# Patient Record
Sex: Male | Born: 1940 | Race: White | Hispanic: No | Marital: Married | Smoking: Former smoker
Health system: Southern US, Community
[De-identification: ages and names within clinical notes are randomized; demographics above are authoritative.]

## PROBLEM LIST (undated history)

## (undated) DIAGNOSIS — C801 Malignant (primary) neoplasm, unspecified: Secondary | ICD-10-CM

## (undated) DIAGNOSIS — Z860100 Personal history of colon polyps, unspecified: Secondary | ICD-10-CM

## (undated) DIAGNOSIS — T7840XA Allergy, unspecified, initial encounter: Secondary | ICD-10-CM

## (undated) DIAGNOSIS — T8859XA Other complications of anesthesia, initial encounter: Secondary | ICD-10-CM

## (undated) DIAGNOSIS — T4145XA Adverse effect of unspecified anesthetic, initial encounter: Secondary | ICD-10-CM

## (undated) DIAGNOSIS — H269 Unspecified cataract: Secondary | ICD-10-CM

## (undated) DIAGNOSIS — M1990 Unspecified osteoarthritis, unspecified site: Secondary | ICD-10-CM

## (undated) DIAGNOSIS — K219 Gastro-esophageal reflux disease without esophagitis: Secondary | ICD-10-CM

## (undated) DIAGNOSIS — E0590 Thyrotoxicosis, unspecified without thyrotoxic crisis or storm: Secondary | ICD-10-CM

## (undated) DIAGNOSIS — IMO0002 Reserved for concepts with insufficient information to code with codable children: Secondary | ICD-10-CM

## (undated) DIAGNOSIS — M21619 Bunion of unspecified foot: Secondary | ICD-10-CM

## (undated) DIAGNOSIS — M2170 Unequal limb length (acquired), unspecified site: Secondary | ICD-10-CM

## (undated) DIAGNOSIS — E785 Hyperlipidemia, unspecified: Secondary | ICD-10-CM

## (undated) DIAGNOSIS — M7750 Other enthesopathy of unspecified foot: Secondary | ICD-10-CM

## (undated) DIAGNOSIS — M719 Bursopathy, unspecified: Secondary | ICD-10-CM

## (undated) DIAGNOSIS — I10 Essential (primary) hypertension: Secondary | ICD-10-CM

## (undated) DIAGNOSIS — M67919 Unspecified disorder of synovium and tendon, unspecified shoulder: Secondary | ICD-10-CM

## (undated) HISTORY — DX: Other enthesopathy of unspecified foot and ankle: M77.50

## (undated) HISTORY — DX: Bursopathy, unspecified: M71.9

## (undated) HISTORY — PX: JOINT REPLACEMENT: SHX530

## (undated) HISTORY — PX: TONSILLECTOMY: SUR1361

## (undated) HISTORY — DX: Unequal limb length (acquired), unspecified site: M21.70

## (undated) HISTORY — DX: Reserved for concepts with insufficient information to code with codable children: IMO0002

## (undated) HISTORY — DX: Allergy, unspecified, initial encounter: T78.40XA

## (undated) HISTORY — PX: EYE SURGERY: SHX253

## (undated) HISTORY — DX: Bunion of unspecified foot: M21.619

## (undated) HISTORY — DX: Gastro-esophageal reflux disease without esophagitis: K21.9

## (undated) HISTORY — PX: KNEE ARTHROSCOPY: SUR90

## (undated) HISTORY — PX: HERNIA REPAIR: SHX51

## (undated) HISTORY — DX: Unspecified disorder of synovium and tendon, unspecified shoulder: M67.919

## (undated) HISTORY — DX: Hyperlipidemia, unspecified: E78.5

## (undated) HISTORY — PX: APPENDECTOMY: SHX54

## (undated) HISTORY — DX: Unspecified cataract: H26.9

---

## 1979-08-14 DIAGNOSIS — E0590 Thyrotoxicosis, unspecified without thyrotoxic crisis or storm: Secondary | ICD-10-CM

## 1979-08-14 HISTORY — DX: Thyrotoxicosis, unspecified without thyrotoxic crisis or storm: E05.90

## 2002-08-01 ENCOUNTER — Encounter (INDEPENDENT_AMBULATORY_CARE_PROVIDER_SITE_OTHER): Payer: Self-pay | Admitting: *Deleted

## 2002-08-01 ENCOUNTER — Ambulatory Visit (HOSPITAL_COMMUNITY): Admission: RE | Admit: 2002-08-01 | Discharge: 2002-08-01 | Payer: Self-pay | Admitting: Gastroenterology

## 2003-06-20 ENCOUNTER — Encounter: Payer: Self-pay | Admitting: Family Medicine

## 2003-09-30 ENCOUNTER — Encounter (INDEPENDENT_AMBULATORY_CARE_PROVIDER_SITE_OTHER): Payer: Self-pay | Admitting: Specialist

## 2003-09-30 ENCOUNTER — Ambulatory Visit (HOSPITAL_COMMUNITY): Admission: RE | Admit: 2003-09-30 | Discharge: 2003-09-30 | Payer: Self-pay | Admitting: Gastroenterology

## 2006-12-13 HISTORY — PX: PARTIAL COLECTOMY: SHX5273

## 2006-12-13 HISTORY — PX: COLON SURGERY: SHX602

## 2007-02-03 ENCOUNTER — Encounter (INDEPENDENT_AMBULATORY_CARE_PROVIDER_SITE_OTHER): Payer: Self-pay | Admitting: Specialist

## 2007-02-03 ENCOUNTER — Ambulatory Visit (HOSPITAL_COMMUNITY): Admission: RE | Admit: 2007-02-03 | Discharge: 2007-02-03 | Payer: Self-pay | Admitting: Gastroenterology

## 2007-03-31 ENCOUNTER — Inpatient Hospital Stay (HOSPITAL_COMMUNITY): Admission: RE | Admit: 2007-03-31 | Discharge: 2007-04-05 | Payer: Self-pay | Admitting: General Surgery

## 2007-03-31 ENCOUNTER — Encounter (INDEPENDENT_AMBULATORY_CARE_PROVIDER_SITE_OTHER): Payer: Self-pay | Admitting: Specialist

## 2007-10-25 ENCOUNTER — Emergency Department (HOSPITAL_COMMUNITY): Admission: EM | Admit: 2007-10-25 | Discharge: 2007-10-25 | Payer: Self-pay | Admitting: Family Medicine

## 2007-11-04 ENCOUNTER — Emergency Department (HOSPITAL_COMMUNITY): Admission: EM | Admit: 2007-11-04 | Discharge: 2007-11-04 | Payer: Self-pay | Admitting: Family Medicine

## 2007-11-05 ENCOUNTER — Emergency Department (HOSPITAL_COMMUNITY): Admission: EM | Admit: 2007-11-05 | Discharge: 2007-11-05 | Payer: Self-pay | Admitting: Family Medicine

## 2008-01-10 LAB — HM COLONOSCOPY: HM Colonoscopy: NORMAL

## 2008-04-05 ENCOUNTER — Encounter (INDEPENDENT_AMBULATORY_CARE_PROVIDER_SITE_OTHER): Payer: Self-pay | Admitting: Gastroenterology

## 2008-04-05 ENCOUNTER — Ambulatory Visit (HOSPITAL_COMMUNITY): Admission: RE | Admit: 2008-04-05 | Discharge: 2008-04-05 | Payer: Self-pay | Admitting: Gastroenterology

## 2008-09-16 ENCOUNTER — Ambulatory Visit: Payer: Self-pay | Admitting: Sports Medicine

## 2008-09-16 DIAGNOSIS — M21619 Bunion of unspecified foot: Secondary | ICD-10-CM | POA: Insufficient documentation

## 2008-09-16 DIAGNOSIS — M25569 Pain in unspecified knee: Secondary | ICD-10-CM | POA: Insufficient documentation

## 2008-09-16 DIAGNOSIS — M2170 Unequal limb length (acquired), unspecified site: Secondary | ICD-10-CM | POA: Insufficient documentation

## 2008-09-16 HISTORY — DX: Unequal limb length (acquired), unspecified site: M21.70

## 2008-09-16 HISTORY — DX: Bunion of unspecified foot: M21.619

## 2008-10-17 ENCOUNTER — Ambulatory Visit: Payer: Self-pay | Admitting: Sports Medicine

## 2008-10-17 DIAGNOSIS — IMO0002 Reserved for concepts with insufficient information to code with codable children: Secondary | ICD-10-CM | POA: Insufficient documentation

## 2008-10-17 HISTORY — DX: Reserved for concepts with insufficient information to code with codable children: IMO0002

## 2008-11-08 ENCOUNTER — Encounter: Payer: Self-pay | Admitting: Family Medicine

## 2008-12-17 ENCOUNTER — Ambulatory Visit: Payer: Self-pay | Admitting: Sports Medicine

## 2009-02-14 ENCOUNTER — Encounter: Payer: Self-pay | Admitting: Family Medicine

## 2009-05-30 DIAGNOSIS — E785 Hyperlipidemia, unspecified: Secondary | ICD-10-CM

## 2009-05-30 HISTORY — DX: Hyperlipidemia, unspecified: E78.5

## 2009-06-23 ENCOUNTER — Telehealth: Payer: Self-pay | Admitting: Family Medicine

## 2009-08-12 ENCOUNTER — Encounter: Payer: Self-pay | Admitting: Family Medicine

## 2009-08-13 ENCOUNTER — Encounter: Payer: Self-pay | Admitting: Family Medicine

## 2009-09-25 ENCOUNTER — Ambulatory Visit: Payer: Self-pay | Admitting: Family Medicine

## 2009-12-18 ENCOUNTER — Ambulatory Visit: Payer: Self-pay | Admitting: Family Medicine

## 2009-12-25 ENCOUNTER — Ambulatory Visit: Payer: Self-pay | Admitting: Family Medicine

## 2010-01-23 ENCOUNTER — Ambulatory Visit: Payer: Self-pay | Admitting: Family Medicine

## 2010-01-23 DIAGNOSIS — B9789 Other viral agents as the cause of diseases classified elsewhere: Secondary | ICD-10-CM | POA: Insufficient documentation

## 2010-01-27 ENCOUNTER — Ambulatory Visit: Payer: Self-pay | Admitting: Sports Medicine

## 2010-01-27 DIAGNOSIS — M719 Bursopathy, unspecified: Secondary | ICD-10-CM

## 2010-01-27 DIAGNOSIS — M67919 Unspecified disorder of synovium and tendon, unspecified shoulder: Secondary | ICD-10-CM | POA: Insufficient documentation

## 2010-01-27 DIAGNOSIS — M7750 Other enthesopathy of unspecified foot: Secondary | ICD-10-CM

## 2010-01-27 HISTORY — DX: Unspecified disorder of synovium and tendon, unspecified shoulder: M67.919

## 2010-01-27 HISTORY — DX: Other enthesopathy of unspecified foot and ankle: M77.50

## 2010-03-10 ENCOUNTER — Ambulatory Visit: Payer: Self-pay | Admitting: Sports Medicine

## 2010-06-23 ENCOUNTER — Ambulatory Visit: Payer: Self-pay | Admitting: Family Medicine

## 2010-06-30 LAB — CONVERTED CEMR LAB
Cholesterol: 165 mg/dL (ref 0–200)
HDL: 47.1 mg/dL (ref >39.00)
LDL Cholesterol: 90 mg/dL (ref 0–99)
Total CHOL/HDL Ratio: 4
Triglycerides: 138 mg/dL (ref 0.0–149.0)
VLDL: 27.6 mg/dL (ref 0.0–40.0)

## 2010-09-17 ENCOUNTER — Encounter: Payer: Self-pay | Admitting: Family Medicine

## 2010-09-24 ENCOUNTER — Encounter: Payer: Self-pay | Admitting: Family Medicine

## 2010-12-13 HISTORY — PX: HAMMER TOE SURGERY: SHX385

## 2010-12-29 ENCOUNTER — Ambulatory Visit
Admission: RE | Admit: 2010-12-29 | Discharge: 2010-12-29 | Payer: Self-pay | Source: Home / Self Care | Attending: Family Medicine | Admitting: Family Medicine

## 2010-12-29 ENCOUNTER — Other Ambulatory Visit: Payer: Self-pay | Admitting: Family Medicine

## 2010-12-29 DIAGNOSIS — K219 Gastro-esophageal reflux disease without esophagitis: Secondary | ICD-10-CM | POA: Insufficient documentation

## 2010-12-29 HISTORY — DX: Gastro-esophageal reflux disease without esophagitis: K21.9

## 2010-12-29 LAB — HEPATIC FUNCTION PANEL
ALT: 33 U/L (ref 0–53)
AST: 28 U/L (ref 0–37)
Albumin: 4.3 g/dL (ref 3.5–5.2)
Alkaline Phosphatase: 72 U/L (ref 39–117)
Bilirubin, Direct: 0.2 mg/dL (ref 0.0–0.3)
Total Bilirubin: 1.1 mg/dL (ref 0.3–1.2)
Total Protein: 7.4 g/dL (ref 6.0–8.3)

## 2010-12-29 LAB — LIPID PANEL
Cholesterol: 182 mg/dL (ref 0–200)
HDL: 47.2 mg/dL (ref >39.00)
LDL Cholesterol: 108 mg/dL — ABNORMAL HIGH (ref 0–99)
Total CHOL/HDL Ratio: 4
Triglycerides: 136 mg/dL (ref 0.0–149.0)
VLDL: 27.2 mg/dL (ref 0.0–40.0)

## 2010-12-29 LAB — CONVERTED CEMR LAB
Cholesterol, target level: 200 mg/dL
HDL goal, serum: 40 mg/dL
LDL Goal: 160 mg/dL

## 2010-12-29 LAB — BASIC METABOLIC PANEL
BUN: 16 mg/dL (ref 6–23)
CO2: 28 mEq/L (ref 19–32)
Calcium: 9.4 mg/dL (ref 8.4–10.5)
Chloride: 105 mEq/L (ref 96–112)
Creatinine, Ser: 0.8 mg/dL (ref 0.4–1.5)
GFR: 104.96 mL/min (ref >60.00)
Glucose, Bld: 96 mg/dL (ref 70–99)
Potassium: 4.4 mEq/L (ref 3.5–5.1)
Sodium: 142 mEq/L (ref 135–145)

## 2010-12-29 LAB — PSA: PSA: 1.35 ng/mL (ref 0.10–4.00)

## 2011-01-10 LAB — CONVERTED CEMR LAB
ALT: 30 units/L (ref 0–53)
AST: 34 units/L (ref 0–37)
Albumin: 4 g/dL (ref 3.5–5.2)
Alkaline Phosphatase: 71 units/L (ref 39–117)
BUN: 15 mg/dL (ref 6–23)
Basophils Absolute: 0 10*3/uL (ref 0.0–0.1)
Basophils Relative: 0.9 % (ref 0.0–3.0)
Bilirubin Urine: NEGATIVE
Bilirubin, Direct: 0 mg/dL (ref 0.0–0.3)
Blood in Urine, dipstick: NEGATIVE
CO2: 30 meq/L (ref 19–32)
Calcium: 9.3 mg/dL (ref 8.4–10.5)
Chloride: 108 meq/L (ref 96–112)
Cholesterol: 173 mg/dL (ref 0–200)
Creatinine, Ser: 0.9 mg/dL (ref 0.4–1.5)
Eosinophils Absolute: 0.2 10*3/uL (ref 0.0–0.7)
Eosinophils Relative: 4.9 % (ref 0.0–5.0)
GFR calc non Af Amer: 89.26 mL/min (ref >60)
Glucose, Bld: 102 mg/dL — ABNORMAL HIGH (ref 70–99)
Glucose, Urine, Semiquant: NEGATIVE
HCT: 46.6 % (ref 39.0–52.0)
HDL: 51.6 mg/dL (ref >39.00)
Hemoglobin: 15.1 g/dL (ref 13.0–17.0)
Ketones, urine, test strip: NEGATIVE
LDL Cholesterol: 108 mg/dL — ABNORMAL HIGH (ref 0–99)
Lymphocytes Relative: 31.1 % (ref 12.0–46.0)
Lymphs Abs: 1.5 10*3/uL (ref 0.7–4.0)
MCHC: 32.4 g/dL (ref 30.0–36.0)
MCV: 96.2 fL (ref 78.0–100.0)
Monocytes Absolute: 0.5 10*3/uL (ref 0.1–1.0)
Monocytes Relative: 11 % (ref 3.0–12.0)
Neutro Abs: 2.6 10*3/uL (ref 1.4–7.7)
Neutrophils Relative %: 52.1 % (ref 43.0–77.0)
Nitrite: NEGATIVE
PSA: 0.7 ng/mL (ref 0.10–4.00)
Platelets: 162 10*3/uL (ref 150.0–400.0)
Potassium: 5 meq/L (ref 3.5–5.1)
RBC: 4.84 M/uL (ref 4.22–5.81)
RDW: 12.9 % (ref 11.5–14.6)
Sodium: 142 meq/L (ref 135–145)
Specific Gravity, Urine: 1.015
TSH: 2.23 microintl units/mL (ref 0.35–5.50)
Total Bilirubin: 1 mg/dL (ref 0.3–1.2)
Total CHOL/HDL Ratio: 3
Total Protein: 7.1 g/dL (ref 6.0–8.3)
Triglycerides: 67 mg/dL (ref 0.0–149.0)
Urobilinogen, UA: 0.2
VLDL: 13.4 mg/dL (ref 0.0–40.0)
WBC Urine, dipstick: NEGATIVE
WBC: 4.8 10*3/uL (ref 4.5–10.5)
pH: 7

## 2011-01-12 NOTE — Consult Note (Signed)
Summary: [facility]  [facility]   Imported By: Maryln Gottron 09/24/2010 14:55:44  _____________________________________________________________________  External Attachment:    Type:   Image     Comment:   External Document

## 2011-01-12 NOTE — Assessment & Plan Note (Signed)
Summary: ORTHOTICS AND SHOULDER/MJD   Vital Signs:  Patient profile:   70 year old male BP sitting:   158 / 83  Vitals Entered By: Lillia Pauls CMA (March 10, 2010 9:22 AM)  History of Present Illness: Pt presents for follow-up of bilateral metatarsalgia under the 2nd and 3rd toes in addition to his right rotator cuff tendinitis. Overall, he has done very well with the metatarsal pads in his loafers and is no longer having significant pain. However, he likes to go to the gym 2-4 times per week where he mainly does walking and elliptical work. He does get his metatarsalgia symptoms at times during his work-outs and is here for custom orthotic fabrication.  His right shoulder pain has also improved. He does not have to take regular mediations and is able to do most of the rehab exercises without difficulty. He still has some pain with putting his right hand behind his back. He does not have to take regular medications for pain. He can sleep comfortably.  Allergies: 1)  ! Codeine Sulfate (Codeine Sulfate)  Physical Exam  General:  alert and well-developed.   Head:  normocephalic and atraumatic.   Neck:  supple.   Lungs:  normal respiratory effort.   Msk:  RIght Shoulder: Normal inspection Full ROM with forward flexion and abduction Neg drop arm sign Neg empty can, cross over, Neer's and Speed's test + Hawkin's test No TTP throughout 5/5 strength with resisted internal and external rotation Can put hand behind back to L3  Left Shoulder: Normal inspection full ROM with forward flexion and abduction No TTP throughout Neg special testing 5/5 strength with resisted internal and external rotation Can put hand behind back to T12  Feet: Bunion on left greater than right Bilateral bunionettes Pes cavus with midfoot collapse bilaterally Transverse arch breakdown Prominant calluses below 2nd and 3rd toes bilaterally with palpable MT heads under calluses   Impression &  Recommendations:  Problem # 1:  METATARSALGIA (ICD-726.70) Assessment Improved  Improved with medium MT pads in loafers Fitted for custom orthotics today to help with metatarsalgia and bunions  Patient was fitted for a : standard, cushioned, semi-rigid orthotic. The orthotic was heated and afterward the patient stood on the orthotic blank positioned on the orthotic stand. The patient was positioned in subtalar neutral position and 10 degrees of ankle dorsiflexion in a weight bearing stance. After completion of molding, a stable base was applied to the orthotic blank. The blank was ground to a stable position for weight bearing. 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 One tab every other day  (trial 6 months) 2)  Nexium 40 Mg Cpdr (Esomeprazole magnesium) .... Once daily 3)  Aspirin 81 Mg Tabs (Aspirin) .... Once daily 4)  Daily Multiple Vitamins Tabs (Multiple vitamin) .... Once daily  Appended Document: Preload-Flu Vaccine     Immunization History:  Influenza Immunization History:    Influenza:  historical (09/11/2010)    Immunization History:  Influenza Immunization History:    Influenza:  historical (09/11/2010)

## 2011-01-12 NOTE — Assessment & Plan Note (Signed)
Summary: SHOULDER/FEET [facility]IN[facility]   Vital Signs:  Patient profile:   70 year old male BP sitting:   155 / 90  Vitals Entered By: Lillia Pauls CMA (January 27, 2010 10:22 AM)  History of Present Illness: Pt presents with right shoulder pain and bilateral foot pain since September of 2010. He retired back in April of 2010 and joined [employer] where he does multiple fitness classes per week in which he uses light dumbells during aerobic training. He has had right anterior shoulder pain now when he reaches behind his back. The pain that occurs shoots down his right biceps. The pain is not usually there except when he uses the dumbells with his arms fully extended or when he reaches behind his back. He has not used any medications or heat for this pain.  He is also having bilateral foot pain underneath his 2nd and 3rd toes bilaterally. He has been given sports insoles in the past with metatarsal pads which are very comfortable. He has those inserts in his work-out shoes. However, he usually wears loafers during the day which are fairly comfortable. His pain has been returning though and he is hoping to remain active as comfortably as possible.   Allergies: 1)  ! Codeine Sulfate (Codeine Sulfate)  Physical Exam  General:  alert and well-developed.   Head:  normocephalic and atraumatic.   Neck:  supple.   Lungs:  normal respiratory effort.   Msk:  Right Shoulder: No bony abnormalities, edema or bruising Full ROM with forward flexion and abduction Pain with reaching behind his back but can get to L4 Neg empty can, cross over, Hawkin's, Neer's  Slightly positive speeds + TTP over the biceps tendon and along anterior shoulder 5/5 strength with resisted internal and external rotation  Left Shoulder: No bony abnormalities, edema or bruising Full ROM with forward flexion and abduction Can reach easily behind his back to L1 Neg empty can, cross over, Hawkin's, Neer's, Speed's  No TTP  throughout 5/5 strength with resisted internal and external rotation  Feet: Bunion on left greater than right Bilateral bunionettes Pes cavus with midfoot collapse bilaterally Transverse arch breakdown Prominant calluses below 2nd and 3rd toes bilaterally with palpable MT heads under calluses   Impression & Recommendations:  Problem # 1:  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 Given multiple exercises to do daily to help with pain from rotator cuff tendinitis 2. Can ice shoulder at the end of the day as needed for 20 minutes 3. Can use OTC NSAID as needed for pain 4. Return in 4 weeks for follow-up  Problem # 2:  METATARSALGIA (ICD-726.70) Likley cause of his bilateral foot pain 1. Placed medium MT pads in his bilataral dress shoes today in the office which he felt were comfortable 2. Would bring him back for custom orthotics since he is working out on a regular basis and developing foot pain more regularly  Problem # 3:  BUNION, LEFT FOOT (ICD-727.1) 1. Suggesting custom orthotics to prevent worsening of his left bunion  Complete Medication List: 1)  Lipitor 20 Mg Tabs (Atorvastatin calcium) .... One tab every other day  (trial 6 months) 2)  Nexium 40 Mg Cpdr (Esomeprazole magnesium) .... Once daily 3)  Aspirin 81 Mg Tabs (Aspirin) .... Once daily 4)  Daily Multiple Vitamins Tabs (Multiple vitamin) .... Once daily

## 2011-01-12 NOTE — Letter (Signed)
Summary: [facility]  [facility]   Imported By: Maryln Gottron 10/07/2010 10:38:11  _____________________________________________________________________  External Attachment:    Type:   Image     Comment:   External Document

## 2011-01-12 NOTE — Assessment & Plan Note (Signed)
Summary: cpx/njr  [facility] BMP/NJR   Vital Signs:  Patient profile:   70 year old male Height:      70.5 inches Weight:      183 pounds Temp:     98.4 degrees F oral Pulse rate:   80 / minute Pulse rhythm:   regular BP sitting:   140 / 82  (left arm) Cuff size:   regular  Vitals Entered By: Sid Falcon LPN (December 25, 2009 10:26 AM) CC: CPX, labs done   History of Present Illness: Patient here for complete physical examination. Has hyperlipidemia and GERD history. Symptoms well-controlled.   Had colonoscopy 2008 and due for repeat in 2 years.  Has received flu vaccine. Pneumovax up-to-date. Tetanus up to date. Exercising 4 days per week.  Some weight loss this past year due to his efforts.  History of metatarsalgia. He has orthotic inserts which are helping some. Family history and social history reviewed. Patient is retired from [employer].  Allergies: 1)  ! Codeine Sulfate (Codeine Sulfate)  Past History:  Past Surgical History: Last updated: 09/25/2009 Colonectomy 2008  Family History: Last updated: 12/25/2009 Family History of Colon CA  Father in his 8s. 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 Family History High cholesterol Family History of Prostate CA   Review of Systems  The patient denies anorexia, fever, weight loss, weight gain, vision loss, decreased hearing, hoarseness, chest pain, syncope, dyspnea on exertion, peripheral edema, prolonged cough, headaches, hemoptysis, abdominal pain, melena, hematochezia, severe indigestion/heartburn, hematuria, incontinence, muscle  weakness, suspicious skin lesions, transient blindness, difficulty walking, depression, unusual weight change, abnormal bleeding, enlarged lymph nodes, and testicular masses.    Physical Exam  General:  Well-developed,well-nourished,in no acute distress; alert,appropriate and cooperative throughout examination Head:  Normocephalic and atraumatic without obvious abnormalities. No apparent alopecia or balding. Eyes:  No corneal or conjunctival inflammation noted. EOMI. Perrla. Funduscopic exam benign, without hemorrhages, exudates or papilledema. Vision grossly normal. Ears:  External ear exam shows no significant lesions or deformities.  Otoscopic examination reveals clear canals, tympanic membranes are intact bilaterally without bulging, retraction, inflammation or discharge. Hearing is grossly normal bilaterally. Nose:  External nasal examination shows no deformity or inflammation. Nasal mucosa are pink and moist without lesions or exudates. Mouth:  Oral mucosa and oropharynx without lesions or exudates.  Teeth in good repair. Neck:  No deformities, masses, or tenderness noted. Lungs:  Normal respiratory effort, chest expands symmetrically. Lungs are clear to auscultation, no crackles or wheezes. Heart:  Normal rate and regular rhythm. S1 and S2 normal without gallop, murmur, click, rub or other extra sounds. Abdomen:  Bowel sounds positive,abdomen soft and non-tender without masses, organomegaly or hernias noted. Rectal:  No external abnormalities noted. Normal sphincter tone. No rectal masses or tenderness. Prostate:  Prostate gland firm and smooth, no enlargement, nodularity, tenderness, mass, asymmetry or induration. Msk:  No deformity or scoliosis noted of thoracic or lumbar spine.   Extremities:  No clubbing, cyanosis, edema, or deformity noted with normal full range of motion of all joints.   Neurologic:  No cranial nerve deficits noted. Station and gait are normal. Plantar reflexes are  down-going bilaterally.  DTRs are symmetrical throughout. Sensory, motor and coordinative functions appear intact. Skin:  Intact without suspicious lesions or rashes   Impression & Recommendations:  Problem # 1:  Preventive Health Care (ICD-V70.0) continue regular exercise. Labs reviewed with patient including lipoprotein profile. At this point he'll continue Lipitor and baby aspirin one daily  Complete Medication List: 1)  Lipitor 20 Mg Tabs (Atorvastatin calcium) .... Once daily 2)  Nexium 40 Mg Cpdr (Esomeprazole magnesium) .... Once daily 3)  Aspirin 81 Mg Tabs (Aspirin) .... Once daily 4)  Daily Multiple Vitamins Tabs (Multiple vitamin) .... Once daily  Patient Instructions: 1)  It is important that you exercise reguarly at least 20 minutes 5 times a week. If you develop chest pain, have severe difficulty breathing, or feel very tired, stop exercising immediately and seek medical attention.  2)  Check your  Blood Pressure regularly . If it is above: 140/90  you should make an appointment. Prescriptions: LIPITOR 20 MG TABS (ATORVASTATIN CALCIUM) once daily  #30 x 11   Entered and Authorized by:   Evelena Peat MD   Signed by:   Evelena Peat MD on 12/25/2009   Method used:   Electronically to        [facility]* (retail)       [address] [place], [place]  [address]       Ph: [phone] or [phone]       Fax: [phone]   RxID:   [id number]    Immunization History:  Influenza Immunization History:    Influenza:  historical (09/12/2008)

## 2011-01-12 NOTE — Assessment & Plan Note (Signed)
Summary: fever and URI/dm   Vital Signs:  Patient profile:   70 year old male Temp:     98.4 degrees F oral BP sitting:   140 / 80  (left arm) Cuff size:   regular  Vitals Entered By: Sid Falcon LPN (January 23, 2010 11:54 AM) CC: Fever, body aches, congestion X 2 days   History of Present Illness: Acute visit. Onset Wednesday night of fevers up to 102 and diffuse body aches. Started with nasal congestion now has some chest congestion as well with mostly nonproductive cough. Denies any nausea, vomiting, or diarrhea. No significant headaches. Intermittent chills. Had flu vaccine.  Allergies: 1)  ! Codeine Sulfate (Codeine Sulfate)  Past History:  Past Medical History: Last updated: 12/25/2009 GERD (on nexium) Dyslipidemia (on lipitor) Seasonal allergies Adenomatous colon polyps Thyroiditis PMH reviewed for relevance  Review of Systems      See HPI  Physical Exam  General:  Well-developed,well-nourished,in no acute distress; alert,appropriate and cooperative throughout examination Ears:  External ear exam shows no significant lesions or deformities.  Otoscopic examination reveals clear canals, tympanic membranes are intact bilaterally without bulging, retraction, inflammation or discharge. Hearing is grossly normal bilaterally. Nose:  External nasal examination shows no deformity or inflammation. Nasal mucosa are pink and moist without lesions or exudates. Mouth:  Oral mucosa and oropharynx without lesions or exudates.  Teeth in good repair. Neck:  No deformities, masses, or tenderness noted. Lungs:  Normal respiratory effort, chest expands symmetrically. Lungs are clear to auscultation, no crackles or wheezes. Heart:  normal rate, regular rhythm, and no murmur.   Skin:  no rash   Impression & Recommendations:  Problem # 1:  VIRAL INFECTION (ICD-079.99) treat symptomatically. Continued Advil. Patient will also add Mucinex and over-the-counter Sudafed for nasal  congestive symptoms His updated medication list for this problem includes:    Aspirin 81 Mg Tabs (Aspirin) ..... Once daily  Complete Medication List: 1)  Lipitor 20 Mg Tabs (Atorvastatin calcium) .... One tab every other day  (trial 6 months) 2)  Nexium 40 Mg Cpdr (Esomeprazole magnesium) .... Once daily 3)  Aspirin 81 Mg Tabs (Aspirin) .... Once daily 4)  Daily Multiple Vitamins Tabs (Multiple vitamin) .... Once daily   Immunization History:  Influenza Immunization History:    Influenza:  historical (09/12/2009)

## 2011-01-14 NOTE — Assessment & Plan Note (Signed)
Summary: emp---will fast//ccm   Vital Signs:  Patient profile:   70 year old male Height:      70.5 inches Weight:      186 pounds BMI:     26.41 Temp:     97.7 degrees F oral Pulse rate:   72 / minute Pulse rhythm:   regular Resp:     12 per minute BP sitting:   120 / 90  (left arm) Cuff size:   regular  Vitals Entered By: Sid Falcon LPN (December 29, 2010 8:54 AM)  Nutrition Counseling: Patient's BMI is greater than 25 and therefore counseled on weight management options.  History of Present Illness: Here for CPE-Medicare wellness exam and follow up medical problems.  Here for Medicare AWV:  1.   Risk factors based on Past M, S, F history:  hx GERD, hyperlipidemia, adenomatous colon polyps.  Nonsmoker and no signif ETOH use.  FH father colon cancer and prostate cancer. 2.   Physical Activities: exercises regularly. 3.   Depression/mood: no depression or anxiety issues 4.   Hearing: no major defecits. 5.   ADL's: Fully independent. 6.   Fall Risk: very low.  Good balance.  No major orthopedic impairments.  no orthostasis or visual problems. 7.   Home Safety: no issues identified. 8.   Height, weight, &visual acuity:  Ht, WT, and vision stable. 9.   Counseling: continued exercise.  Immunization needs reviewed and up to date. 10.   Labs ordered based on risk factors: lipid, hepatic, BMP, PSA. 11.           Referral Coordination  none needed at this time. 12.           Care Plan  repeat colonoscopy later this year.  Labs as above.  immunizations up to date. 13.            Cognitive Assessment  No impairment in short or long term memory.  No impairment in decision making or judgement.   GERD symptoms stable.  would like to change from Nexium to generic.  No dysphagia.  Lipid Management History:      Positive NCEP/ATP III risk factors include male age 13 years old or older.  Negative NCEP/ATP III risk factors include non-diabetic, non-tobacco-user status, non-hypertensive, no  ASHD (atherosclerotic heart disease), no prior stroke/TIA, no peripheral vascular disease, and no history of aortic aneurysm.     Preventive Screening-Counseling & Management  Alcohol-Tobacco     Smoking Status: never  Clinical Review Panels:  Prevention   Last Colonoscopy:  normal (12/14/2007)   Last PSA:  0.70 (12/18/2009)  Immunizations   Last Tetanus Booster:  Historical (03/13/2006)   Last Flu Vaccine:  Historical (09/11/2010)   Last Pneumovax:  Historical (12/13/2006)   Last Zoster Vaccine:  Zostavax (12/13/2004)   Allergies: 1)  ! Codeine Sulfate (Codeine Sulfate)  Past History:  Past Medical History: Last updated: 12/25/2009 GERD (on nexium) Dyslipidemia (on lipitor) Seasonal allergies Adenomatous colon polyps Thyroiditis  Family History: Last updated: 12/29/2010 Family History of Colon CA  Father in his 1s. 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 Family History High cholesterol Family History of Prostate CA  Father  Social History: Smoking Status:  never  Review of Systems  The patient denies anorexia, fever, weight loss, weight gain, vision loss, decreased hearing, hoarseness, chest pain, syncope, dyspnea on exertion, peripheral edema, prolonged cough, headaches, hemoptysis, abdominal pain, melena, hematochezia, severe indigestion/heartburn, hematuria, incontinence, genital sores, muscle weakness, suspicious skin lesions, transient blindness, difficulty walking, depression, unusual weight change, abnormal bleeding, enlarged lymph nodes, and testicular masses.    Physical Exam  General:   Well-developed,well-nourished,in no acute distress; alert,appropriate and cooperative throughout examination Head:  Normocephalic and atraumatic without obvious abnormalities.  Eyes:  pupils equal, pupils round, and pupils reactive to light.   Ears:  External ear exam shows no significant lesions or deformities.  Otoscopic examination reveals clear canals, tympanic membranes are intact bilaterally without bulging, retraction, inflammation or discharge. Hearing is grossly normal bilaterally. Mouth:  Oral mucosa and oropharynx without lesions or exudates.  Teeth in good repair. Neck:  No deformities, masses, or tenderness noted. Lungs:  Normal respiratory effort, chest expands symmetrically. Lungs are clear to auscultation, no crackles or wheezes. Heart:  Normal rate and regular rhythm. S1 and S2 normal without gallop, murmur, click, rub or other extra sounds. Abdomen:  Bowel sounds positive,abdomen soft and non-tender without masses, organomegaly or hernias noted. Rectal:  No external abnormalities noted. Normal sphincter tone. No rectal masses or tenderness. Prostate:  Prostate gland firm and smooth, no enlargement, nodularity, tenderness, mass, asymmetry or induration. Msk:  No deformity or scoliosis noted of thoracic or lumbar spine.   Extremities:  Walking shoe R foot from recent hammer toe surgery. Neurologic:  No cranial nerve deficits noted. Station and gait are normal. Plantar reflexes are down-going bilaterally. DTRs are symmetrical throughout. Sensory, motor and coordinative functions appear intact. Skin:  no rashes and no suspicious lesions.   Cervical Nodes:  No lymphadenopathy noted Psych:  Cognition and judgment appear intact. Alert and cooperative with normal attention span and concentration. No apparent delusions, illusions, hallucinations   Impression & Recommendations:  Problem # 1:  ROUTINE GENERAL MEDICAL EXAM@HEALTH  CARE FACL (ICD-V70.0)  Orders: TLB-PSA (Prostate Specific  Antigen) (84153-PSA) TLB-BMP (Basic Metabolic Panel-BMET) (80048-METABOL) Specimen Handling ([address]) Venipuncture ([address]) Medicare -1st Annual Wellness Visit [phone])  Problem # 2:  HYPERLIPIDEMIA (ICD-272.4)  His updated medication list for this problem includes:    Lipitor 20 Mg Tabs (Atorvastatin calcium) ..... One tab every other day  (trial 6 months)  Orders: TLB-Lipid Panel (80061-LIPID) TLB-Hepatic/Liver Function Pnl (80076-HEPATIC) Specimen Handling (91478) Venipuncture (29562)  Problem # 3:  GERD (ICD-530.81)  His updated medication list for this problem includes:    Pantoprazole Sodium 40 Mg Tbec (Pantoprazole sodium) ..... One by mouth once daily  Complete Medication List: 1)  Lipitor 20 Mg Tabs (Atorvastatin calcium) .... One tab every other day  (trial 6 months) 2)  Pantoprazole Sodium 40 Mg Tbec (Pantoprazole sodium) .... One by mouth once daily 3)  Aspirin 81 Mg Tabs (Aspirin) .... Once daily 4)  Daily Multiple Vitamins Tabs (Multiple vitamin) .... Once daily 5)  Gnp Fish Oil 1200 Mg Cpdr (Omega-3 fatty acids) .... 2 tabs daily 6)  Ocuvite Adult 50+ Caps (Multiple vitamins-minerals) .... Once daily  Lipid Assessment/Plan:      Based on NCEP/ATP III, the patient's risk factor category is "0-1 risk factors".  The patient's lipid goals are as follows: Total cholesterol goal is 200; LDL cholesterol goal is 160; HDL cholesterol goal is 40; Triglyceride goal is 150.    Patient Instructions: 1)  Please schedule a  follow-up appointment in 1 year.  2)  It is important that you exercise reguarly at least 20 minutes 5 times a week. If you develop chest pain, have severe difficulty breathing, or feel very tired, stop exercising immediately and seek medical attention.  Prescriptions: LIPITOR 20 MG TABS (ATORVASTATIN CALCIUM) one tab every other day  (trial 6 months)  #30 x 3   Entered and Authorized by:   Evelena Peat MD   Signed by:   Evelena Peat MD on 12/29/2010    Method used:   Electronically to        [facility] [phone]* (retail)       [address] [place], [place]  [address]       Ph: [phone]       Fax: [phone]   RxID:   [id number] PANTOPRAZOLE SODIUM 40 MG TBEC (PANTOPRAZOLE SODIUM) one by mouth once daily  #30 x 11   Entered and Authorized by:   Evelena Peat MD   Signed by:   Evelena Peat MD on 12/29/2010   Method used:   Electronically to        [facility] [address] [phone]* (retail)       [address] [place], [place]  [address]       Ph: [phone]       Fax: [phone]   RxID:   [id number]     Orders Added: 1)  TLB-Lipid Panel [80061-LIPID] 2)  TLB-Hepatic/Liver Function Pnl [80076-HEPATIC] 3)  TLB-PSA (Prostate Specific Antigen) [84153-PSA] 4)  TLB-BMP (Basic Metabolic Panel-BMET) [80048-METABOL] 5)  Specimen Handling [99000] 6)  Venipuncture [32355] 7)  Medicare -1st Annual Wellness Visit [G0438] 8)  Est. Patient Level III [73220]

## 2011-02-18 ENCOUNTER — Other Ambulatory Visit: Payer: Self-pay | Admitting: Family Medicine

## 2011-02-18 DIAGNOSIS — E785 Hyperlipidemia, unspecified: Secondary | ICD-10-CM

## 2011-02-19 ENCOUNTER — Telehealth: Payer: Self-pay | Admitting: Family Medicine

## 2011-02-19 NOTE — Telephone Encounter (Signed)
Discussed med refills

## 2011-02-19 NOTE — Telephone Encounter (Signed)
Pt called to speak with Nurse ref medication.... Would not elaborate what the discrepancy was just that he wanted to speak with Dr Mar Daring nurse.... Pt can be reached at [phone].

## 2011-04-27 NOTE — Op Note (Signed)
NAME:  AKRAM, KISSICK NO.:  [id number]   MEDICAL RECORD NO.:  [id number]          PATIENT TYPE:  AMB   LOCATION:  ENDO                         FACILITY:  [facility]   PHYSICIAN:  Anselmo Rod, M.D.  DATE OF BIRTH:  1941-10-16   DATE OF PROCEDURE:  04/05/2008  DATE OF DISCHARGE:                               OPERATIVE REPORT   PROCEDURE:  Colonoscopy with cold biopsies x 2.   ENDOSCOPIST:  Anselmo Rod, M.D.   INSTRUMENT USED:  Pentax video colonoscope.   INDICATIONS FOR PROCEDURE:  A 70 year old white male with a history of  colon cancer in his father diagnosed in his late 44s and a personal  history of adenomatous polyps. 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   PREPROCEDURE PREPARATION:  Informed consent was procured from the  patient. The patient was fasted for 4 hours prior to the procedure and  prepped with 20 OsmoPrep pills the night of and 12 OsmoPrep pills the  morning of the procedure.  The risks and benefits of the procedure  including a 10% missed rate of cancer and polyp were discussed with the  patient as well.   PREPROCEDURE PHYSICAL:  VITAL SIGNS:  Stable.  NECK:  Supple.  CHEST:  Clear to auscultation.  CARDIAC:  S1 and S2 regular.  ABDOMEN:  Soft with normal bowel sounds.   DESCRIPTION OF PROCEDURE:  The patient was placed in the left lateral  decubitus position and sedated with 80 mcg of Fentanyl and 8 mg of  Versed given intravenously in slow incremental doses. Once the patient  was adequately sedated and maintained on low-flow oxygen and continuous  cardiac monitoring, the Pentax video colonoscope was advanced from the  rectum to the cecum.  Two small sessile polyps were removed from 80 cm  by cold biopsies x2.  Some nodular changes were noted at the  anastomosis, and biopsies of this area were done as well.  Sigmoid  diverticulosis was appreciated.  Retroflexion in the rectum revealed  small internal hemorrhoids.  The patient tolerated the procedure well  without immediate complications.   IMPRESSION:  1. Two small sessile polyps biopsied from the left colon at 80 cm.  2. Nodular mucosa biopsied from the anastomosis.  3. Patient status post right hemicolectomy.  Distal small bowel      appeared normal.  4. Sigmoid diverticulosis.  5. Small internal hemorrhoids.   RECOMMENDATIONS:  1. Await pathology results.  2. Repeat colonoscopy depending on pathology results.  3. Avoid all nonsteroidals including aspirin for the next 2 weeks.  4. High-fiber diet with liberal fluid intake has been advocated.  5. Outpatient followup as need arises in the future.      Anselmo Rod, M.D.  Electronically Signed     JNM/MEDQ  D:  04/05/2008  T:  04/05/2008  Job:  [id number]   cc:   Evelena Peat, M.D.   Adolph Pollack, M.D.  [address]  [place]  [place] [address]

## 2011-04-30 NOTE — Op Note (Signed)
NAME:  CAMILO, MANDER NO.:  [id number]   MEDICAL RECORD NO.:  [id number]          PATIENT TYPE:  AMB   LOCATION:  ENDO                         FACILITY:  [facility]   PHYSICIAN:  Anselmo Rod, M.D.  DATE OF BIRTH:  27-Feb-1941   DATE OF PROCEDURE:  02/03/2007  DATE OF DISCHARGE:                               OPERATIVE REPORT   PROCEDURE PERFORMED:  Esophagogastroduodenoscopy with multiple cold  biopsies.   ENDOSCOPIST:  Anselmo Rod, MD   INSTRUMENT USED:  Pentax video panendoscope.   INDICATIONS FOR PROCEDURE:  A 70 year old white male with a history of  severe reflux and ongoing epigastric discomfort not responding to  Prilosec, undergoing EGD to rule out peptic ulcer disease, esophagitis,  gastritis, etc.   PREPROCEDURE PREPARATION:  Informed consent was procured from the  patient.  The patient was fasted for 4 hours prior to the procedure.  Risks and benefits of the procedure were discussed with the patient in  great detail.   PREPROCEDURE PHYSICAL:  VITAL SIGNS:  The patient had stable vital  signs.  NECK:  Supple.  CHEST:  Clear to auscultation.  S1 and S2 regular.  ABDOMEN:  Soft with normal bowel sounds.   DESCRIPTION OF PROCEDURE:  The patient was placed in the left lateral  decubitus position and sedated with 50 mcg of fentanyl and 6 mg of  Versed given intravenously in slow incremental doses. Once the patient  was adequately sedated and maintained on low-flow oxygen and continuous  cardiac monitoring, the Pentax video panendoscope was advanced through  the mouthpiece, over the tongue and into the esophagus under direct  vision. The entire esophagus was widely patent with no evidence of ring,  stricture, masses, or esophagitis. A small patch of pinkish mucosa was  seen above the Z-line and was biopsied to rule out Barrett's mucosa. The  scope was then advanced into the stomach and a 3- to 4-cm hiatal hernia  was seen on high retroflexion.   Antral gastritis was noted.  Antral  biopsies were done to rule out presence of H. pylori by Pathology.  The  proximal small bowel appeared normal.  There was no obstruction.  The  patient tolerated the procedure well without immediate complications.   IMPRESSION:  1.Normal-appearing esophagus except for a small patch of  pinkish mucosa biopsied above the Z-line to rule out Barrett's mucosa.  2.Antral gastritis, biopsies done for Helicobacter pylori.  3.A 3- to 4-cm hiatal hernia appreciated on high retroflexion.  4.Normal proximal small bowel.   RECOMMENDATIONS:  1.Await pathology results.  2.The patient has been given a prescription for Nexium to take 40 mg one  p.o. q.a.m.  3.Avoid all nonsteroidals for now.  4.Treat with antibiotics if H. pylori is present on biopsies.  5.Outpatient followup in the next 2 weeks or earlier if need be.      Anselmo Rod, M.D.  Electronically Signed     JNM/MEDQ  D:  02/03/2007  T:  02/03/2007  Job:  [id number]   cc:   Evelena Peat, M.D.  Adolph Pollack, M.D.

## 2011-04-30 NOTE — Op Note (Signed)
NAME:  Hector Lewis, Hector Lewis NO.:  [id number]   MEDICAL RECORD NO.:  [id number]                   PATIENT TYPE:  AMB   LOCATION:  ENDO                                 FACILITY:  [facility]   PHYSICIAN:  Charna Elizabeth, M.D.                   DATE OF BIRTH:  03/24/1941   DATE OF PROCEDURE:  08/01/2002  DATE OF DISCHARGE:                                 OPERATIVE REPORT   PROCEDURE:  Colonoscopy with snare polypectomy x6.   ENDOSCOPIST:  Charna Elizabeth, M.D.   INSTRUMENT USED:  Olympus video colonoscope.   INDICATIONS:  Occasional rectal bleeding with family history of colon cancer  in the father in a 75 year old white male.  Rule out colonic polyps, masses,  hemorrhoids, etc.   PREPROCEDURE PREPARATION:  Informed consent was procured from the patient.  The patient was fasted for eight hours prior to the procedure and prepped  with a bottle of magnesium citrate and a gallon of NuLytely the night prior  to the procedure.   PREPROCEDURE PHYSICAL:  VITAL SIGNS:  The patient had stable vital signs.  NECK:  Supple.  CHEST:  Clear to auscultation.  S1, S2 regular.  ABDOMEN:  Soft, nondistended, nontender, with normal bowel sounds.  No  hepatosplenomegaly.  No masses palpable.   DESCRIPTION OF PROCEDURE:  The patient was placed in the left lateral  decubitus position and sedated with 100 mg of Demerol and 10 mg of Versed  intravenously.  Once the patient was adequately sedate and maintained on low-  flow oxygen and continuous cardiac monitoring, the Olympus video colonoscope  was advanced from the rectum to the cecum.  The appendiceal orifice and the  ileocecal valve were clearly visualized and photographed.  A small sessile  polyp was snared from the right colon close to the IC valve.  A lobulated  flat lesion was removed piecemeal from the cecum carefully by snare  polypectomy x5.  There was evidence of some let-sided diverticulosis.  No  masses or hemorrhoids were  seen.  The patient tolerated the procedure well  without complications.   IMPRESSION:  1. Left-sided diverticulosis.  2. Small polyp snared from right colon close to ileocecal valve.  3. Flat lesion removed piecemeal from the cecum.   RECOMMENDATIONS:  1. Avoid all nonsteroidals including aspirin for the next four to six weeks.  2.     Await pathology results.  3. A soft, low-residue diet for the next three to four days.  4. Outpatient follow-up in the next two weeks.                                                   Charna Elizabeth, M.D.    JM/MEDQ  D:  08/01/2002  T:  08/03/2002  Job:  [id number]   cc:   Teena Irani. Arlyce Dice, M.D.

## 2011-04-30 NOTE — Op Note (Signed)
NAME:  DANZEL, MARSZALEK NO.:  [id number]   MEDICAL RECORD NO.:  [id number]          PATIENT TYPE:  AMB   LOCATION:  ENDO                         FACILITY:  [facility]   PHYSICIAN:  Anselmo Rod, M.D.  DATE OF BIRTH:  [birth year]   DATE OF PROCEDURE:  02/03/2007  DATE OF DISCHARGE:                               OPERATIVE REPORT   PROCEDURE PERFORMED:  Colonoscopy with snare polypectomy x3 and multiple  cold biopsies.   ENDOSCOPIST:  Anselmo Rod, M.D.   INSTRUMENT USED:  Pentax video colonoscope.   INDICATIONS FOR PROCEDURE:  A 70 year old white male with a personal  history of adenomatous polyps and a family history of colon cancer in  his father, undergoing a screening colonoscopy to rule out colonic  polyps, masses, etc.   PREPROCEDURE PREPARATION:  Informed consent was procured from the  patient. The patient fasted for 4 hours prior to the procedure and  prepped with 20 Osmoprep pills the night of and 12 Osmoprep pills the  morning of the procedure.  Risks and benefits of the procedure including  a 10% miss rate of cancer and polyp were discussed with the patient as  well.   PREPROCEDURE PHYSICAL:  VITAL SIGNS:  The patient had stable vital  signs.  NECK:  Supple.  CHEST:  Clear to auscultation.  CARDIAC:  S1 and S2 regular.  ABDOMEN:  Soft with normal bowel sounds.   DESCRIPTION OF PROCEDURE:  The patient was placed in left lateral  decubitus position and sedated with an additional 75 mcg of fentanyl and  6 mg of Versed given intravenously in slow incremental doses. Once the  patient was adequately sedated and maintained on low-flow oxygen and  continuous cardiac monitoring, the Pentax video colonoscope was advanced  from the rectum to the cecum.  There was some residual stool in the  colon.  Multiple washings were done.  A small sessile polyp was snared  from the mid right colon.  A lobulated mass was seen carpeting a portion  of the cecum.  This  was removed piecemeal x2 but as it seemed to cover a  significant portion of the cecal base, plans were to make a surgical  referral.  Three small sessile polyps were biopsied from the  rectosigmoid colon.  Small internal hemorrhoids were seen on  retroflexion.  The mass in the cecum was injected with saline prior to  the snare polypectomy done from that site at lower settings.   IMPRESSION:  1. Small nonbleeding internal hemorrhoids.  2. Three small sessile polyps biopsied from the rectosigmoid colon      (cold biopsies x3).  3. Small sessile polyp removed by hot snare from the mid right colon.  4. Lobulated mass in the cecum, two pieces removed by snare      polypectomy after injecting saline submucosally.   RECOMMENDATIONS:  1. Await pathology results.  2. A surgical evaluation has been planned with Dr. Avel Peace.  3. Avoid all nonsteroidals for now.  4. Outpatient follow-up in the next 2 weeks or earlier if need be.  Anselmo Rod, M.D.  Electronically Signed     JNM/MEDQ  D:  02/03/2007  T:  02/03/2007  Job:  [id number]   cc:   Evelena Peat, M.D.  Adolph Pollack, M.D.

## 2011-04-30 NOTE — Discharge Summary (Signed)
NAME:  Hector Lewis, VANCOTT NO.:  [id number]   MEDICAL RECORD NO.:  [id number]          PATIENT TYPE:  INP   LOCATION:  1534                         FACILITY:  [facility]   PHYSICIAN:  Adolph Pollack, M.D.DATE OF BIRTH:  11/22/1941   DATE OF ADMISSION:  03/31/2007  DATE OF DISCHARGE:  04/05/2007                               DISCHARGE SUMMARY   PRINCIPLE DISCHARGE DIAGNOSIS:  Tubulovillous adenoma of the cecum.   SECONDARY DIAGNOSES:  1. Mild postoperative ileus.  2. Gastroesophageal reflux.  3. Hyperlipidemia.   PROCEDURE:  Laparoscopic-assisted right colectomy.   REASON FOR ADMISSION:  This is a 70 year old male who underwent  colorectal cancer screening.  He had a villous-type lesion of the cecum  that could not be completely removed, and because of that and a family  history of cancer, he now presents for the above procedure.   HOSPITAL COURSE:  He underwent the above procedure which he tolerated  well.  He was up and walking and started on a clear liquid diet which  she tolerated.  His diet was slowly advanced.  He began passing gas.  His incision was clean and intact.  He will be discharged on post-op day  #5.   DISPOSITION:  Discharged to home April 05, 2007.   He was given discharge instructions.  He was to continue his home  medications.  He was given a prescription for Vicodin for pain and will  follow up in the office in 2-3 weeks.      Adolph Pollack, M.D.  Electronically Signed     TJR/MEDQ  D:  05/05/2007  T:  05/05/2007  Job:  [id number]   cc:   Evelena Peat, M.D.   Anselmo Rod, M.D.  Fax: [phone]

## 2011-04-30 NOTE — Op Note (Signed)
NAME:  FAHD, GALEA NO.:  [id number]   MEDICAL RECORD NO.:  [id number]                   PATIENT TYPE:  AMB   LOCATION:  ENDO                                 FACILITY:  [facility]   PHYSICIAN:  Anselmo Rod, M.D.               DATE OF BIRTH:  [birth year]   DATE OF PROCEDURE:  09/30/2003  DATE OF DISCHARGE:                                 OPERATIVE REPORT   PROCEDURE PERFORMED:  Colonoscopy with snare polypectomy x2 and cold  biopsies x1.   ENDOSCOPIST:  Anselmo Rod, M.D.   INSTRUMENT USED:  Olympus videocolonoscope.   INDICATION FOR THE PROCEDURE:  A 70 year old white male with a personal  history of a tubulovillous adenoma removed in the past, undergoing repeat  colonoscopy to rule out recurrent polyps.   PREPROCEDURE PREPARATION:  Informed consent was procured from the patient.  The patient was fasted for eight hours prior to the procedure and prepped  with a bottle of magnesium citrate and a gallon of GoLYTELY the night prior  to the procedure.   PREPROCEDURE PHYSICAL:  VITAL SIGNS:  Stable.  NECK:  Supple.  CHEST:  Clear to auscultation.  S1 and S2 regular.  ABDOMEN:  Soft with normal bowel sounds.   DESCRIPTION OF THE PROCEDURE:  The patient was placed in the left lateral  decubitus position and sedated with 80 mg of Demerol and 8 mg of Versed in  slow incremental doses.  Once the patient was adequately sedated and  maintained on low-flow oxygen and continuous cardiac monitoring, the Olympus  videocolonoscope was advanced from the rectum to the cecum and terminal  ileum without difficulty.  The appendiceal orifice and ileocecal valve were  clearly visualized and photographed.  Two small sessile polyps were snared  from 60 cm and another small sessile polyp was biopsied from 60 cm.  There  were a few scattered diverticula.  Retroflexion in the rectum revealed no  abnormalities.  The patient tolerated the procedure well and without  complications.   IMPRESSION:  1. Three polyps removed from 60 cm, two snared and one removed by cold     biopsy.  2. A few scattered diverticula seen in the colon.  3. Normal-appearing terminal ileum.   RECOMMENDATIONS:  1. Await pathology results.  2.     Avoid nonsteroidals including aspirin for the next 3-4 weeks.  3. High fiber diet with liberal fluid intake.  4. Outpatient followup in the next two weeks for further recommendations.                                               Anselmo Rod, M.D.    JNM/MEDQ  D:  09/30/2003  T:  09/30/2003  Job:  [id number]   cc:   Onalee Hua  Logan Bores, M.D.  [address]  [place]  [place] [address]  Fax: [phone]

## 2011-04-30 NOTE — Op Note (Signed)
NAME:  JABBAR, PALMERO NO.:  [id number]   MEDICAL RECORD NO.:  [id number]          PATIENT TYPE:  INP   LOCATION:  1605                         FACILITY:  [facility]   PHYSICIAN:  Adolph Pollack, M.D.DATE OF BIRTH:  [birth year]   DATE OF PROCEDURE:  03/31/2007  DATE OF DISCHARGE:                               OPERATIVE REPORT   PREOPERATIVE DIAGNOSIS:  Cecal neoplasm.   POSTOPERATIVE DIAGNOSIS:  Cecal neoplasm.   PROCEDURE:  Laparoscopic assisted right colectomy.   SURGEON:  Adolph Pollack, M.D.   ASSISTANT:  Alfonse Ras, M.D.   ANESTHESIA:  General.   INDICATIONS:  Mr. Pulsifer is a 70 year old male who underwent colorectal  screening.  He does have a history of colorectal cancer in the family.  A villous type lesion of the cecum was identified and biopsied and was  benign.  However, it could not be completely removed.  He now presents  for the above procedure.   TECHNIQUE:  He is seen in the holding area and brought to the operating  room, placed supine on the operating table and a general anesthetic was  administered.  A Foley catheter was placed.  The hair on the abdominal  wall was clipped and the abdominal wall was sterilely prepped and  draped.  A subumbilical small incision was made through the skin,  subcutaneous tissue, fascia and peritoneum entering the peritoneal  cavity.  A pursestring suture of 0 Vicryl was placed around the fascial  edges.  A Hassan trocar was introduced into the peritoneal cavity and a  pneumoperitoneum created by insufflation of CO2 gas.   The laparoscope was introduced.  An 11 mm trocar was placed in the left  upper quadrant and a 5 mm trocar placed in the right lower quadrant.  The appendices epiploica of the right colon was grasped and pulled  medially exposing the lateral attachments between the right colon and  the lateral abdominal wall.  These were divided with the harmonic  scalpel allowing me to medialize  the right colon.  I then approached the  hepatic flexure and was able to mobilize the hepatic flexure using the  harmonic scalpel, dividing some fatty and thin peritoneal attachments.  I then mobilized the proximal half of the transverse colon dividing thin  peritoneal attachments and sweeping it down the line for good  mobilization.  The distal ileum was mobile.  I had identified the  gonadal vessels and kept my plane of dissection anterior to them in the  right lower quadrant region.  I was able to grasp the mid transverse  colon and bring it down to the subumbilical area.   I then removed the subumbilical trocar and made a larger incision,  approximately 7-8 cm, down the lower midline through all layers.  I  grasped the terminal ileum, ascending colon, proximal third of the  transverse colon, and brought this up into the wound after placing a  wound protection device in.  I then divided the terminal ileum in the  proximal 1/3 and distal 2/3 junction of the transverse colon using the  GIA stapler.  The mesentery was then divided with the LigaSure and  vessels were clamped and tied.  The specimen was handed off the field  and opened up and the polypoid mass was noted.  It was sent to  pathology.   Gloves were changed.  I then performed a side-to-side stapled  anastomosis with the Endo-GIA stapler.  The common defect was closed in  two layers with running 3-0 Vicryl suture and then interrupted 3-0 silk  sutures in a Lembert type fashion.  A crotch stitch of 3-0 silk was  placed.  The anastomosis was patent, viable, under no tension, and  placed back into the abdominal cavity.   Gloves were changed once again.  I copiously irrigated out the abdominal  cavity and evacuated the fluid.  I noted no evidence of bleeding.  The  omentum was then placed back over the viscera.  I requested a needle,  sponge, and instrument count at this time and it was reported to be  correct.   The midline  fascia of the extraction site incision was then closed with  a running #1 PDS suture.  I then reinsufflated the abdomen and evacuated  some irrigation fluid in the right upper quadrant.  No active bleeding  was noted and the fascial closure was solid.  I removed the remaining  trocars and released the pneumoperitoneum.  The skin incisions were  irrigated and then closed with staples.  Sterile dressings were applied.  He tolerated the procedure well without any apparent complications and  was taken to the recovery room in satisfactory condition.      Adolph Pollack, M.D.  Electronically Signed     TJR/MEDQ  D:  03/31/2007  T:  03/31/2007  Job:  [id number]   cc:   Anselmo Rod, M.D.  Fax: [phone]   Evelena Peat, M.D.

## 2011-06-10 ENCOUNTER — Ambulatory Visit (INDEPENDENT_AMBULATORY_CARE_PROVIDER_SITE_OTHER): Payer: Medicare Other | Admitting: Family Medicine

## 2011-06-10 ENCOUNTER — Encounter: Payer: Self-pay | Admitting: Family Medicine

## 2011-06-10 VITALS — BP 150/90 | Temp 98.5°F | Ht 71.0 in | Wt 189.0 lb

## 2011-06-10 DIAGNOSIS — E785 Hyperlipidemia, unspecified: Secondary | ICD-10-CM

## 2011-06-10 DIAGNOSIS — H109 Unspecified conjunctivitis: Secondary | ICD-10-CM

## 2011-06-10 MED ORDER — TOBRAMYCIN 0.3 % OP SOLN: 1.0000 [drp] | OPHTHALMIC | Status: AC

## 2011-06-10 MED ORDER — ATORVASTATIN CALCIUM 20 MG PO TABS: ORAL_TABLET | ORAL | Status: DC

## 2011-06-10 NOTE — Progress Notes (Signed)
  Subjective:    Patient ID: Hector Lewis, male    DOB: 1941/01/03, 70 y.o.   MRN: [id number]  HPI Patient seen with right eye irritation. Recent travel. She has had some mostly clear tearing from the right eye for the past several days. Some sensitivity to light. No significant pain. No blurred vision. Was in [place] and started Polysporin which is over-the-counter without much improvement. No periocular rash.   Review of Systems  Constitutional: Negative for fever and chills.  HENT: Positive for ear pain. Negative for congestion.   Neurological: Negative for headaches.       Objective:   Physical Exam  Constitutional: He appears well-developed and well-nourished.  Eyes:       Left conjunctiva and cornea appear normal. Pupils are equal round reactive to light. Right conjunctiva mildly erythematous. No obvious corneal defects. Minimal mucoid discharge right eye. With fluorescein testing no evidence for abrasion or corneal ulceration or deformity.  Cardiovascular: Normal rate and regular rhythm.   Pulmonary/Chest: Effort normal and breath sounds normal. No respiratory distress. He has no wheezes. He has no rales.          Assessment & Plan:  Right conjunctivitis. Difficult to sort out possible partially treated bacterial versus viral. Stop Polysporin start tobramycin eyedrops every 4 hours and touch base one week if no better

## 2011-06-10 NOTE — Patient Instructions (Signed)
Be in touch in one week if eye symptoms no better.

## 2011-06-21 ENCOUNTER — Encounter: Payer: Self-pay | Admitting: Family Medicine

## 2011-06-21 ENCOUNTER — Ambulatory Visit (INDEPENDENT_AMBULATORY_CARE_PROVIDER_SITE_OTHER): Payer: Medicare Other | Admitting: Family Medicine

## 2011-06-21 VITALS — BP 150/80 | Temp 98.1°F | Wt 189.0 lb

## 2011-06-21 DIAGNOSIS — H579 Unspecified disorder of eye and adnexa: Secondary | ICD-10-CM

## 2011-06-21 DIAGNOSIS — H5789 Other specified disorders of eye and adnexa: Secondary | ICD-10-CM

## 2011-06-21 NOTE — Progress Notes (Signed)
  Subjective:    Patient ID: Hector Lewis, male    DOB: 25-Apr-1941, 70 y.o.   MRN: [id number]  HPI Persistent eye irritation right greater than left. Refer to prior note. No evidence for foreign body, corneal abrasion, or ulceration. Question of partially treated bacterial versus viral conjunctivitis. He's used tobramycin eye drops without improvement. No purulent secretions. No blurred vision. Does have some mild right eye pain. No surrounding rash. No contact lens use.   Review of Systems  Constitutional: Negative for fever and chills.  Eyes: Positive for photophobia, pain and discharge. Negative for itching and visual disturbance.  Neurological: Negative for dizziness and headaches.       Objective:   Physical Exam  Constitutional: He appears well-developed and well-nourished.  HENT:  Right Ear: External ear normal.  Left Ear: External ear normal.  Mouth/Throat: Oropharynx is clear and moist. No oropharyngeal exudate.  Eyes: Pupils are equal, round, and reactive to light.       Conjunctiva normal bilaterally. Pupils equal round reactive to light. Cornea appears normal bilaterally. Extraocular movements all intact. Funduscopic unremarkable          Assessment & Plan:  Persistent right greater than left eye irritation. Question iritis. Ophthalmology referral.

## 2011-09-21 LAB — CBC
HCT: 43.6
Hemoglobin: 15.5
MCHC: 35.5
MCV: 90
Platelets: 296
RBC: 4.85
RDW: 13.5
WBC: 9.5

## 2011-09-21 LAB — DIFFERENTIAL
Basophils Absolute: 0
Basophils Relative: 0
Eosinophils Absolute: 0.1 — ABNORMAL LOW
Eosinophils Relative: 1
Lymphocytes Relative: 10 — ABNORMAL LOW
Lymphs Abs: 0.9
Monocytes Absolute: 0.7
Monocytes Relative: 7
Neutro Abs: 7.8 — ABNORMAL HIGH
Neutrophils Relative %: 82 — ABNORMAL HIGH

## 2011-10-26 ENCOUNTER — Other Ambulatory Visit: Payer: Self-pay | Admitting: Dermatology

## 2011-12-31 ENCOUNTER — Encounter: Payer: Medicare Other | Admitting: Family Medicine

## 2012-01-06 ENCOUNTER — Encounter: Payer: Self-pay | Admitting: Family Medicine

## 2012-01-06 ENCOUNTER — Ambulatory Visit (INDEPENDENT_AMBULATORY_CARE_PROVIDER_SITE_OTHER): Payer: Medicare Other | Admitting: Family Medicine

## 2012-01-06 VITALS — BP 120/72 | HR 72 | Temp 97.5°F | Resp 12 | Ht 70.25 in | Wt 186.0 lb

## 2012-01-06 DIAGNOSIS — N139 Obstructive and reflux uropathy, unspecified: Secondary | ICD-10-CM

## 2012-01-06 DIAGNOSIS — K219 Gastro-esophageal reflux disease without esophagitis: Secondary | ICD-10-CM

## 2012-01-06 DIAGNOSIS — N401 Enlarged prostate with lower urinary tract symptoms: Secondary | ICD-10-CM

## 2012-01-06 DIAGNOSIS — E785 Hyperlipidemia, unspecified: Secondary | ICD-10-CM

## 2012-01-06 DIAGNOSIS — N138 Other obstructive and reflux uropathy: Secondary | ICD-10-CM

## 2012-01-06 DIAGNOSIS — Z8042 Family history of malignant neoplasm of prostate: Secondary | ICD-10-CM

## 2012-01-06 DIAGNOSIS — Z0000 Encounter for general adult medical examination without abnormal findings: Secondary | ICD-10-CM

## 2012-01-06 LAB — HEPATIC FUNCTION PANEL
ALT: 30 U/L (ref 0–53)
AST: 31 U/L (ref 0–37)
Albumin: 4.2 g/dL (ref 3.5–5.2)
Alkaline Phosphatase: 69 U/L (ref 39–117)
Bilirubin, Direct: 0.1 mg/dL (ref 0.0–0.3)
Total Bilirubin: 1.2 mg/dL (ref 0.3–1.2)
Total Protein: 6.9 g/dL (ref 6.0–8.3)

## 2012-01-06 LAB — CBC WITH DIFFERENTIAL/PLATELET
Basophils Absolute: 0 10*3/uL (ref 0.0–0.1)
Basophils Relative: 0.6 % (ref 0.0–3.0)
Eosinophils Absolute: 0.1 10*3/uL (ref 0.0–0.7)
Eosinophils Relative: 2.3 % (ref 0.0–5.0)
HCT: 46.2 % (ref 39.0–52.0)
Hemoglobin: 15.8 g/dL (ref 13.0–17.0)
Lymphocytes Relative: 29.6 % (ref 12.0–46.0)
Lymphs Abs: 1.7 10*3/uL (ref 0.7–4.0)
MCHC: 34.1 g/dL (ref 30.0–36.0)
MCV: 94.5 fl (ref 78.0–100.0)
Monocytes Absolute: 0.5 10*3/uL (ref 0.1–1.0)
Monocytes Relative: 8.7 % (ref 3.0–12.0)
Neutro Abs: 3.3 10*3/uL (ref 1.4–7.7)
Neutrophils Relative %: 58.8 % (ref 43.0–77.0)
Platelets: 214 10*3/uL (ref 150.0–400.0)
RBC: 4.89 Mil/uL (ref 4.22–5.81)
RDW: 13.1 % (ref 11.5–14.6)
WBC: 5.6 10*3/uL (ref 4.5–10.5)

## 2012-01-06 LAB — BASIC METABOLIC PANEL
BUN: 18 mg/dL (ref 6–23)
CO2: 27 mEq/L (ref 19–32)
Calcium: 9.2 mg/dL (ref 8.4–10.5)
Chloride: 106 mEq/L (ref 96–112)
Creatinine, Ser: 0.9 mg/dL (ref 0.4–1.5)
GFR: 85.43 mL/min (ref >60.00)
Glucose, Bld: 94 mg/dL (ref 70–99)
Potassium: 4.8 mEq/L (ref 3.5–5.1)
Sodium: 140 mEq/L (ref 135–145)

## 2012-01-06 LAB — LIPID PANEL
Cholesterol: 159 mg/dL (ref 0–200)
HDL: 43.3 mg/dL (ref >39.00)
LDL Cholesterol: 97 mg/dL (ref 0–99)
Total CHOL/HDL Ratio: 4
Triglycerides: 93 mg/dL (ref 0.0–149.0)
VLDL: 18.6 mg/dL (ref 0.0–40.0)

## 2012-01-06 LAB — PSA: PSA: 0.84 ng/mL (ref 0.10–4.00)

## 2012-01-06 MED ORDER — ATORVASTATIN CALCIUM 20 MG PO TABS: ORAL_TABLET | ORAL | Status: DC

## 2012-01-06 MED ORDER — PANTOPRAZOLE SODIUM 40 MG PO TBEC: 40.0000 mg | DELAYED_RELEASE_TABLET | Freq: Every day | ORAL | Status: DC

## 2012-01-06 NOTE — Progress Notes (Signed)
Subjective:    Patient ID: Hector Lewis, male    DOB: 1941-09-24, 71 y.o.   MRN: [id number]  HPI  Patient for Medicare wellness exam and medical followup. Medical problems include history of hyperlipidemia and GERD. Medications reviewed. Compliant with all. No side effects. He has no history of CAD. Exercises regularly.  No active GERD symptoms.  No dysphagia.  Immunizations reviewed. Pannus, influenza, pneumococcal, and shingles vaccine all up to date. Last colonoscopy 2009 with repeat recommended for next year.  1.  Risk factors based on Past Medical , Social, and Family history reviewed as below 2.  Limitations in physical activities exercises regularly. Low risk for fall 3.  Depression/mood no depression or anxiety issues 4.  Hearing no problems 5.  ADLs fully independent in all 6.  Cognitive function (orientation to time and place, language, writing, speech,memory) no issues with short or long-term memory deficits. Language and judgment intact 7.  Home Safety no issues 8.  Height, weight, and visual acuity. 9.  Counseling diet and exercise discussed 10. Recommendation of preventive services. Continue regular exercise. Repeat colonoscopy done next year 11. Labs based on risk factors lipid panel, hepatic panel, basic metabolic panel, CBC, and PSA 12. Care Plan as above  Past Medical History  Diagnosis Date  . HYPERLIPIDEMIA 05/30/2009  . 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  . METATARSALGIA 01/27/2010  . BUNION, LEFT FOOT 09/16/2008  . 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 MEDIAL MENISCUS TEAR, RIGHT 10/17/2008  . GERD 12/29/2010   Past Surgical History  Procedure Date  . Hammer toe surgery 2012  . Colonectomy 2008  . Colon surgery 2008    precancerous polyps    reports that he quit smoking about 43 years ago. His smoking use included Cigarettes. He has a 6 pack-year smoking history. He does not have any smokeless tobacco history on file. His alcohol and drug histories not on  file. family history includes Cancer in his father. Allergies  Allergen Reactions  . Codeine Sulfate         Review of Systems  Constitutional: Negative for fever, activity change, appetite change and fatigue.  HENT: Negative for ear pain, congestion and trouble swallowing.   Eyes: Negative for pain and visual disturbance.  Respiratory: Negative for cough, shortness of breath and wheezing.   Cardiovascular: Negative for chest pain and palpitations.  Gastrointestinal: Negative for nausea, vomiting, abdominal pain, diarrhea, constipation, blood in stool, abdominal distention and rectal pain.  Genitourinary: Negative for dysuria, hematuria and testicular pain.  Musculoskeletal: Negative for joint swelling and arthralgias.  Skin: Negative for rash.  Neurological: Negative for dizziness, syncope and headaches.  Hematological: Negative for adenopathy.  Psychiatric/Behavioral: Negative for confusion and dysphoric mood.       Objective:   Physical Exam  Constitutional: He is oriented to person, place, and time. He appears well-developed and well-nourished. No distress.  HENT:  Head: Normocephalic and atraumatic.  Right Ear: External ear normal.  Left Ear: External ear normal.  Mouth/Throat: Oropharynx is clear and moist.  Eyes: Conjunctivae and EOM are normal. Pupils are equal, round, and reactive to light.  Neck: Normal range of motion. Neck supple. No thyromegaly present.  Cardiovascular: Normal rate, regular rhythm and normal heart sounds.   No murmur heard. Pulmonary/Chest: No respiratory distress. He has no wheezes. He has no rales.  Abdominal: Soft. Bowel sounds are normal. He exhibits no distension and no mass. There is no tenderness. There is no rebound and no guarding.  Genitourinary: Rectum normal  and prostate normal.  Musculoskeletal: He exhibits no edema.  Lymphadenopathy:    He has no cervical adenopathy.  Neurological: He is alert and oriented to person, place, and  time. He displays normal reflexes. No cranial nerve deficit.  Skin: No rash noted.  Psychiatric: He has a normal mood and affect.          Assessment & Plan:  #1 health maintenance. Colonoscopy by next year. Screening labs as above. Immunizations up to date. Continue healthy life style habits  #2 hyperlipidemia. Recheck lipid and hepatic panel. Refill Lipitor for one year  #3 GERD stable with Protonix 40 mg daily. Refilled for one year

## 2012-01-06 NOTE — Progress Notes (Signed)
Quick Note:  Pt informed, he requested a copy, mailed ______

## 2013-01-09 ENCOUNTER — Encounter: Payer: Self-pay | Admitting: Family Medicine

## 2013-01-09 ENCOUNTER — Ambulatory Visit (INDEPENDENT_AMBULATORY_CARE_PROVIDER_SITE_OTHER): Payer: PRIVATE HEALTH INSURANCE | Admitting: Family Medicine

## 2013-01-09 VITALS — BP 142/70 | HR 72 | Temp 97.6°F | Resp 12 | Ht 70.25 in | Wt 189.0 lb

## 2013-01-09 DIAGNOSIS — E785 Hyperlipidemia, unspecified: Secondary | ICD-10-CM

## 2013-01-09 DIAGNOSIS — Z0000 Encounter for general adult medical examination without abnormal findings: Secondary | ICD-10-CM

## 2013-01-09 LAB — HEPATIC FUNCTION PANEL
ALT: 30 U/L (ref 0–53)
AST: 30 U/L (ref 0–37)
Albumin: 4.3 g/dL (ref 3.5–5.2)
Alkaline Phosphatase: 67 U/L (ref 39–117)
Bilirubin, Direct: 0.1 mg/dL (ref 0.0–0.3)
Total Bilirubin: 1.2 mg/dL (ref 0.3–1.2)
Total Protein: 7.1 g/dL (ref 6.0–8.3)

## 2013-01-09 LAB — CBC WITH DIFFERENTIAL/PLATELET
Basophils Absolute: 0 10*3/uL (ref 0.0–0.1)
Basophils Relative: 0.8 % (ref 0.0–3.0)
Eosinophils Absolute: 0.2 10*3/uL (ref 0.0–0.7)
Eosinophils Relative: 3.2 % (ref 0.0–5.0)
HCT: 46.3 % (ref 39.0–52.0)
Hemoglobin: 15.8 g/dL (ref 13.0–17.0)
Lymphocytes Relative: 29.6 % (ref 12.0–46.0)
Lymphs Abs: 1.7 10*3/uL (ref 0.7–4.0)
MCHC: 34.2 g/dL (ref 30.0–36.0)
MCV: 93.2 fl (ref 78.0–100.0)
Monocytes Absolute: 0.5 10*3/uL (ref 0.1–1.0)
Monocytes Relative: 8 % (ref 3.0–12.0)
Neutro Abs: 3.3 10*3/uL (ref 1.4–7.7)
Neutrophils Relative %: 58.4 % (ref 43.0–77.0)
Platelets: 184 10*3/uL (ref 150.0–400.0)
RBC: 4.97 Mil/uL (ref 4.22–5.81)
RDW: 13 % (ref 11.5–14.6)
WBC: 5.7 10*3/uL (ref 4.5–10.5)

## 2013-01-09 LAB — LIPID PANEL
Cholesterol: 168 mg/dL (ref 0–200)
HDL: 47.4 mg/dL (ref >39.00)
LDL Cholesterol: 92 mg/dL (ref 0–99)
Total CHOL/HDL Ratio: 4
Triglycerides: 143 mg/dL (ref 0.0–149.0)
VLDL: 28.6 mg/dL (ref 0.0–40.0)

## 2013-01-09 LAB — BASIC METABOLIC PANEL
BUN: 15 mg/dL (ref 6–23)
CO2: 28 mEq/L (ref 19–32)
Calcium: 9.4 mg/dL (ref 8.4–10.5)
Chloride: 105 mEq/L (ref 96–112)
Creatinine, Ser: 0.9 mg/dL (ref 0.4–1.5)
GFR: 86.25 mL/min (ref >60.00)
Glucose, Bld: 98 mg/dL (ref 70–99)
Potassium: 4.5 mEq/L (ref 3.5–5.1)
Sodium: 140 mEq/L (ref 135–145)

## 2013-01-09 LAB — PSA: PSA: 0.86 ng/mL (ref 0.10–4.00)

## 2013-01-09 LAB — TSH: TSH: 2.57 u[IU]/mL (ref 0.35–5.50)

## 2013-01-09 MED ORDER — ATORVASTATIN CALCIUM 20 MG PO TABS: ORAL_TABLET | ORAL | Status: DC

## 2013-01-09 MED ORDER — PANTOPRAZOLE SODIUM 40 MG PO TBEC: 40.0000 mg | DELAYED_RELEASE_TABLET | Freq: Every day | ORAL | Status: DC

## 2013-01-09 NOTE — Progress Notes (Signed)
  Subjective:    Patient ID: Hector Lewis, male    DOB: July 12, 1941, 72 y.o.   MRN: [id number]  HPI Patient here for wellness exam and medical followup. He has history of precancerous polyp and had partial colectomy back in 2008. He is due for repeat colonoscopy this year. He has hyperlipidemia treated with Lipitor. GERD treated with pantoprazole. He had recurrent symptoms when he stopped this medication. Immunizations are up-to-date. Exercises regularly about 4 days to 5 days per week  Past Medical History  Diagnosis Date  . HYPERLIPIDEMIA 05/30/2009  . 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  . METATARSALGIA 01/27/2010  . BUNION, LEFT FOOT 09/16/2008  . 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 MEDIAL MENISCUS TEAR, RIGHT 10/17/2008  . GERD 12/29/2010   Past Surgical History  Procedure Date  . Hammer toe surgery 2012  . Colonectomy 2008  . Colon surgery 2008    precancerous polyps    reports that he quit smoking about 44 years ago. His smoking use included Cigarettes. He has a 6 pack-year smoking history. He does not have any smokeless tobacco history on file. His alcohol and drug histories not on file. family history includes Cancer in his father. Allergies  Allergen Reactions  . Codeine Sulfate       Review of Systems  Constitutional: Negative for fever, activity change, appetite change, fatigue and unexpected weight change.  HENT: Negative for ear pain, congestion and trouble swallowing.   Eyes: Negative for pain and visual disturbance.  Respiratory: Negative for cough, shortness of breath and wheezing.   Cardiovascular: Negative for chest pain and palpitations.  Gastrointestinal: Negative for nausea, vomiting, abdominal pain, diarrhea, constipation, blood in stool, abdominal distention and rectal pain.  Genitourinary: Negative for dysuria, hematuria and testicular pain.  Musculoskeletal: Negative for joint swelling and arthralgias.  Skin: Negative for rash.  Neurological: Negative for  dizziness, syncope and headaches.  Hematological: Negative for adenopathy.  Psychiatric/Behavioral: Negative for confusion and dysphoric mood.       Objective:   Physical Exam  Constitutional: He is oriented to person, place, and time. He appears well-developed and well-nourished. No distress.  HENT:  Head: Normocephalic and atraumatic.  Right Ear: External ear normal.  Left Ear: External ear normal.  Mouth/Throat: Oropharynx is clear and moist.  Eyes: Conjunctivae normal and EOM are normal. Pupils are equal, round, and reactive to light.  Neck: Normal range of motion. Neck supple. No thyromegaly present.  Cardiovascular: Normal rate, regular rhythm and normal heart sounds.   No murmur heard. Pulmonary/Chest: No respiratory distress. He has no wheezes. He has no rales.  Abdominal: Soft. Bowel sounds are normal. He exhibits no distension and no mass. There is no tenderness. There is no rebound and no guarding.  Genitourinary: Rectum normal and prostate normal.  Musculoskeletal: He exhibits no edema.  Lymphadenopathy:    He has no cervical adenopathy.  Neurological: He is alert and oriented to person, place, and time. He displays normal reflexes. No cranial nerve deficit.  Skin: No rash noted.  Psychiatric: He has a normal mood and affect.          Assessment & Plan:  #1 complete physical. Healthy 72 year old male. Immunizations up-to-date. Patient will schedule repeat colonoscopy this year. Immunizations up-to-date #2 hyperlipidemia. Check lipid and hepatic panel. Refill Lipitor for one year #3 GERD. Stable. Refilled pantoprazole for one year

## 2013-01-10 NOTE — Progress Notes (Signed)
Quick Note:  Pt informed, he also saw labs on My chart ______

## 2013-01-27 ENCOUNTER — Other Ambulatory Visit: Payer: Self-pay

## 2013-06-18 ENCOUNTER — Encounter: Payer: Self-pay | Admitting: Family Medicine

## 2013-06-18 ENCOUNTER — Ambulatory Visit (INDEPENDENT_AMBULATORY_CARE_PROVIDER_SITE_OTHER): Payer: PRIVATE HEALTH INSURANCE | Admitting: Family Medicine

## 2013-06-18 VITALS — BP 156/88 | HR 67 | Ht 71.0 in | Wt 181.0 lb

## 2013-06-18 DIAGNOSIS — M25561 Pain in right knee: Secondary | ICD-10-CM

## 2013-06-18 DIAGNOSIS — M25569 Pain in unspecified knee: Secondary | ICD-10-CM

## 2013-06-18 NOTE — Patient Instructions (Addendum)
Your history and exam are consistent with a small meniscal tear. These are treated conservatively. Start with home exercises - 3 sets of 10 of all 4 exercises shown, once a day.  Add an ankle weight if these become too easy. Ice 15 minutes at a time 3-4 times a day. Be careful on uneven ground though you'll have no restrictions. Deep squats and lunges usually aggravate this as well. Knee brace when it's really bothering you or you're going to be physically active. Aleve 1-2 tabs twice a day with food for pain and inflammation x 7 days then as needed. Can consider a cortisone shot if pain is severe or you're not improving. Follow up with me or Dr. Darrick Penna in about 1 month to 6 weeks.

## 2013-06-19 ENCOUNTER — Encounter: Payer: Self-pay | Admitting: Family Medicine

## 2013-06-19 NOTE — Assessment & Plan Note (Signed)
History, exam consistent with a meniscal tear.  Start with conservative care - home exercise program reviewed.  Icing, knee brace, aleve.  Avoid uneven ground, squats, lunges.  Consider injection if not improving.  F/u in 1 month to 6 weeks for reevaluation.

## 2013-06-19 NOTE — Progress Notes (Signed)
Patient ID: Hector Lewis, male   DOB: May 10, 1941, 72 y.o.   MRN: [id number]  PCP: Kristian Covey, MD  Subjective:   HPI: Patient is a 72 y.o. male here for right knee pain.  Patient reports about 1 week ago he recalls he had been pulling granddaughter a lot on the slip n slide No acute injury but did have a sharp pain later when he stood up - felt in lateral aspect of right knee. Told in past he has thinning meniscus here. Has been bracing, did HEP previously. Icing, aleve as needed. No catching, locking, giving out. Some slight swelling. Works out at [employer] regularly.  Past Medical History  Diagnosis Date  . HYPERLIPIDEMIA 05/30/2009  . 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  . METATARSALGIA 01/27/2010  . BUNION, LEFT FOOT 09/16/2008  . 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 MEDIAL MENISCUS TEAR, RIGHT 10/17/2008  . GERD 12/29/2010  . Hypertension     Current Outpatient Prescriptions on File Prior to Visit  Medication Sig Dispense Refill  . aspirin 81 MG tablet Take 81 mg by mouth daily.        Marland Kitchen atorvastatin (LIPITOR) 20 MG tablet One tab by mouth every other day  90 tablet  3  . Multiple Vitamins-Minerals (OCUVITE ADULT 50+ PO) Take by mouth daily.        . pantoprazole (PROTONIX) 40 MG tablet Take 1 tablet (40 mg total) by mouth daily.  90 tablet  3   No current facility-administered medications on file prior to visit.    Past Surgical History  Procedure Laterality Date  . Hammer toe surgery  2012  . Colonectomy  2008  . Colon surgery  2008    precancerous polyps    Allergies  Allergen Reactions  . Codeine Sulfate     History   Social History  . Marital Status: Married    Spouse Name: N/A    Number of Children: N/A  . Years of Education: N/A   Occupational History  . Not on file.   Social History Main Topics  . Smoking status: Former Smoker -- 1.00 packs/day for 6 years    Types: Cigarettes    Quit date: 06/09/1968  . Smokeless tobacco: Not on file  . Alcohol  Use: Not on file  . Drug Use: Not on file  . Sexually Active: Not on file   Other Topics Concern  . Not on file   Social History Narrative  . No narrative on file    Family History  Problem Relation Age of Onset  . Cancer Father     colon, prostate  . Hyperlipidemia Mother     BP 156/88  Pulse 67  Ht 5\' 11"  (1.803 m)  Wt 181 lb (82.101 kg)  BMI 25.26 kg/m2  Review of Systems: See HPI above.    Objective:  Physical Exam:  Gen: NAD  Right knee: No gross deformity, ecchymoses, swelling. Mild lateral joint line TTP.  No medial, post patellar facet, other TTP. FROM. Negative ant/post drawers. Negative valgus/varus testing. Negative lachmanns. Mild pain laterally with mcmurrays, apleys, thessalys.  Negative patellar apprehension. NV intact distally.    Assessment & Plan:  1. Right knee pain - History, exam consistent with a meniscal tear.  Start with conservative care - home exercise program reviewed.  Icing, knee brace, aleve.  Avoid uneven ground, squats, lunges.  Consider injection if not improving.  F/u in 1 month to 6 weeks for reevaluation.

## 2013-07-18 ENCOUNTER — Other Ambulatory Visit: Payer: Self-pay

## 2013-08-02 ENCOUNTER — Encounter: Payer: Self-pay | Admitting: Family Medicine

## 2013-08-02 ENCOUNTER — Ambulatory Visit: Payer: PRIVATE HEALTH INSURANCE | Admitting: Family Medicine

## 2013-08-02 ENCOUNTER — Ambulatory Visit (INDEPENDENT_AMBULATORY_CARE_PROVIDER_SITE_OTHER): Payer: PRIVATE HEALTH INSURANCE | Admitting: Family Medicine

## 2013-08-02 VITALS — BP 146/78 | HR 69 | Ht 71.0 in | Wt 185.0 lb

## 2013-08-02 DIAGNOSIS — M25569 Pain in unspecified knee: Secondary | ICD-10-CM

## 2013-08-02 DIAGNOSIS — M25561 Pain in right knee: Secondary | ICD-10-CM

## 2013-08-03 ENCOUNTER — Encounter: Payer: Self-pay | Admitting: Family Medicine

## 2013-08-03 DIAGNOSIS — M25561 Pain in right knee: Secondary | ICD-10-CM | POA: Insufficient documentation

## 2013-08-03 NOTE — Assessment & Plan Note (Signed)
History, exam consistent with a meniscal tear.  Improved compared to last visit.  Advised he continue with home exercise program, nsaids for now.  If pain continues to bother him and he plateaus in recovery would consider formal PT or injection.  F/u in 6 weeks or as needed.

## 2013-08-03 NOTE — Progress Notes (Signed)
Patient ID: Hector Lewis, male   DOB: 06/23/1941, 72 y.o.   MRN: [id number]  PCP: Kristian Covey, MD  Subjective:   HPI: Patient is a 72 y.o. male here for f/u right knee pain.  7/7: Patient reports about 1 week ago he recalls he had been pulling granddaughter a lot on the slip n slide No acute injury but did have a sharp pain later when he stood up - felt in lateral aspect of right knee. Told in past he has thinning meniscus here. Has been bracing, did HEP previously. Icing, aleve as needed. No catching, locking, giving out. Some slight swelling. Works out at [employer] regularly.  8/21: Patient reports he has improved since last visit. Taking aleve in morning and evening but can't take 2 at a time because they upset his stomach. No swelling. Gets to be up to a 3/10 especially when getting in or out of a car. Uses brace with physical activity. Doing home exercises. No catching, locking.  Past Medical History  Diagnosis Date  . HYPERLIPIDEMIA 05/30/2009  . 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  . METATARSALGIA 01/27/2010  . BUNION, LEFT FOOT 09/16/2008  . 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 MEDIAL MENISCUS TEAR, RIGHT 10/17/2008  . GERD 12/29/2010  . Hypertension     Current Outpatient Prescriptions on File Prior to Visit  Medication Sig Dispense Refill  . aspirin 81 MG tablet Take 81 mg by mouth daily.        Marland Kitchen atorvastatin (LIPITOR) 20 MG tablet One tab by mouth every other day  90 tablet  3  . fish oil-omega-3 fatty acids 1000 MG capsule Take 2 g by mouth daily.      . Multiple Vitamins-Minerals (OCUVITE ADULT 50+ PO) Take by mouth daily.        . pantoprazole (PROTONIX) 40 MG tablet Take 1 tablet (40 mg total) by mouth daily.  90 tablet  3   No current facility-administered medications on file prior to visit.    Past Surgical History  Procedure Laterality Date  . Hammer toe surgery  2012  . Colonectomy  2008  . Colon surgery  2008    precancerous polyps    Allergies   Allergen Reactions  . Codeine Sulfate     History   Social History  . Marital Status: Married    Spouse Name: N/A    Number of Children: N/A  . Years of Education: N/A   Occupational History  . Not on file.   Social History Main Topics  . Smoking status: Former Smoker -- 1.00 packs/day for 6 years    Types: Cigarettes    Quit date: 06/09/1968  . Smokeless tobacco: Not on file  . Alcohol Use: Not on file  . Drug Use: Not on file  . Sexual Activity: Not on file   Other Topics Concern  . Not on file   Social History Narrative  . No narrative on file    Family History  Problem Relation Age of Onset  . Cancer Father     colon, prostate  . Hyperlipidemia Mother     BP 146/78  Pulse 69  Ht 5\' 11"  (1.803 m)  Wt 185 lb (83.915 kg)  BMI 25.81 kg/m2  Review of Systems: See HPI above.    Objective:  Physical Exam:  Gen: NAD  Right knee: No gross deformity, ecchymoses, swelling. No tenderness currently lateral joint line.  No medial, post patellar facet, other TTP. FROM. Negative ant/post drawers. Negative valgus/varus  testing. Negative lachmanns. No pain laterally with mcmurrays, apleys.  Negative patellar apprehension. NV intact distally.    Assessment & Plan:  1. Right knee pain - History, exam consistent with a meniscal tear.  Improved compared to last visit.  Advised he continue with home exercise program, nsaids for now.  If pain continues to bother him and he plateaus in recovery would consider formal PT or injection.  F/u in 6 weeks or as needed.

## 2013-08-10 ENCOUNTER — Encounter: Payer: Self-pay | Admitting: Family Medicine

## 2013-08-10 ENCOUNTER — Ambulatory Visit (INDEPENDENT_AMBULATORY_CARE_PROVIDER_SITE_OTHER): Payer: PRIVATE HEALTH INSURANCE | Admitting: Family Medicine

## 2013-08-10 VITALS — BP 134/78 | HR 73 | Temp 97.7°F | Wt 183.0 lb

## 2013-08-10 DIAGNOSIS — R21 Rash and other nonspecific skin eruption: Secondary | ICD-10-CM

## 2013-08-10 MED ORDER — METHYLPREDNISOLONE ACETATE 80 MG/ML IJ SUSP
80.0000 mg | Freq: Once | INTRAMUSCULAR | Status: AC
  Administered 2013-08-10: 80 mg via INTRAMUSCULAR

## 2013-08-10 NOTE — Patient Instructions (Addendum)
Continue with antihistamine and try to keep cool. Touch base by next week if this is not resolving

## 2013-08-10 NOTE — Addendum Note (Signed)
Addended by: Thomasena Edis on: 08/10/2013 10:28 AM   Modules accepted: Orders

## 2013-08-10 NOTE — Progress Notes (Signed)
  Subjective:    Patient ID: Hector Lewis, male    DOB: [birth year], 72 y.o.   MRN: [id number]  HPI  Acute visit for pruritic rash involving lower extremities Onset about one week ago. Noted after doing some yard work. He has sparing of the legs. He's noted some reddish colored bumps but no pustules. These are nontender. No fevers or chills. He initially wondered if this may been scabies but he has not had any suspicious exposures. Tried over-the-counter topical steroid cream without much improvement. Rash is exacerbated by heat.  Past Medical History  Diagnosis Date  . HYPERLIPIDEMIA 05/30/2009  . 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  . METATARSALGIA 01/27/2010  . BUNION, LEFT FOOT 09/16/2008  . 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 MEDIAL MENISCUS TEAR, RIGHT 10/17/2008  . GERD 12/29/2010  . Hypertension    Past Surgical History  Procedure Laterality Date  . Hammer toe surgery  2012  . Colonectomy  2008  . Colon surgery  2008    precancerous polyps    reports that he quit smoking about 45 years ago. His smoking use included Cigarettes. He has a 6 pack-year smoking history. He does not have any smokeless tobacco history on file. His alcohol and drug histories are not on file. family history includes Cancer in his father; Hyperlipidemia in his mother. Allergies  Allergen Reactions  . Codeine Sulfate      Review of Systems  Constitutional: Negative for fever and chills.  Skin: Positive for rash.       Objective:   Physical Exam  Constitutional: He appears well-developed and well-nourished.  Cardiovascular: Normal rate and regular rhythm.   Pulmonary/Chest: Effort normal and breath sounds normal. No respiratory distress. He has no wheezes. He has no rales.  Skin: Rash noted.  Patient has nonspecific follicular rash with discrete erythematous papules scattered mostly around the waist and upper thigh bilaterally. No pustules. No clear vesicles. Nontender          Assessment &  Plan:  Nonspecific follicular rash right and left thighs. Question allergic. No response to antihistamines. We'll try Depo-Medrol 80 mg IM. Avoid excessive heat

## 2013-10-18 ENCOUNTER — Other Ambulatory Visit: Payer: Self-pay

## 2013-10-24 ENCOUNTER — Other Ambulatory Visit: Payer: Self-pay | Admitting: Dermatology

## 2014-01-04 ENCOUNTER — Other Ambulatory Visit (INDEPENDENT_AMBULATORY_CARE_PROVIDER_SITE_OTHER): Payer: Medicare Other

## 2014-01-04 DIAGNOSIS — E785 Hyperlipidemia, unspecified: Secondary | ICD-10-CM

## 2014-01-04 DIAGNOSIS — Z0000 Encounter for general adult medical examination without abnormal findings: Secondary | ICD-10-CM

## 2014-01-04 DIAGNOSIS — Z125 Encounter for screening for malignant neoplasm of prostate: Secondary | ICD-10-CM

## 2014-01-04 LAB — PSA: PSA: 0.85 ng/mL (ref 0.10–4.00)

## 2014-01-04 LAB — CBC WITH DIFFERENTIAL/PLATELET
Basophils Absolute: 0 10*3/uL (ref 0.0–0.1)
Basophils Relative: 0 % (ref 0.0–3.0)
Eosinophils Absolute: 0 10*3/uL (ref 0.0–0.7)
Eosinophils Relative: 0 % (ref 0.0–5.0)
HCT: 48.6 % (ref 39.0–52.0)
Hemoglobin: 16.5 g/dL (ref 13.0–17.0)
Lymphocytes Relative: 7.2 % — ABNORMAL LOW (ref 12.0–46.0)
Lymphs Abs: 1 10*3/uL (ref 0.7–4.0)
MCHC: 33.9 g/dL (ref 30.0–36.0)
MCV: 93.3 fl (ref 78.0–100.0)
Monocytes Absolute: 0.3 10*3/uL (ref 0.1–1.0)
Monocytes Relative: 2.3 % — ABNORMAL LOW (ref 3.0–12.0)
Neutro Abs: 12.1 10*3/uL — ABNORMAL HIGH (ref 1.4–7.7)
Neutrophils Relative %: 90.5 % — ABNORMAL HIGH (ref 43.0–77.0)
Platelets: 208 10*3/uL (ref 150.0–400.0)
RBC: 5.2 Mil/uL (ref 4.22–5.81)
RDW: 13.6 % (ref 11.5–14.6)
WBC: 13.3 10*3/uL — ABNORMAL HIGH (ref 4.5–10.5)

## 2014-01-04 LAB — HEPATIC FUNCTION PANEL
ALT: 28 U/L (ref 0–53)
AST: 25 U/L (ref 0–37)
Albumin: 4.2 g/dL (ref 3.5–5.2)
Alkaline Phosphatase: 66 U/L (ref 39–117)
Bilirubin, Direct: 0.1 mg/dL (ref 0.0–0.3)
Total Bilirubin: 0.8 mg/dL (ref 0.3–1.2)
Total Protein: 7.2 g/dL (ref 6.0–8.3)

## 2014-01-04 LAB — LIPID PANEL
Cholesterol: 208 mg/dL — ABNORMAL HIGH (ref 0–200)
HDL: 66.4 mg/dL (ref >39.00)
Total CHOL/HDL Ratio: 3
Triglycerides: 39 mg/dL (ref 0.0–149.0)
VLDL: 7.8 mg/dL (ref 0.0–40.0)

## 2014-01-04 LAB — POCT URINALYSIS DIPSTICK
Bilirubin, UA: NEGATIVE
Blood, UA: NEGATIVE
Glucose, UA: NEGATIVE
Leukocytes, UA: NEGATIVE
Nitrite, UA: NEGATIVE
Spec Grav, UA: 1.025
Urobilinogen, UA: 0.2
pH, UA: 6

## 2014-01-04 LAB — BASIC METABOLIC PANEL
BUN: 21 mg/dL (ref 6–23)
CO2: 24 mEq/L (ref 19–32)
Calcium: 9.7 mg/dL (ref 8.4–10.5)
Chloride: 106 mEq/L (ref 96–112)
Creatinine, Ser: 0.9 mg/dL (ref 0.4–1.5)
GFR: 86.01 mL/min (ref >60.00)
Glucose, Bld: 108 mg/dL — ABNORMAL HIGH (ref 70–99)
Potassium: 4.8 mEq/L (ref 3.5–5.1)
Sodium: 139 mEq/L (ref 135–145)

## 2014-01-04 LAB — LDL CHOLESTEROL, DIRECT: Direct LDL: 131.2 mg/dL

## 2014-01-04 LAB — TSH: TSH: 1.41 u[IU]/mL (ref 0.35–5.50)

## 2014-01-11 ENCOUNTER — Ambulatory Visit (INDEPENDENT_AMBULATORY_CARE_PROVIDER_SITE_OTHER): Payer: Medicare Other | Admitting: Family Medicine

## 2014-01-11 ENCOUNTER — Encounter: Payer: Self-pay | Admitting: Family Medicine

## 2014-01-11 VITALS — BP 126/68 | HR 84 | Temp 97.5°F | Ht 71.0 in | Wt 181.0 lb

## 2014-01-11 DIAGNOSIS — Z23 Encounter for immunization: Secondary | ICD-10-CM

## 2014-01-11 DIAGNOSIS — E785 Hyperlipidemia, unspecified: Secondary | ICD-10-CM

## 2014-01-11 DIAGNOSIS — Z0000 Encounter for general adult medical examination without abnormal findings: Secondary | ICD-10-CM

## 2014-01-11 MED ORDER — PANTOPRAZOLE SODIUM 40 MG PO TBEC: 40.0000 mg | DELAYED_RELEASE_TABLET | Freq: Every day | ORAL | Status: DC

## 2014-01-11 MED ORDER — ATORVASTATIN CALCIUM 20 MG PO TABS: ORAL_TABLET | ORAL | Status: DC

## 2014-01-11 NOTE — Patient Instructions (Signed)

## 2014-01-11 NOTE — Progress Notes (Signed)
Pre visit review using our clinic review tool, if applicable. No additional management support is needed unless otherwise documented below in the visit note. 

## 2014-01-11 NOTE — Progress Notes (Signed)
Subjective:    Patient ID: Hector Lewis, male    DOB: [birth year], 73 y.o.   MRN: [id number]  HPI Patient seen for Medicare wellness exam and medical followup He has hyperlipidemia treated with Lipitor. He has GERD which is controlled with pantoprazole. He continues to exercise regularly most days of week. No chest pains. No dizziness. His vaccines are up to date with exception of no history of Prevnar 13. He had 23 Valent pneumococcal vaccine several years ago. He gets yearly flu vaccines.  He had repeat colonoscopy last year. Recommended five-year followup Patient relates steroid injection into his right knee just a couple days before labs were drawn and this likely accounts for his elevated white blood cell count   Past Surgical History  Procedure Laterality Date  . Hammer toe surgery  2012  . Colonectomy  2008  . Colon surgery  2008    precancerous polyps    reports that he quit smoking about 45 years ago. His smoking use included Cigarettes. He has a 6 pack-year smoking history. He does not have any smokeless tobacco history on file. His alcohol and drug histories are not on file. family history includes Cancer in his father; Hyperlipidemia in his mother. Allergies  Allergen Reactions  . Codeine Sulfate    1.  Risk factors based on Past Medical , Social, and Family history reviewed and as above 2.  Limitations in physical activities low risk for fall 3.  Depression/mood no depression or anxiety issues 4.  Hearing no deficits 5.  ADLs independent in all 6.  Cognitive function (orientation to time and place, language, writing, speech,memory) memory intact. Language and judgment intact 7.  Home Safety no issues 8.  Height, weight, and visual acuity. All stable 9.  Counseling recommend continued regular physical activity. 10. Recommendation of preventive services. Prevnar 13 given 11. Labs based on risk factors labs were obtained as above and reviewed. He has elevated  white count and received steroid injections one week prior which very likely accounts for this 12. Care Plan as above    Review of Systems  Constitutional: Negative for fever, activity change, appetite change and fatigue.  HENT: Negative for congestion, ear pain and trouble swallowing.   Eyes: Negative for pain and visual disturbance.  Respiratory: Negative for cough, shortness of breath and wheezing.   Cardiovascular: Negative for chest pain and palpitations.  Gastrointestinal: Negative for nausea, vomiting, abdominal pain, diarrhea, constipation, blood in stool, abdominal distention and rectal pain.  Genitourinary: Negative for dysuria, hematuria and testicular pain.  Musculoskeletal: Negative for arthralgias and joint swelling.  Skin: Negative for rash.  Neurological: Negative for dizziness, syncope and headaches.  Hematological: Negative for adenopathy.  Psychiatric/Behavioral: Negative for confusion and dysphoric mood.       Objective:   Physical Exam  Constitutional: He is oriented to person, place, and time. He appears well-developed and well-nourished. No distress.  HENT:  Head: Normocephalic and atraumatic.  Right Ear: External ear normal.  Left Ear: External ear normal.  Mouth/Throat: Oropharynx is clear and moist.  Eyes: Conjunctivae and EOM are normal. Pupils are equal, round, and reactive to light.  Neck: Normal range of motion. Neck supple. No thyromegaly present.  Cardiovascular: Normal rate, regular rhythm and normal heart sounds.   No murmur heard. Pulmonary/Chest: No respiratory distress. He has no wheezes. He has no rales.  Abdominal: Soft. Bowel sounds are normal. He exhibits no distension and no mass. There is no tenderness. There is no rebound  and no guarding.  Musculoskeletal: He exhibits no edema.  Lymphadenopathy:    He has no cervical adenopathy.  Neurological: He is alert and oriented to person, place, and time. He displays normal reflexes. No cranial  nerve deficit.  Skin: No rash noted.  Patient has significant skin sloughing on his face from recent topical cream per dermatologist to treat actinic keratoses.  No cellulitis changes  Psychiatric: He has a normal mood and affect.          Assessment & Plan:  #1 Complete physical. Prevnar 13 given. Other immunizations up to date. Continue healthy lifestyle habits. Colonoscopy up to date. #2 hyperlipidemia. Stable. Refill Lipitor for one year #3 GERD controlled with Protonix

## 2015-01-06 ENCOUNTER — Other Ambulatory Visit (INDEPENDENT_AMBULATORY_CARE_PROVIDER_SITE_OTHER): Payer: Medicare Other

## 2015-01-06 DIAGNOSIS — Z0000 Encounter for general adult medical examination without abnormal findings: Secondary | ICD-10-CM

## 2015-01-06 DIAGNOSIS — I10 Essential (primary) hypertension: Secondary | ICD-10-CM

## 2015-01-06 DIAGNOSIS — E785 Hyperlipidemia, unspecified: Secondary | ICD-10-CM

## 2015-01-06 DIAGNOSIS — Z125 Encounter for screening for malignant neoplasm of prostate: Secondary | ICD-10-CM

## 2015-01-06 LAB — CBC WITH DIFFERENTIAL/PLATELET
Basophils Absolute: 0 10*3/uL (ref 0.0–0.1)
Basophils Relative: 0.5 % (ref 0.0–3.0)
Eosinophils Absolute: 0.3 10*3/uL (ref 0.0–0.7)
Eosinophils Relative: 5.3 % — ABNORMAL HIGH (ref 0.0–5.0)
HCT: 46 % (ref 39.0–52.0)
Hemoglobin: 16.1 g/dL (ref 13.0–17.0)
Lymphocytes Relative: 27.3 % (ref 12.0–46.0)
Lymphs Abs: 1.7 10*3/uL (ref 0.7–4.0)
MCHC: 34.9 g/dL (ref 30.0–36.0)
MCV: 91.6 fl (ref 78.0–100.0)
Monocytes Absolute: 0.6 10*3/uL (ref 0.1–1.0)
Monocytes Relative: 9.3 % (ref 3.0–12.0)
Neutro Abs: 3.7 10*3/uL (ref 1.4–7.7)
Neutrophils Relative %: 57.6 % (ref 43.0–77.0)
Platelets: 186 10*3/uL (ref 150.0–400.0)
RBC: 5.02 Mil/uL (ref 4.22–5.81)
RDW: 13.3 % (ref 11.5–15.5)
WBC: 6.4 10*3/uL (ref 4.0–10.5)

## 2015-01-06 LAB — COMPREHENSIVE METABOLIC PANEL
ALT: 23 U/L (ref 0–53)
AST: 23 U/L (ref 0–37)
Albumin: 4.4 g/dL (ref 3.5–5.2)
Alkaline Phosphatase: 79 U/L (ref 39–117)
BUN: 15 mg/dL (ref 6–23)
CO2: 26 mEq/L (ref 19–32)
Calcium: 9.4 mg/dL (ref 8.4–10.5)
Chloride: 105 mEq/L (ref 96–112)
Creatinine, Ser: 0.9 mg/dL (ref 0.40–1.50)
GFR: 87.97 mL/min (ref >60.00)
Glucose, Bld: 100 mg/dL — ABNORMAL HIGH (ref 70–99)
Potassium: 4 mEq/L (ref 3.5–5.1)
Sodium: 140 mEq/L (ref 135–145)
Total Bilirubin: 0.7 mg/dL (ref 0.2–1.2)
Total Protein: 6.9 g/dL (ref 6.0–8.3)

## 2015-01-06 LAB — LIPID PANEL
Cholesterol: 187 mg/dL (ref 0–200)
HDL: 51.9 mg/dL (ref >39.00)
LDL Cholesterol: 108 mg/dL — ABNORMAL HIGH (ref 0–99)
NonHDL: 135.1
Total CHOL/HDL Ratio: 4
Triglycerides: 134 mg/dL (ref 0.0–149.0)
VLDL: 26.8 mg/dL (ref 0.0–40.0)

## 2015-01-06 LAB — POCT URINALYSIS DIPSTICK
Bilirubin, UA: NEGATIVE
Glucose, UA: NEGATIVE
Ketones, UA: NEGATIVE
Leukocytes, UA: NEGATIVE
Nitrite, UA: NEGATIVE
Protein, UA: NEGATIVE
Spec Grav, UA: 1.02
Urobilinogen, UA: 0.2
pH, UA: 5.5

## 2015-01-06 LAB — TSH: TSH: 3.55 u[IU]/mL (ref 0.35–4.50)

## 2015-01-06 LAB — PSA: PSA: 1.19 ng/mL (ref 0.10–4.00)

## 2015-01-13 ENCOUNTER — Encounter: Payer: Medicare Other | Admitting: Family Medicine

## 2015-01-15 ENCOUNTER — Ambulatory Visit (INDEPENDENT_AMBULATORY_CARE_PROVIDER_SITE_OTHER): Payer: Medicare Other | Admitting: Family Medicine

## 2015-01-15 ENCOUNTER — Encounter: Payer: Self-pay | Admitting: Family Medicine

## 2015-01-15 VITALS — BP 140/80 | HR 75 | Temp 97.8°F | Ht 70.0 in | Wt 186.0 lb

## 2015-01-15 DIAGNOSIS — E785 Hyperlipidemia, unspecified: Secondary | ICD-10-CM

## 2015-01-15 DIAGNOSIS — R319 Hematuria, unspecified: Secondary | ICD-10-CM

## 2015-01-15 DIAGNOSIS — Z0000 Encounter for general adult medical examination without abnormal findings: Secondary | ICD-10-CM

## 2015-01-15 LAB — POCT URINALYSIS DIPSTICK
Bilirubin, UA: NEGATIVE
Blood, UA: NEGATIVE
Glucose, UA: NEGATIVE
Ketones, UA: NEGATIVE
Nitrite, UA: NEGATIVE
Protein, UA: NEGATIVE
Spec Grav, UA: 1.015
Urobilinogen, UA: 0.2
pH, UA: 5

## 2015-01-15 MED ORDER — ATORVASTATIN CALCIUM 20 MG PO TABS: ORAL_TABLET | ORAL | Status: DC

## 2015-01-15 MED ORDER — PANTOPRAZOLE SODIUM 40 MG PO TBEC: 40.0000 mg | DELAYED_RELEASE_TABLET | Freq: Every day | ORAL | Status: DC

## 2015-01-15 NOTE — Progress Notes (Signed)
   Subjective:    Patient ID: Hector Lewis, male    DOB: 1941/09/05, 74 y.o.   MRN: [id number]  HPI Patient seen for complete physical. He has history of hyperlipidemia treated with atorvastatin and history of GERD which is controlled with pantoprazole 40 mg once daily. Recent right knee arthroscopic surgery and is recovering from that. He is starting to resume his exercise program. Never smoked. Had flu vaccine elsewhere this year.  Reviewed with no changes:  Past Medical History  Diagnosis Date  . HYPERLIPIDEMIA 05/30/2009  . 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  . METATARSALGIA 01/27/2010  . BUNION, LEFT FOOT 09/16/2008  . 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 MEDIAL MENISCUS TEAR, RIGHT 10/17/2008  . GERD 12/29/2010  . Hypertension    Past Surgical History  Procedure Laterality Date  . Hammer toe surgery  2012  . Colonectomy  2008  . Colon surgery  2008    precancerous polyps    reports that he quit smoking about 46 years ago. His smoking use included Cigarettes. He has a 6 pack-year smoking history. He does not have any smokeless tobacco history on file. His alcohol and drug histories are not on file. family history includes Cancer in his father; Hyperlipidemia in his mother. Allergies  Allergen Reactions  . Codeine Sulfate       Review of Systems  Constitutional: Negative for fever, activity change, appetite change and fatigue.  HENT: Negative for congestion, ear pain and trouble swallowing.   Eyes: Negative for pain and visual disturbance.  Respiratory: Negative for cough, shortness of breath and wheezing.   Cardiovascular: Negative for chest pain and palpitations.  Gastrointestinal: Negative for nausea, vomiting, abdominal pain, diarrhea, constipation, blood in stool, abdominal distention and rectal pain.  Genitourinary: Negative for dysuria, hematuria and testicular pain.  Musculoskeletal: Negative for joint swelling and arthralgias.  Skin: Negative for rash.  Neurological:  Negative for dizziness, syncope and headaches.  Hematological: Negative for adenopathy.  Psychiatric/Behavioral: Negative for confusion and dysphoric mood.       Objective:   Physical Exam  Constitutional: He is oriented to person, place, and time. He appears well-developed and well-nourished. No distress.  HENT:  Head: Normocephalic and atraumatic.  Right Ear: External ear normal.  Left Ear: External ear normal.  Mouth/Throat: Oropharynx is clear and moist.  Eyes: Conjunctivae and EOM are normal. Pupils are equal, round, and reactive to light.  Neck: Normal range of motion. Neck supple. No thyromegaly present.  Cardiovascular: Normal rate, regular rhythm and normal heart sounds.   No murmur heard. Pulmonary/Chest: No respiratory distress. He has no wheezes. He has no rales.  Abdominal: Soft. Bowel sounds are normal. He exhibits no distension and no mass. There is no tenderness. There is no rebound and no guarding.  Genitourinary: Rectum normal and prostate normal.  Musculoskeletal: He exhibits no edema.  Lymphadenopathy:    He has no cervical adenopathy.  Neurological: He is alert and oriented to person, place, and time. He displays normal reflexes. No cranial nerve deficit.  Skin: No rash noted.  He has some erythema of the frontal scalp region where he had recent chemical treatment for actinic keratoses per dermatology. This is healing well.  Psychiatric: He has a normal mood and affect.          Assessment & Plan:  Complete physical. Labs reviewed. No major concerns. Refill medications for one year. Resume regular exercise habits. Continue yearly flu vaccine. Will need tetanus booster next year.

## 2015-01-15 NOTE — Progress Notes (Signed)
Pre visit review using our clinic review tool, if applicable. No additional management support is needed unless otherwise documented below in the visit note. 

## 2015-10-30 ENCOUNTER — Ambulatory Visit (INDEPENDENT_AMBULATORY_CARE_PROVIDER_SITE_OTHER): Payer: Medicare Other | Admitting: Podiatry

## 2015-10-30 ENCOUNTER — Encounter: Payer: Self-pay | Admitting: Podiatry

## 2015-10-30 VITALS — BP 143/90 | HR 62 | Resp 12

## 2015-10-30 DIAGNOSIS — L600 Ingrowing nail: Secondary | ICD-10-CM

## 2015-10-30 NOTE — Progress Notes (Signed)
   Subjective:    Patient ID: Hector Lewis, male    DOB: 07-19-1941, 74 y.o.   MRN: [id number]  HPI B/L 4TH TOENAIL ARE THICK/DISCOLORATION FOR 1 YEAR. TOENAILS ARE GETTING WORSE ESPECIALLY WHEN WEARING CLOSED SHOES THE NAIL IRRITATE THE OTHER TOE. TRIED TO TRIM THEM DOWN.  Review of Systems  Skin: Positive for color change.       Objective:   Physical Exam        Assessment & Plan:

## 2015-10-30 NOTE — Progress Notes (Signed)
Subjective:     Patient ID: Hector Lewis, male   DOB: 10/15/1941, 74 y.o.   MRN: [id number]  HPI patient presents with damaged fourth nails of both feet stating that they are thick and he cannot cut them himself and the irritate the shoe gear and the toes     Review of Systems  All other systems reviewed and are negative.      Objective:   Physical Exam  Constitutional: He is oriented to person, place, and time.  Cardiovascular: Intact distal pulses.   Musculoskeletal: Normal range of motion.  Neurological: He is oriented to person, place, and time.  Skin: Skin is warm.  Nursing note and vitals reviewed.  neurovascular status intact muscle strength adequate range of motion within normal limits with patient noted to have thickened damaged fourth nails of both feet that are brittle and painful when pressed and making it difficult to wear shoe gear comfortably. Also complains of the way the irritate the toes adjacent     Assessment:     Damaged fourth nails bilateral with thick dystrophic changes and pain    Plan:     H&P conditions reviewed with patient. Today I recommended removal of the nails and I explained the surgery with patient and I reviewed what would be required. Patient wants to have these nails beds fixed and today I discussed the surgery and I explained risk. I infiltrated each toe with 60 mg Xylocaine Marcaine mixture remove the fourth nails exposed matrix and applied phenol for applications 30 seconds followed by alcohol lavage and sterile dressing. Gave instructions on soaks and reappoint

## 2015-10-30 NOTE — Patient Instructions (Signed)

## 2015-11-04 ENCOUNTER — Telehealth: Payer: Self-pay | Admitting: *Deleted

## 2015-11-04 NOTE — Telephone Encounter (Signed)
Called patient at [phone] (Cell #) to check to see how they were doing when they got two nails removed on Thursday, October 30, 2015. Pt stated, "Doing well and not in any pain". Pt is following the instructions that were given to him.

## 2015-11-10 ENCOUNTER — Telehealth: Payer: Self-pay | Admitting: *Deleted

## 2015-11-10 NOTE — Telephone Encounter (Signed)
Pt states the 4th toenails were removed about 3 weeks ago, now has scabbing without drainage, and very little tenderness.  I told pt to soak a couple more days, and allow to air dry, but cover with dry bandaid if in enclosed shoe, if area continues to be dry without redness, pain or drainage may stop the soak and dressing, but if begins again start soaking for a week or 2 and cover with antibiotic dressing then try to air dry again.  Pt states understanding.

## 2016-01-01 ENCOUNTER — Telehealth: Payer: Self-pay | Admitting: *Deleted

## 2016-01-01 NOTE — Telephone Encounter (Signed)
Pt states has questions about 10/2015 surgery.  Pt states had 4th toenails B/L removed and now has a dark scab that won't scrub off.  I told pt it may be the residual he is left with for the the next few weeks and will eventually slough off, but could make an appt with the nurse to be checked if concerned.  Pt states he washes and puts lotion on them, but still has the scabbing.  Pt states he'll wait another 2 weeks and call again.

## 2016-01-04 ENCOUNTER — Other Ambulatory Visit: Payer: Self-pay | Admitting: Family Medicine

## 2016-01-05 ENCOUNTER — Telehealth: Payer: Self-pay | Admitting: *Deleted

## 2016-01-05 NOTE — Telephone Encounter (Signed)
[facility] called checking on status of medical clearance for patient's total knee replacement scheduled for February 09, 2016. Spoke with Dr. Elease Hashimoto and he plans to incorporate clearance in patient's physical appointment already scheduled on February 6th.   [facility] Orthopedic representative: Elmyra Ricks  [phone]

## 2016-01-12 ENCOUNTER — Other Ambulatory Visit (INDEPENDENT_AMBULATORY_CARE_PROVIDER_SITE_OTHER): Payer: Medicare Other

## 2016-01-12 DIAGNOSIS — Z0000 Encounter for general adult medical examination without abnormal findings: Secondary | ICD-10-CM

## 2016-01-12 DIAGNOSIS — E785 Hyperlipidemia, unspecified: Secondary | ICD-10-CM

## 2016-01-12 DIAGNOSIS — Z125 Encounter for screening for malignant neoplasm of prostate: Secondary | ICD-10-CM

## 2016-01-12 LAB — LIPID PANEL
Cholesterol: 182 mg/dL (ref 0–200)
HDL: 55.2 mg/dL (ref >39.00)
LDL Cholesterol: 106 mg/dL — ABNORMAL HIGH (ref 0–99)
NonHDL: 126.32
Total CHOL/HDL Ratio: 3
Triglycerides: 104 mg/dL (ref 0.0–149.0)
VLDL: 20.8 mg/dL (ref 0.0–40.0)

## 2016-01-12 LAB — CBC WITH DIFFERENTIAL/PLATELET
Basophils Absolute: 0 10*3/uL (ref 0.0–0.1)
Basophils Relative: 0.7 % (ref 0.0–3.0)
Eosinophils Absolute: 0.2 10*3/uL (ref 0.0–0.7)
Eosinophils Relative: 3.7 % (ref 0.0–5.0)
HCT: 45.8 % (ref 39.0–52.0)
Hemoglobin: 15.5 g/dL (ref 13.0–17.0)
Lymphocytes Relative: 30.4 % (ref 12.0–46.0)
Lymphs Abs: 1.6 10*3/uL (ref 0.7–4.0)
MCHC: 33.8 g/dL (ref 30.0–36.0)
MCV: 93.6 fl (ref 78.0–100.0)
Monocytes Absolute: 0.4 10*3/uL (ref 0.1–1.0)
Monocytes Relative: 7.7 % (ref 3.0–12.0)
Neutro Abs: 3.1 10*3/uL (ref 1.4–7.7)
Neutrophils Relative %: 57.5 % (ref 43.0–77.0)
Platelets: 193 10*3/uL (ref 150.0–400.0)
RBC: 4.89 Mil/uL (ref 4.22–5.81)
RDW: 13.5 % (ref 11.5–15.5)
WBC: 5.4 10*3/uL (ref 4.0–10.5)

## 2016-01-12 LAB — BASIC METABOLIC PANEL
BUN: 16 mg/dL (ref 6–23)
CO2: 28 mEq/L (ref 19–32)
Calcium: 9.2 mg/dL (ref 8.4–10.5)
Chloride: 105 mEq/L (ref 96–112)
Creatinine, Ser: 0.93 mg/dL (ref 0.40–1.50)
GFR: 84.46 mL/min (ref >60.00)
Glucose, Bld: 99 mg/dL (ref 70–99)
Potassium: 4.8 mEq/L (ref 3.5–5.1)
Sodium: 141 mEq/L (ref 135–145)

## 2016-01-12 LAB — TSH: TSH: 2.85 u[IU]/mL (ref 0.35–4.50)

## 2016-01-12 LAB — HEPATIC FUNCTION PANEL
ALT: 29 U/L (ref 0–53)
AST: 27 U/L (ref 0–37)
Albumin: 4.4 g/dL (ref 3.5–5.2)
Alkaline Phosphatase: 71 U/L (ref 39–117)
Bilirubin, Direct: 0.1 mg/dL (ref 0.0–0.3)
Total Bilirubin: 0.8 mg/dL (ref 0.2–1.2)
Total Protein: 7 g/dL (ref 6.0–8.3)

## 2016-01-12 LAB — PSA: PSA: 0.75 ng/mL (ref 0.10–4.00)

## 2016-01-19 ENCOUNTER — Encounter: Payer: Self-pay | Admitting: Family Medicine

## 2016-01-19 ENCOUNTER — Ambulatory Visit (INDEPENDENT_AMBULATORY_CARE_PROVIDER_SITE_OTHER): Payer: Medicare Other | Admitting: Family Medicine

## 2016-01-19 VITALS — BP 136/96 | HR 64 | Temp 98.2°F | Ht 70.0 in | Wt 190.0 lb

## 2016-01-19 DIAGNOSIS — Z23 Encounter for immunization: Secondary | ICD-10-CM | POA: Diagnosis not present

## 2016-01-19 DIAGNOSIS — Z01818 Encounter for other preprocedural examination: Secondary | ICD-10-CM | POA: Diagnosis not present

## 2016-01-19 DIAGNOSIS — Z0000 Encounter for general adult medical examination without abnormal findings: Secondary | ICD-10-CM | POA: Diagnosis not present

## 2016-01-19 NOTE — Patient Instructions (Signed)
Health Maintenance  Topic Date Due  . PNA vac Low Risk Adult (2 of 2 - PPSV23) 01/18/2026 (Originally 01/11/2015)  . TETANUS/TDAP  03/13/2016  . INFLUENZA VACCINE  07/13/2016  . COLONOSCOPY  05/12/2023  . ZOSTAVAX  Completed

## 2016-01-19 NOTE — Progress Notes (Signed)
Pre visit review using our clinic review tool, if applicable. No additional management support is needed unless otherwise documented below in the visit note. 

## 2016-01-19 NOTE — Progress Notes (Signed)
Subjective:    Patient ID: Hector Lewis, male    DOB: [birth year], 75 y.o.   MRN: [id number]  HPI   Patient here for physical. He has history of hyperlipidemia, GERD , precancerous colon polyps , and osteoarthritis. He has planned right total knee replacement at the end of this month. He is exercising most days of the week. He has no history of cardiac disease or cerebrovascular disease. No recent chest pains. No history of hypertension.   Needs tetanus booster. Other immunizations are up-to-date. Colonoscopy up-to-date  Past Medical History  Diagnosis Date  . HYPERLIPIDEMIA 05/30/2009  . 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  . METATARSALGIA 01/27/2010  . BUNION, LEFT FOOT 09/16/2008  . 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 MEDIAL MENISCUS TEAR, RIGHT 10/17/2008  . GERD 12/29/2010  . Hypertension    Past Surgical History  Procedure Laterality Date  . Hammer toe surgery  2012  . Colonectomy  2008  . Colon surgery  2008    precancerous polyps    reports that he quit smoking about 47 years ago. His smoking use included Cigarettes. He has a 6 pack-year smoking history. He does not have any smokeless tobacco history on file. His alcohol and drug histories are not on file. family history includes Cancer in his father; Hyperlipidemia in his mother. Allergies  Allergen Reactions  . Codeine Sulfate   ]    Review of Systems  Constitutional: Negative for fever, activity change, appetite change, fatigue and unexpected weight change.  HENT: Negative for congestion, ear pain and trouble swallowing.   Eyes: Negative for pain and visual disturbance.  Respiratory: Negative for cough, chest tightness, shortness of breath and wheezing.   Cardiovascular: Negative for chest pain, palpitations and leg swelling.  Gastrointestinal: Negative for nausea, vomiting, abdominal pain, diarrhea, constipation, blood in stool, abdominal distention and rectal pain.  Endocrine: Negative for polydipsia and polyuria.    Genitourinary: Negative for dysuria, hematuria and testicular pain.  Musculoskeletal: Positive for arthralgias. Negative for joint swelling.  Skin: Negative for rash.  Neurological: Negative for dizziness, syncope, weakness, light-headedness and headaches.  Hematological: Negative for adenopathy.  Psychiatric/Behavioral: Negative for confusion and dysphoric mood.       Objective:   Physical Exam  Constitutional: He is oriented to person, place, and time. He appears well-developed and well-nourished. No distress.  HENT:  Head: Normocephalic and atraumatic.  Right Ear: External ear normal.  Left Ear: External ear normal.  Mouth/Throat: Oropharynx is clear and moist.  Eyes: Conjunctivae and EOM are normal. Pupils are equal, round, and reactive to light.  Neck: Normal range of motion. Neck supple. No thyromegaly present.  Cardiovascular: Normal rate, regular rhythm and normal heart sounds.   No murmur heard. Pulmonary/Chest: No respiratory distress. He has no wheezes. He has no rales.  Abdominal: Soft. Bowel sounds are normal. He exhibits no distension and no mass. There is no tenderness. There is no rebound and no guarding.  Musculoskeletal: He exhibits no edema.  Lymphadenopathy:    He has no cervical adenopathy.  Neurological: He is alert and oriented to person, place, and time. He displays normal reflexes. No cranial nerve deficit.  Skin: No rash noted.  Psychiatric: He has a normal mood and affect.          Assessment & Plan:   Physical exam. Labs reviewed with no major concerns. Tetanus booster given. Obtain EKG with scheduled upcoming surgery as above. Blood pressure was elevated on repeat 156/88. He is concerned about "whitecoat  syndrome ". Monitor closely at home and be in touch if consistently greater than 150/90.  EKG sinus rhythm with no acute changes.

## 2016-01-20 ENCOUNTER — Telehealth: Payer: Self-pay | Admitting: Family Medicine

## 2016-01-20 NOTE — Telephone Encounter (Signed)
[facility] called regarding a surgical clearance form on this patient.

## 2016-01-21 NOTE — Telephone Encounter (Signed)
i have not seen a form yet.

## 2016-01-21 NOTE — Telephone Encounter (Signed)
Faxed letter to ortho office.

## 2016-01-22 ENCOUNTER — Other Ambulatory Visit: Payer: Self-pay | Admitting: Orthopedic Surgery

## 2016-02-06 ENCOUNTER — Ambulatory Visit (HOSPITAL_COMMUNITY)
Admission: RE | Admit: 2016-02-06 | Discharge: 2016-02-06 | Disposition: A | Discharge status: Home / Self Care | Payer: Medicare Other | Source: Ambulatory Visit | Attending: Orthopedic Surgery | Admitting: Orthopedic Surgery

## 2016-02-06 ENCOUNTER — Encounter (HOSPITAL_COMMUNITY)
Admission: RE | Admit: 2016-02-06 | Discharge: 2016-02-06 | Disposition: A | Discharge status: Home / Self Care | Payer: Medicare Other | Source: Ambulatory Visit | Attending: Orthopedic Surgery | Admitting: Orthopedic Surgery

## 2016-02-06 ENCOUNTER — Encounter (HOSPITAL_COMMUNITY): Payer: Self-pay

## 2016-02-06 DIAGNOSIS — Z01812 Encounter for preprocedural laboratory examination: Secondary | ICD-10-CM | POA: Insufficient documentation

## 2016-02-06 DIAGNOSIS — Z01818 Encounter for other preprocedural examination: Secondary | ICD-10-CM

## 2016-02-06 HISTORY — DX: Malignant (primary) neoplasm, unspecified: C80.1

## 2016-02-06 HISTORY — DX: Thyrotoxicosis, unspecified without thyrotoxic crisis or storm: E05.90

## 2016-02-06 HISTORY — DX: Unspecified osteoarthritis, unspecified site: M19.90

## 2016-02-06 LAB — TYPE AND SCREEN
ABO/RH(D): O NEG
Antibody Screen: NEGATIVE

## 2016-02-06 LAB — CBC WITH DIFFERENTIAL/PLATELET
Basophils Absolute: 0 10*3/uL (ref 0.0–0.1)
Basophils Relative: 1 %
Eosinophils Absolute: 0.3 10*3/uL (ref 0.0–0.7)
Eosinophils Relative: 3 %
HCT: 44 % (ref 39.0–52.0)
Hemoglobin: 15.3 g/dL (ref 13.0–17.0)
Lymphocytes Relative: 24 %
Lymphs Abs: 1.9 10*3/uL (ref 0.7–4.0)
MCH: 31.5 pg (ref 26.0–34.0)
MCHC: 34.8 g/dL (ref 30.0–36.0)
MCV: 90.7 fL (ref 78.0–100.0)
Monocytes Absolute: 0.5 10*3/uL (ref 0.1–1.0)
Monocytes Relative: 6 %
Neutro Abs: 5.2 10*3/uL (ref 1.7–7.7)
Neutrophils Relative %: 66 %
Platelets: 197 10*3/uL (ref 150–400)
RBC: 4.85 MIL/uL (ref 4.22–5.81)
RDW: 12.7 % (ref 11.5–15.5)
WBC: 7.8 10*3/uL (ref 4.0–10.5)

## 2016-02-06 LAB — SURGICAL PCR SCREEN
MRSA, PCR: NEGATIVE
Staphylococcus aureus: POSITIVE — AB

## 2016-02-06 LAB — COMPREHENSIVE METABOLIC PANEL
ALT: 34 U/L (ref 17–63)
AST: 38 U/L (ref 15–41)
Albumin: 4 g/dL (ref 3.5–5.0)
Alkaline Phosphatase: 77 U/L (ref 38–126)
Anion gap: 16 — ABNORMAL HIGH (ref 5–15)
BUN: 16 mg/dL (ref 6–20)
CO2: 20 mmol/L — ABNORMAL LOW (ref 22–32)
Calcium: 9.7 mg/dL (ref 8.9–10.3)
Chloride: 104 mmol/L (ref 101–111)
Creatinine, Ser: 0.94 mg/dL (ref 0.61–1.24)
GFR calc Af Amer: >60 mL/min (ref >60)
GFR calc non Af Amer: >60 mL/min (ref >60)
Glucose, Bld: 86 mg/dL (ref 65–99)
Potassium: 4.2 mmol/L (ref 3.5–5.1)
Sodium: 140 mmol/L (ref 135–145)
Total Bilirubin: 0.8 mg/dL (ref 0.3–1.2)
Total Protein: 6.7 g/dL (ref 6.5–8.1)

## 2016-02-06 LAB — ABO/RH: ABO/RH(D): O NEG

## 2016-02-06 LAB — PROTIME-INR
INR: 0.99 (ref 0.00–1.49)
Prothrombin Time: 13.3 seconds (ref 11.6–15.2)

## 2016-02-06 LAB — APTT: aPTT: 32 seconds (ref 24–37)

## 2016-02-06 NOTE — Progress Notes (Signed)
Pt. Reports having a stress test about 40 yrs. Ago, told that it was wnl, no need for further follow up with cardiology.  Pt. 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 Pt. Denies all chest concerns. Followed by Dr. Elease Hashimoto, had recent EKG, will ask for anesth. Review due to   ekg interpretation.

## 2016-02-06 NOTE — Pre-Procedure Instructions (Signed)
Hector Lewis  02/06/2016      [facility] - [place], [place] - [address] SEC OF [address] [place] [address] Phone: [phone] Fax: [phone]    Your procedure is scheduled on 02/16/2016.  Report to [facility] Admitting at 8:45 A.M.  Call this number if you have problems the morning of surgery:  [phone]   Remember:  Do not eat food or drink liquids after midnight.  On Sunday   Take these medicines the morning of surgery with A SIP OF WATER: Protonix    Do not wear jewelry   Do not wear lotions, powders, or perfumes.  You may wear deodorant.    Men may shave face and neck.   Do not bring valuables to the hospital.   [facility] is not responsible for any belongings or valuables.  Contacts, dentures or bridgework may not be worn into surgery.  Leave your suitcase in the car.  After surgery it may be brought to your room.  For patients admitted to the hospital, discharge time will be determined by your treatment team.  Patients discharged the day of surgery will not be allowed to drive home.   Name and phone number of your driver:   Wife - Trudy  Special instructions:  Special Instructions: [facility] - Preparing for Surgery  Before surgery, you can play an important role.  Because skin is not sterile, your skin needs to be as free of germs as possible.  You can reduce the number of germs on you skin by washing with CHG (chlorahexidine gluconate) soap before surgery.  CHG is an antiseptic cleaner which kills germs and bonds with the skin to continue killing germs even after washing.  Please DO NOT use if you have an allergy to CHG or antibacterial soaps.  If your skin becomes reddened/irritated stop using the CHG and inform your nurse when you arrive at Short Stay.  Do not shave (including legs and underarms) for at least 48 hours prior to the first CHG shower.  You may shave your  face.  Please follow these instructions carefully:   1.  Shower with CHG Soap the night before surgery and the  morning of Surgery.  2.  If you choose to wash your hair, wash your hair first as usual with your  normal shampoo.  3.  After you shampoo, rinse your hair and body thoroughly to remove the  Shampoo.  4.  Use CHG as you would any other liquid soap.  You can apply chg directly to the skin and wash gently with scrungie or a clean washcloth.  5.  Apply the CHG Soap to your body ONLY FROM THE NECK DOWN.    Do not use on open wounds or open sores.  Avoid contact with your eyes, ears, mouth and genitals (private parts).  Wash genitals (private parts)   with your normal soap.  6.  Wash thoroughly, paying special attention to the area where your surgery will be performed.  7.  Thoroughly rinse your body with warm water from the neck down.  8.  DO NOT shower/wash with your normal soap after using and rinsing off   the CHG Soap.  9.  Pat yourself dry with a clean towel.            10 .  Wear clean pajamas.  11.  Place clean sheets on your bed the night of your first shower and do not sleep with pets.  Day of Surgery  Do not apply any lotions/deodorants the morning of surgery.  Please wear clean clothes to the hospital/surgery center.  Please read over the following fact sheets that you were given. Pain Booklet, Coughing and Deep Breathing, Blood Transfusion Information, MRSA Information and Surgical Site Infection Prevention

## 2016-02-09 NOTE — Progress Notes (Signed)
Anesthesia Chart Review:  Pt is a 75 year old male scheduled for R total knee arthroplasty on 02/16/2016 with Dr. Berenice Primas.   PCP is Dr. Carolann Littler, who is aware of upcoming surgery.   PMH includes:  HTN, hyperlipidemia, hyperthyroidism (1980's). Former smoker. BMI 73.  Medications include: ASA, lipitor, protonix, rifaximin.   Preoperative labs reviewed.    Chest x-ray 02/06/16 reviewed. No acute abnormality noted.  EKG 01/19/16:  - Sinus  Rhythm. 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  - Anteroseptal infarct -age undetermined.  - No significant change since 2004 per Dr. Erick Blinks interpretation.   If no changes, I anticipate pt can proceed with surgery as scheduled.   Willeen Cass, FNP-BC [facility] Short Stay Surgical Center/Anesthesiology Phone: [phone] 02/09/2016 3:41 PM

## 2016-02-12 ENCOUNTER — Other Ambulatory Visit (HOSPITAL_COMMUNITY): Payer: Medicare Other

## 2016-02-13 MED ORDER — CHLORHEXIDINE GLUCONATE 4 % EX LIQD: 60.0000 mL | Freq: Once | CUTANEOUS | Status: DC

## 2016-02-13 MED ORDER — CEFAZOLIN SODIUM-DEXTROSE 2-3 GM-% IV SOLR
2.0000 g | INTRAVENOUS | Status: AC
  Administered 2016-02-16: 2 g via INTRAVENOUS
  Filled 2016-02-13: qty 50

## 2016-02-15 NOTE — H&P (Signed)
TOTAL KNEE ADMISSION H&P  Patient is being admitted for right total knee arthroplasty.  Subjective:  Chief Complaint:right knee pain.  HPI: Hector Lewis, 75 y.o. male, has a history of pain and functional disability in the right knee due to arthritis and has failed non-surgical conservative treatments for greater than 12 weeks to includeNSAID's and/or analgesics, corticosteriod injections, viscosupplementation injections, flexibility and strengthening excercises and activity modification.  Onset of symptoms was gradual, starting 5 years ago with gradually worsening course since that time. The patient noted prior procedures on the knee to include  arthroscopy and menisectomy on the right knee(s).  Patient currently rates pain in the right knee(s) at 9 out of 10 with activity. Patient has night pain, worsening of pain with activity and weight bearing, pain that interferes with activities of daily living, pain with passive range of motion, crepitus and joint swelling.  Patient has evidence of subchondral cysts, subchondral sclerosis, periarticular osteophytes and joint space narrowing by imaging studies. This patient has had failure of all reasonable conservative care. There is no active infection.  Patient Active Problem List   Diagnosis Date Noted  . Right knee pain 08/03/2013  . GERD 12/29/2010  . 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  . METATARSALGIA 01/27/2010  . VIRAL INFECTION 01/23/2010  . HYPERLIPIDEMIA 05/30/2009  . MEDIAL MENISCUS TEAR, RIGHT 10/17/2008  . KNEE PAIN, RIGHT 09/16/2008  . BUNION, LEFT FOOT 09/16/2008  . 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   Past Medical History  Diagnosis Date  . HYPERLIPIDEMIA 05/30/2009  . 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  . METATARSALGIA 01/27/2010  . BUNION, LEFT FOOT 09/16/2008  . 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 MEDIAL MENISCUS TEAR, RIGHT 10/17/2008  . GERD 12/29/2010  . Hypertension   . Hyperthyroidism 1980's    resolved now, took medicine at the  time  . Arthritis   . 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 Hammer toe surgery  2012  . Colonectomy  2008  . Colon surgery  2008    precancerous polyps  . Tonsillectomy    . Appendectomy      done with colectomy  . Knee arthroscopy Right     No prescriptions prior to admission   Allergies  Allergen Reactions  . Codeine Sulfate Itching and Rash    Social History  Substance Use Topics  . Smoking status: Former Smoker -- 1.00 packs/day for 6 years    Types: Cigarettes    Quit date: 06/09/1968  . Smokeless tobacco: Not on file  . Alcohol Use: Yes     Comment: 2 glasses of wine/night     Family History  Problem Relation Age of Onset  . Cancer Father     colon, prostate  . Hyperlipidemia Mother      ROS ROS: I have reviewed the patient's review of systems thoroughly and there are no positive responses as relates to the HPI. Objective:  Physical Exam  Vital signs in last 24 hours:   Well-developed well-nourished patient in no acute distress. Alert and oriented x3 HEENT:within normal limits Cardiac: Regular rate and rhythm Pulmonary: Lungs clear to auscultation Abdomen: Soft and nontender.  Normal active bowel sounds  Musculoskeletal: right knee: Painful range of motion.  No instability.  Trace effusion.  Range of motion 5-115.  Mild valgus malalignment.  Neurovascularly intact distally. Labs: Recent Results (from the past 2160 hour(s))  CBC with Differential/Platelet     Status: None  Collection Time: 01/12/16  8:21 AM  Result Value Ref Range   WBC 5.4 4.0 - 10.5 K/uL   RBC 4.89 4.22 - 5.81 Mil/uL   Hemoglobin 15.5 13.0 - 17.0 g/dL   HCT 45.8 39.0 - 52.0 %   MCV 93.6 78.0 - 100.0 fl   MCHC 33.8 30.0 - 36.0 g/dL   RDW 13.5 11.5 - 15.5 %   Platelets 193.0 150.0 - 400.0 K/uL   Neutrophils Relative % 57.5 43.0 - 77.0 %   Lymphocytes Relative 30.4 12.0 - 46.0 %   Monocytes Relative 7.7 3.0 - 12.0 %    Eosinophils Relative 3.7 0.0 - 5.0 %   Basophils Relative 0.7 0.0 - 3.0 %   Neutro Abs 3.1 1.4 - 7.7 K/uL   Lymphs Abs 1.6 0.7 - 4.0 K/uL   Monocytes Absolute 0.4 0.1 - 1.0 K/uL   Eosinophils Absolute 0.2 0.0 - 0.7 K/uL   Basophils Absolute 0.0 0.0 - 0.1 K/uL  Hepatic function panel     Status: None   Collection Time: 01/12/16  8:21 AM  Result Value Ref Range   Total Bilirubin 0.8 0.2 - 1.2 mg/dL   Bilirubin, Direct 0.1 0.0 - 0.3 mg/dL   Alkaline Phosphatase 71 39 - 117 U/L   AST 27 0 - 37 U/L   ALT 29 0 - 53 U/L   Total Protein 7.0 6.0 - 8.3 g/dL   Albumin 4.4 3.5 - 5.2 g/dL  TSH     Status: None   Collection Time: 01/12/16  8:21 AM  Result Value Ref Range   TSH 2.85 0.35 - 4.50 uIU/mL  PSA     Status: None   Collection Time: 01/12/16  8:21 AM  Result Value Ref Range   PSA 0.75 0.10 - 4.00 ng/mL  Lipid panel     Status: Abnormal   Collection Time: 01/12/16  8:21 AM  Result Value Ref Range   Cholesterol 182 0 - 200 mg/dL    Comment: ATP III Classification       Desirable:  < 200 mg/dL               Borderline High:  200 - 239 mg/dL          High:  > = 240 mg/dL   Triglycerides 104.0 0.0 - 149.0 mg/dL    Comment: Normal:  <150 mg/dLBorderline High:  150 - 199 mg/dL   HDL 55.20 >39.00 mg/dL   VLDL 20.8 0.0 - 40.0 mg/dL   LDL Cholesterol 106 (H) 0 - 99 mg/dL   Total CHOL/HDL Ratio 3     Comment:                Men          Women1/2 Average Risk     3.4          3.3Average Risk          5.0          4.42X Average Risk          9.6          7.13X Average Risk          15.0          11.0                       NonHDL 126.32     Comment: NOTE:  Non-HDL goal should be 30 mg/dL higher than patient's LDL goal (i.e.  LDL goal of < 70 mg/dL, would have non-HDL goal of < 100 mg/dL)  Basic metabolic panel     Status: None   Collection Time: 01/12/16  8:21 AM  Result Value Ref Range   Sodium 141 135 - 145 mEq/L   Potassium 4.8 3.5 - 5.1 mEq/L   Chloride 105 96 - 112 mEq/L   CO2 28 19 - 32  mEq/L   Glucose, Bld 99 70 - 99 mg/dL   BUN 16 6 - 23 mg/dL   Creatinine, Ser 0.93 0.40 - 1.50 mg/dL   Calcium 9.2 8.4 - 10.5 mg/dL   GFR 84.46 >60.00 mL/min  Surgical pcr screen     Status: Abnormal   Collection Time: 02/06/16  4:09 PM  Result Value Ref Range   MRSA, PCR NEGATIVE NEGATIVE   Staphylococcus aureus POSITIVE (A) NEGATIVE    Comment:        The Xpert SA Assay (FDA approved for NASAL specimens in patients over 28 years of age), is one component of a comprehensive surveillance program.  Test performance has been validated by [facility] for patients greater than or equal to 40 year old. It is not intended to diagnose infection nor to guide or monitor treatment.   Type and screen     Status: None   Collection Time: 02/06/16  4:30 PM  Result Value Ref Range   ABO/RH(D) O NEG    Antibody Screen NEG    Sample Expiration 02/20/2016    Extend sample reason NO TRANSFUSIONS OR PREGNANCY IN THE PAST 3 MONTHS   ABO/Rh     Status: None   Collection Time: 02/06/16  4:30 PM  Result Value Ref Range   ABO/RH(D) O NEG   APTT     Status: None   Collection Time: 02/06/16  5:24 PM  Result Value Ref Range   aPTT 32 24 - 37 seconds  CBC WITH DIFFERENTIAL     Status: None   Collection Time: 02/06/16  5:24 PM  Result Value Ref Range   WBC 7.8 4.0 - 10.5 K/uL   RBC 4.85 4.22 - 5.81 MIL/uL   Hemoglobin 15.3 13.0 - 17.0 g/dL   HCT 44.0 39.0 - 52.0 %   MCV 90.7 78.0 - 100.0 fL   MCH 31.5 26.0 - 34.0 pg   MCHC 34.8 30.0 - 36.0 g/dL   RDW 12.7 11.5 - 15.5 %   Platelets 197 150 - 400 K/uL   Neutrophils Relative % 66 %   Neutro Abs 5.2 1.7 - 7.7 K/uL   Lymphocytes Relative 24 %   Lymphs Abs 1.9 0.7 - 4.0 K/uL   Monocytes Relative 6 %   Monocytes Absolute 0.5 0.1 - 1.0 K/uL   Eosinophils Relative 3 %   Eosinophils Absolute 0.3 0.0 - 0.7 K/uL   Basophils Relative 1 %   Basophils Absolute 0.0 0.0 - 0.1 K/uL  Comprehensive metabolic panel     Status: Abnormal   Collection Time:  02/06/16  5:24 PM  Result Value Ref Range   Sodium 140 135 - 145 mmol/L   Potassium 4.2 3.5 - 5.1 mmol/L   Chloride 104 101 - 111 mmol/L   CO2 20 (L) 22 - 32 mmol/L   Glucose, Bld 86 65 - 99 mg/dL   BUN 16 6 - 20 mg/dL   Creatinine, Ser 0.94 0.61 - 1.24 mg/dL   Calcium 9.7 8.9 - 10.3 mg/dL   Total Protein 6.7 6.5 - 8.1 g/dL   Albumin  4.0 3.5 - 5.0 g/dL   AST 38 15 - 41 U/L   ALT 34 17 - 63 U/L   Alkaline Phosphatase 77 38 - 126 U/L   Total Bilirubin 0.8 0.3 - 1.2 mg/dL   GFR calc non Af Amer >60 >60 mL/min   GFR calc Af Amer >60 >60 mL/min    Comment: (NOTE) The eGFR has been calculated using the CKD EPI equation. This calculation has not been validated in all clinical situations. eGFR's persistently <60 mL/min signify possible Chronic Kidney Disease.    Anion gap 16 (H) 5 - 15  Protime-INR     Status: None   Collection Time: 02/06/16  5:24 PM  Result Value Ref Range   Prothrombin Time 13.3 11.6 - 15.2 seconds   INR 0.99 0.00 - 1.49    Estimated body mass index is 27.26 kg/(m^2) as calculated from the following:   Height as of 01/19/16: _0  (1.778 m).   Weight as of 01/19/16: 86.183 kg (190 lb).   Imaging Review Plain radiographs demonstrate severe degenerative joint disease of the right knee(s). The overall alignment ismild valgus. The bone quality appears to be fair for age and reported activity level.  Assessment/Plan:  End stage arthritis, right knee   The patient history, physical examination, clinical judgment of the provider and imaging studies are consistent with end stage degenerative joint disease of the right knee(s) and total knee arthroplasty is deemed medically necessary. The treatment options including medical management, injection therapy arthroscopy and arthroplasty were discussed at length. The risks and benefits of total knee arthroplasty were presented and reviewed. The risks due to aseptic loosening, infection, stiffness, patella tracking problems,  thromboembolic complications and other imponderables were discussed. The patient acknowledged the explanation, agreed to proceed with the plan and consent was signed. Patient is being admitted for inpatient treatment for surgery, pain control, PT, OT, prophylactic antibiotics, VTE prophylaxis, progressive ambulation and ADL's and discharge planning. The patient is planning to be discharged home with home health services

## 2016-02-16 ENCOUNTER — Inpatient Hospital Stay (HOSPITAL_COMMUNITY): Payer: Medicare Other | Admitting: Emergency Medicine

## 2016-02-16 ENCOUNTER — Encounter (HOSPITAL_COMMUNITY): Payer: Self-pay | Admitting: *Deleted

## 2016-02-16 ENCOUNTER — Encounter (HOSPITAL_COMMUNITY)
Admission: AD | Disposition: A | Discharge status: Home / Self Care | Payer: Self-pay | Source: Ambulatory Visit | Attending: Orthopedic Surgery

## 2016-02-16 ENCOUNTER — Inpatient Hospital Stay (HOSPITAL_COMMUNITY)
Admission: AD | Admit: 2016-02-16 | Discharge: 2016-02-18 | DRG: 470 | Disposition: A | Discharge status: Home / Self Care | Payer: Medicare Other | Source: Ambulatory Visit | Attending: Orthopedic Surgery | Admitting: Orthopedic Surgery

## 2016-02-16 DIAGNOSIS — M1711 Unilateral primary osteoarthritis, right knee: Principal | ICD-10-CM | POA: Diagnosis present

## 2016-02-16 DIAGNOSIS — Z885 Allergy status to narcotic agent status: Secondary | ICD-10-CM

## 2016-02-16 DIAGNOSIS — K219 Gastro-esophageal reflux disease without esophagitis: Secondary | ICD-10-CM | POA: Diagnosis present

## 2016-02-16 DIAGNOSIS — Z87891 Personal history of nicotine dependence: Secondary | ICD-10-CM

## 2016-02-16 DIAGNOSIS — I10 Essential (primary) hypertension: Secondary | ICD-10-CM | POA: Diagnosis present

## 2016-02-16 DIAGNOSIS — E785 Hyperlipidemia, unspecified: Secondary | ICD-10-CM | POA: Diagnosis present

## 2016-02-16 DIAGNOSIS — Z79899 Other long term (current) drug therapy: Secondary | ICD-10-CM

## 2016-02-16 HISTORY — PX: TOTAL KNEE ARTHROPLASTY: SHX125

## 2016-02-16 SURGERY — ARTHROPLASTY, KNEE, TOTAL
Anesthesia: Spinal | Laterality: Right

## 2016-02-16 MED ORDER — DEXAMETHASONE SODIUM PHOSPHATE 10 MG/ML IJ SOLN
INTRAMUSCULAR | Status: DC | PRN
  Administered 2016-02-16: 10 mg via INTRAVENOUS

## 2016-02-16 MED ORDER — PANTOPRAZOLE SODIUM 40 MG PO TBEC
40.0000 mg | DELAYED_RELEASE_TABLET | Freq: Every day | ORAL | Status: DC
  Administered 2016-02-17 – 2016-02-18 (×2): 40 mg via ORAL
  Filled 2016-02-16 (×2): qty 1

## 2016-02-16 MED ORDER — ALUM & MAG HYDROXIDE-SIMETH 200-200-20 MG/5ML PO SUSP: 30.0000 mL | ORAL | Status: DC | PRN

## 2016-02-16 MED ORDER — ATROPINE SULFATE 0.1 MG/ML IJ SOLN
0.4000 mg | Freq: Once | INTRAMUSCULAR | Status: AC
  Administered 2016-02-16: 0.4 mg via INTRAVENOUS

## 2016-02-16 MED ORDER — DIPHENHYDRAMINE HCL 12.5 MG/5ML PO ELIX: 12.5000 mg | ORAL_SOLUTION | ORAL | Status: DC | PRN

## 2016-02-16 MED ORDER — ATORVASTATIN CALCIUM 20 MG PO TABS
20.0000 mg | ORAL_TABLET | ORAL | Status: DC
  Administered 2016-02-17: 20 mg via ORAL
  Filled 2016-02-16: qty 1

## 2016-02-16 MED ORDER — DOCUSATE SODIUM 100 MG PO CAPS
100.0000 mg | ORAL_CAPSULE | Freq: Two times a day (BID) | ORAL | Status: DC
  Administered 2016-02-16 – 2016-02-18 (×4): 100 mg via ORAL
  Filled 2016-02-16 (×4): qty 1

## 2016-02-16 MED ORDER — TIZANIDINE HCL 2 MG PO TABS: 2.0000 mg | ORAL_TABLET | Freq: Three times a day (TID) | ORAL | Status: DC | PRN

## 2016-02-16 MED ORDER — GLYCOPYRROLATE 0.2 MG/ML IJ SOLN
0.2000 mg | Freq: Once | INTRAMUSCULAR | Status: AC
  Administered 2016-02-16: 0.2 mg via INTRAVENOUS

## 2016-02-16 MED ORDER — HYDROMORPHONE HCL 1 MG/ML IJ SOLN
0.5000 mg | INTRAMUSCULAR | Status: DC | PRN
  Administered 2016-02-18: 1 mg via INTRAVENOUS
  Filled 2016-02-16: qty 1

## 2016-02-16 MED ORDER — METHOCARBAMOL 1000 MG/10ML IJ SOLN
500.0000 mg | Freq: Four times a day (QID) | INTRAMUSCULAR | Status: DC | PRN
  Filled 2016-02-16: qty 5

## 2016-02-16 MED ORDER — ACETAMINOPHEN 325 MG PO TABS: 650.0000 mg | ORAL_TABLET | Freq: Four times a day (QID) | ORAL | Status: DC | PRN

## 2016-02-16 MED ORDER — BUPIVACAINE IN DEXTROSE 0.75-8.25 % IT SOLN
INTRATHECAL | Status: DC | PRN
  Administered 2016-02-16: 1.8 mL via INTRATHECAL

## 2016-02-16 MED ORDER — TRANEXAMIC ACID 1000 MG/10ML IV SOLN
1000.0000 mg | INTRAVENOUS | Status: AC
  Administered 2016-02-16: 1000 mg via INTRAVENOUS
  Filled 2016-02-16: qty 10

## 2016-02-16 MED ORDER — MIDAZOLAM HCL 5 MG/5ML IJ SOLN
INTRAMUSCULAR | Status: DC | PRN
  Administered 2016-02-16 (×2): 1 mg via INTRAVENOUS

## 2016-02-16 MED ORDER — HYDROMORPHONE HCL 1 MG/ML IJ SOLN: 0.2500 mg | INTRAMUSCULAR | Status: DC | PRN

## 2016-02-16 MED ORDER — OXYCODONE-ACETAMINOPHEN 5-325 MG PO TABS: 1.0000 | ORAL_TABLET | ORAL | Status: DC | PRN

## 2016-02-16 MED ORDER — BISACODYL 5 MG PO TBEC: 5.0000 mg | DELAYED_RELEASE_TABLET | Freq: Every day | ORAL | Status: DC | PRN

## 2016-02-16 MED ORDER — ONDANSETRON HCL 4 MG PO TABS
4.0000 mg | ORAL_TABLET | Freq: Four times a day (QID) | ORAL | Status: DC | PRN
  Filled 2016-02-16: qty 1

## 2016-02-16 MED ORDER — ASPIRIN EC 325 MG PO TBEC
325.0000 mg | DELAYED_RELEASE_TABLET | Freq: Two times a day (BID) | ORAL | Status: DC
  Administered 2016-02-16 – 2016-02-18 (×4): 325 mg via ORAL
  Filled 2016-02-16 (×4): qty 1

## 2016-02-16 MED ORDER — FENTANYL CITRATE (PF) 250 MCG/5ML IJ SOLN
INTRAMUSCULAR | Status: AC
  Filled 2016-02-16: qty 5

## 2016-02-16 MED ORDER — LACTATED RINGERS IV SOLN
INTRAVENOUS | Status: DC
  Administered 2016-02-16 (×2): via INTRAVENOUS

## 2016-02-16 MED ORDER — GLYCOPYRROLATE 0.2 MG/ML IJ SOLN
INTRAMUSCULAR | Status: AC
  Filled 2016-02-16: qty 1

## 2016-02-16 MED ORDER — BUPIVACAINE HCL (PF) 0.5 % IJ SOLN
INTRAMUSCULAR | Status: AC
  Filled 2016-02-16: qty 30

## 2016-02-16 MED ORDER — BUPIVACAINE LIPOSOME 1.3 % IJ SUSP
20.0000 mL | INTRAMUSCULAR | Status: DC
  Filled 2016-02-16: qty 20

## 2016-02-16 MED ORDER — ASPIRIN EC 325 MG PO TBEC: 325.0000 mg | DELAYED_RELEASE_TABLET | Freq: Two times a day (BID) | ORAL | Status: DC

## 2016-02-16 MED ORDER — ONDANSETRON HCL 4 MG/2ML IJ SOLN
INTRAMUSCULAR | Status: DC | PRN
  Administered 2016-02-16: 4 mg via INTRAVENOUS

## 2016-02-16 MED ORDER — DEXAMETHASONE SODIUM PHOSPHATE 10 MG/ML IJ SOLN
INTRAMUSCULAR | Status: AC
  Filled 2016-02-16: qty 1

## 2016-02-16 MED ORDER — PROPOFOL 500 MG/50ML IV EMUL
INTRAVENOUS | Status: DC | PRN
  Administered 2016-02-16: 75 ug/kg/min via INTRAVENOUS

## 2016-02-16 MED ORDER — METHOCARBAMOL 500 MG PO TABS
500.0000 mg | ORAL_TABLET | Freq: Four times a day (QID) | ORAL | Status: DC | PRN
  Administered 2016-02-16 – 2016-02-18 (×5): 500 mg via ORAL
  Filled 2016-02-16 (×6): qty 1

## 2016-02-16 MED ORDER — DEXAMETHASONE SODIUM PHOSPHATE 10 MG/ML IJ SOLN
10.0000 mg | Freq: Two times a day (BID) | INTRAMUSCULAR | Status: AC
  Administered 2016-02-16 – 2016-02-17 (×2): 10 mg via INTRAVENOUS
  Filled 2016-02-16 (×2): qty 1

## 2016-02-16 MED ORDER — POLYETHYLENE GLYCOL 3350 17 G PO PACK: 17.0000 g | PACK | Freq: Every day | ORAL | Status: DC | PRN

## 2016-02-16 MED ORDER — MIDAZOLAM HCL 2 MG/2ML IJ SOLN
INTRAMUSCULAR | Status: AC
  Filled 2016-02-16: qty 2

## 2016-02-16 MED ORDER — ZOLPIDEM TARTRATE 5 MG PO TABS: 5.0000 mg | ORAL_TABLET | Freq: Every evening | ORAL | Status: DC | PRN

## 2016-02-16 MED ORDER — ROPIVACAINE HCL 5 MG/ML IJ SOLN
INTRAMUSCULAR | Status: DC | PRN
  Administered 2016-02-16: 30 mL via PERINEURAL

## 2016-02-16 MED ORDER — FENTANYL CITRATE (PF) 100 MCG/2ML IJ SOLN
INTRAMUSCULAR | Status: DC | PRN
  Administered 2016-02-16 (×2): 50 ug via INTRAVENOUS

## 2016-02-16 MED ORDER — ONDANSETRON HCL 4 MG/2ML IJ SOLN
4.0000 mg | Freq: Four times a day (QID) | INTRAMUSCULAR | Status: DC | PRN
  Filled 2016-02-16: qty 2

## 2016-02-16 MED ORDER — SODIUM CHLORIDE 0.9 % IR SOLN
Status: DC | PRN
  Administered 2016-02-16: 3000 mL

## 2016-02-16 MED ORDER — ATROPINE SULFATE 0.1 MG/ML IJ SOLN
INTRAMUSCULAR | Status: AC
  Filled 2016-02-16: qty 10

## 2016-02-16 MED ORDER — OXYCODONE HCL 5 MG PO TABS
5.0000 mg | ORAL_TABLET | ORAL | Status: DC | PRN
  Administered 2016-02-16 – 2016-02-18 (×12): 10 mg via ORAL
  Filled 2016-02-16 (×13): qty 2

## 2016-02-16 MED ORDER — MAGNESIUM CITRATE PO SOLN: 1.0000 | Freq: Once | ORAL | Status: DC | PRN

## 2016-02-16 MED ORDER — GLYCOPYRROLATE 0.2 MG/ML IJ SOLN: 0.1000 mg | Freq: Once | INTRAMUSCULAR | Status: DC

## 2016-02-16 MED ORDER — TRANEXAMIC ACID 1000 MG/10ML IV SOLN
1000.0000 mg | Freq: Once | INTRAVENOUS | Status: AC
  Administered 2016-02-16: 1000 mg via INTRAVENOUS
  Filled 2016-02-16: qty 10

## 2016-02-16 MED ORDER — CEFAZOLIN SODIUM-DEXTROSE 2-3 GM-% IV SOLR
2.0000 g | Freq: Four times a day (QID) | INTRAVENOUS | Status: AC
  Administered 2016-02-16 (×2): 2 g via INTRAVENOUS
  Filled 2016-02-16 (×3): qty 50

## 2016-02-16 MED ORDER — ONDANSETRON HCL 4 MG/2ML IJ SOLN
INTRAMUSCULAR | Status: AC
  Filled 2016-02-16: qty 2

## 2016-02-16 MED ORDER — 0.9 % SODIUM CHLORIDE (POUR BTL) OPTIME
TOPICAL | Status: DC | PRN
  Administered 2016-02-16: 1000 mL

## 2016-02-16 MED ORDER — ACETAMINOPHEN 650 MG RE SUPP: 650.0000 mg | Freq: Four times a day (QID) | RECTAL | Status: DC | PRN

## 2016-02-16 MED ORDER — PHENYLEPHRINE HCL 10 MG/ML IJ SOLN
10.0000 mg | INTRAVENOUS | Status: DC | PRN
  Administered 2016-02-16: 15 ug/min via INTRAVENOUS

## 2016-02-16 MED ORDER — SODIUM CHLORIDE 0.9 % IV SOLN
INTRAVENOUS | Status: DC
  Administered 2016-02-16: 17:00:00 via INTRAVENOUS

## 2016-02-16 MED ORDER — MIDAZOLAM HCL 2 MG/2ML IJ SOLN
INTRAMUSCULAR | Status: AC
  Administered 2016-02-16: 1 mg
  Filled 2016-02-16: qty 2

## 2016-02-16 MED ORDER — FENTANYL CITRATE (PF) 100 MCG/2ML IJ SOLN
INTRAMUSCULAR | Status: AC
  Administered 2016-02-16: 50 ug
  Filled 2016-02-16: qty 2

## 2016-02-16 SURGICAL SUPPLY — 62 items
BANDAGE ESMARK 6X9 LF (GAUZE/BANDAGES/DRESSINGS) ×1 IMPLANT
BENZOIN TINCTURE PRP APPL 2/3 (GAUZE/BANDAGES/DRESSINGS) ×2 IMPLANT
BLADE SAGITTAL 25.0X1.19X90 (BLADE) ×2 IMPLANT
BLADE SAW SAG 90X13X1.27 (BLADE) ×2 IMPLANT
BNDG ESMARK 6X9 LF (GAUZE/BANDAGES/DRESSINGS) ×2
BOWL SMART MIX CTS (DISPOSABLE) ×2 IMPLANT
CAP KNEE TOTAL 3 SIGMA ×2 IMPLANT
CEMENT HV SMART SET (Cement) ×4 IMPLANT
COVER SURGICAL LIGHT HANDLE (MISCELLANEOUS) ×2 IMPLANT
CUFF TOURNIQUET SINGLE 34IN LL (TOURNIQUET CUFF) ×2 IMPLANT
CUFF TOURNIQUET SINGLE 44IN (TOURNIQUET CUFF) IMPLANT
DRAPE EXTREMITY T 121X128X90 (DRAPE) ×2 IMPLANT
DRAPE IMP U-DRAPE 54X76 (DRAPES) ×2 IMPLANT
DRAPE U-SHAPE 47X51 STRL (DRAPES) ×2 IMPLANT
DRSG AQUACEL AG ADV 3.5X10 (GAUZE/BANDAGES/DRESSINGS) ×2 IMPLANT
DRSG MEPILEX BORDER 4X12 (GAUZE/BANDAGES/DRESSINGS) ×2 IMPLANT
DRSG PAD ABDOMINAL 8X10 ST (GAUZE/BANDAGES/DRESSINGS) ×2 IMPLANT
DURAPREP 26ML APPLICATOR (WOUND CARE) ×2 IMPLANT
ELECT REM PT RETURN 9FT ADLT (ELECTROSURGICAL) ×2
ELECTRODE REM PT RTRN 9FT ADLT (ELECTROSURGICAL) ×1 IMPLANT
EVACUATOR 1/8 PVC DRAIN (DRAIN) ×2 IMPLANT
FACESHIELD WRAPAROUND (MASK) ×2 IMPLANT
GAUZE SPONGE 4X4 12PLY STRL (GAUZE/BANDAGES/DRESSINGS) ×2 IMPLANT
GLOVE BIOGEL PI IND STRL 6.5 (GLOVE) ×2 IMPLANT
GLOVE BIOGEL PI IND STRL 8 (GLOVE) ×2 IMPLANT
GLOVE BIOGEL PI INDICATOR 6.5 (GLOVE) ×2
GLOVE BIOGEL PI INDICATOR 8 (GLOVE) ×2
GLOVE ECLIPSE 7.5 STRL STRAW (GLOVE) ×4 IMPLANT
GLOVE SURG SS PI 6.5 STRL IVOR (GLOVE) ×6 IMPLANT
GOWN STRL REUS W/ TWL LRG LVL3 (GOWN DISPOSABLE) ×2 IMPLANT
GOWN STRL REUS W/ TWL XL LVL3 (GOWN DISPOSABLE) ×2 IMPLANT
GOWN STRL REUS W/TWL LRG LVL3 (GOWN DISPOSABLE) ×2
GOWN STRL REUS W/TWL XL LVL3 (GOWN DISPOSABLE) ×2
HANDPIECE INTERPULSE COAX TIP (DISPOSABLE) ×1
HOOD PEEL AWAY FACE SHEILD DIS (HOOD) ×4 IMPLANT
IMMOBILIZER KNEE 20 (SOFTGOODS) IMPLANT
IMMOBILIZER KNEE 22 UNIV (SOFTGOODS) ×2 IMPLANT
KIT BASIN OR (CUSTOM PROCEDURE TRAY) ×2 IMPLANT
KIT ROOM TURNOVER OR (KITS) ×2 IMPLANT
MANIFOLD NEPTUNE II (INSTRUMENTS) ×2 IMPLANT
NEEDLE SPNL 22GX3.5 QUINCKE BK (NEEDLE) ×2 IMPLANT
NS IRRIG 1000ML POUR BTL (IV SOLUTION) ×2 IMPLANT
PACK TOTAL JOINT (CUSTOM PROCEDURE TRAY) ×2 IMPLANT
PACK UNIVERSAL I (CUSTOM PROCEDURE TRAY) IMPLANT
PAD ARMBOARD 7.5X6 YLW CONV (MISCELLANEOUS) ×4 IMPLANT
PAD CAST 4YDX4 CTTN HI CHSV (CAST SUPPLIES) ×1 IMPLANT
PADDING CAST COTTON 4X4 STRL (CAST SUPPLIES) ×1
SET HNDPC FAN SPRY TIP SCT (DISPOSABLE) ×1 IMPLANT
STAPLER VISISTAT 35W (STAPLE) IMPLANT
STRIP CLOSURE SKIN 1/2X4 (GAUZE/BANDAGES/DRESSINGS) ×2 IMPLANT
SUCTION FRAZIER HANDLE 10FR (MISCELLANEOUS) ×1
SUCTION TUBE FRAZIER 10FR DISP (MISCELLANEOUS) ×1 IMPLANT
SUT MNCRL AB 3-0 PS2 18 (SUTURE) IMPLANT
SUT VIC AB 0 CTB1 27 (SUTURE) ×4 IMPLANT
SUT VIC AB 1 CT1 27 (SUTURE) ×2
SUT VIC AB 1 CT1 27XBRD ANBCTR (SUTURE) ×2 IMPLANT
SUT VIC AB 2-0 CTB1 (SUTURE) ×4 IMPLANT
SYR 50ML LL SCALE MARK (SYRINGE) ×2 IMPLANT
TOWEL OR 17X24 6PK STRL BLUE (TOWEL DISPOSABLE) ×2 IMPLANT
TOWEL OR 17X26 10 PK STRL BLUE (TOWEL DISPOSABLE) ×2 IMPLANT
TRAY FOLEY CATH 16FRSI W/METER (SET/KITS/TRAYS/PACK) IMPLANT
WRAP KNEE MAXI GEL POST OP (GAUZE/BANDAGES/DRESSINGS) ×2 IMPLANT

## 2016-02-16 NOTE — Progress Notes (Signed)
Orthopedic Tech Progress Note Patient Details:  Hector Lewis [birth year] [id number] Left Bone Foam with pt.'s nurse. Patient ID: Hector Lewis, male   DOB: 02/15/1941, 75 y.o.   MRN: [id number]   Hector Lewis 02/16/2016, 1:48 PM

## 2016-02-16 NOTE — Progress Notes (Signed)
Orthopedic Tech Progress Note Patient Details:  Hector Lewis 1941-05-05 [id number] Applied CPM to RLE.  Applied OHF with trapeze to pt.'s bed. CPM Right Knee CPM Right Knee: On Right Knee Flexion (Degrees): 90 Right Knee Extension (Degrees): 0   [name] 02/16/2016, 1:46 PM

## 2016-02-16 NOTE — Anesthesia Postprocedure Evaluation (Signed)
Anesthesia Post Note  Patient: Hector Lewis  Procedure(s) Performed: Procedure(s) (LRB): TOTAL KNEE ARTHROPLASTY (Right)  Patient location during evaluation: PACU Anesthesia Type: Spinal and Regional Level of consciousness: awake and alert Pain management: pain level controlled Vital Signs Assessment: post-procedure vital signs reviewed and stable Respiratory status: spontaneous breathing, nonlabored ventilation, respiratory function stable and patient connected to nasal cannula oxygen Cardiovascular status: blood pressure returned to baseline and stable Postop Assessment: no signs of nausea or vomiting Anesthetic complications: no    Last Vitals:  Filed Vitals:   02/16/16 0913 02/16/16 1249  BP: 164/84 114/61  Pulse: 68 57  Temp: 36.6 C 36.3 C  Resp:  18    Last Pain: There were no vitals filed for this visit.               Azaya Goedde S

## 2016-02-16 NOTE — Anesthesia Preprocedure Evaluation (Addendum)
Anesthesia Evaluation  Patient identified by MRN, date of birth, ID band Patient awake    Reviewed: Allergy & Precautions, Patient's Chart, lab work & pertinent test results  Airway Mallampati: II  TM Distance: >3 FB Neck ROM: Full    Dental no notable dental hx.    Pulmonary neg pulmonary ROS, former smoker,    Pulmonary exam normal breath sounds clear to auscultation       Cardiovascular hypertension, Normal cardiovascular exam Rhythm:Regular Rate:Normal     Neuro/Psych negative neurological ROS  negative psych ROS   GI/Hepatic Neg liver ROS, GERD  Medicated,  Endo/Other  Hyperthyroidism   Renal/GU negative Renal ROS     Musculoskeletal negative musculoskeletal ROS (+)   Abdominal   Peds  Hematology negative hematology ROS (+)   Anesthesia Other Findings   Reproductive/Obstetrics negative OB ROS                            BP Readings from Last 3 Encounters:  02/06/16 167/83  01/19/16 136/96  10/30/15 143/90   Lab Results  Component Value Date   WBC 7.8 02/06/2016   HGB 15.3 02/06/2016   HCT 44.0 02/06/2016   MCV 90.7 02/06/2016   PLT 197 02/06/2016     Chemistry      Component Value Date/Time   NA 140 02/06/2016 1724   K 4.2 02/06/2016 1724   CL 104 02/06/2016 1724   CO2 20* 02/06/2016 1724   BUN 16 02/06/2016 1724   CREATININE 0.94 02/06/2016 1724      Component Value Date/Time   CALCIUM 9.7 02/06/2016 1724   ALKPHOS 77 02/06/2016 1724   AST 38 02/06/2016 1724   ALT 34 02/06/2016 1724   BILITOT 0.8 02/06/2016 1724      Anesthesia Physical Anesthesia Plan  ASA: II  Anesthesia Plan: Spinal   Post-op Pain Management:    Induction:   Airway Management Planned: Simple Face Mask  Additional Equipment:   Intra-op Plan:   Post-operative Plan:   Informed Consent: I have reviewed the patients History and Physical, chart, labs and discussed the procedure  including the risks, benefits and alternatives for the proposed anesthesia with the patient or authorized representative who has indicated his/her understanding and acceptance.   Dental advisory given  Plan Discussed with: CRNA and Surgeon  Anesthesia Plan Comments:         Anesthesia Quick Evaluation

## 2016-02-16 NOTE — Progress Notes (Signed)
Orthopedic Tech Progress Note Patient Details:  Hector Lewis 1941/06/08 [id number]  Patient ID: Hector Lewis, male   DOB: 03-16-1941, 75 y.o.   MRN: [id number] Applied cpm 0-60  Karolee Stamps 02/16/2016, 7:45 PM

## 2016-02-16 NOTE — Discharge Instructions (Signed)

## 2016-02-16 NOTE — Progress Notes (Signed)
Utilization review completed.  

## 2016-02-16 NOTE — Op Note (Signed)
NAME:  Hector Lewis, Hector Lewis NO.:  [id number]  MEDICAL RECORD NO.:  [id number]  LOCATION:  5N16C                        FACILITY:  [facility]  PHYSICIAN:  Alta Corning, M.D.   DATE OF BIRTH:  1941-03-05  DATE OF PROCEDURE:  02/16/2016 DATE OF DISCHARGE:                              OPERATIVE REPORT   PREOPERATIVE DIAGNOSIS:  End-stage degenerative joint disease, right knee.  POSTOPERATIVE DIAGNOSIS:  End-stage degenerative joint disease, right knee.  PROCEDURE:  Right total knee replacement with a Sigma system, size 5 femur, size 5 tibia, a 10 mm bridging bearing, and a 38 mm all polyethylene patella.  SURGEON:  Alta Corning, M.D.  ASSISTANT:  Gary Fleet, PA.  ANESTHESIA:  Spinal.  BRIEF HISTORY:  Hector Lewis is a 75 year old male, with a long history of complaints of bilateral knee pain, right greater than left.  X-ray showed bone-on-bone changes, having night pain and light activity pain. After failure of all conservative care.  He was taken operating room for right total knee replacement.  DESCRIPTION OF PROCEDURE:  The patient was taken to the operating room. After adequate anesthesia was obtained with general anesthetic, the patient was placed supine on the operating table.  The right leg was prepped and draped in sterile fashion.  Following this, the leg was exsanguinated.  Blood pressure tourniquet inflated to 300 mmHg. Following this, attention was turned to the right knee, where after prep and drape and exsanguination and inflation of the tourniquet, a midline incision was made in the subcutaneous tissues all the extensor mechanism.  Medial parapatellar arthrotomy was undertaken.  Medial lateral meniscus removed retropatellar fat pad, synovium on the anterior aspect of the femur, and anterior and posterior cruciates were excised. Attention was then turned to the femur, where an intramedullary pilot hole was drilled and a 5-degree valgus  inclination with 11 mm of distal bone was resected.  Attention turned towards sizing the femur, sized to a 5.  Anterior and posterior cuts were made, chamfers and box. Attention then turned to the tibia, was cut perpendicular to its long axis and then sized to a 5.  It is drilled and keeled trials were put in place.  Attention turned to the patella, cut down to a level of 13 mm and a 38 paddle was chosen and lugs were drilled.  The trial patella was put in place.  Excellent range of motion stability were achieved with the knee at this point.  Following this, attention turned to removal of trial components and the final components were then cemented into place, size 5 tibia, size 5 femur, 10 mm bridging bearing trial was placed and a 38 mm all poly patella was placed and held with a clamp.  All excess bone cement removed and cement allowed to completely harden.  Once the case tourniquet was let down.  All bleeding was controlled with electrocautery.  The medium Hemovac drain was placed.  Following this, the arthrotomy was closed with 1 Vicryl running.  The skin with 0 and 2-0 Vicryl, and 3-0 Monocryl subcuticular.  Benzoin and Steri- Strips were applied.  Sterile compressive dressing was applied.  The patient was taken to the recovery to be  in satisfactory condition.  The estimated blood loss for procedure was 300 mL, but the final numbers can be gotten from the anesthetic record.     Alta Corning, M.D.     Corliss Skains  D:  02/16/2016  T:  02/16/2016  Job:  [id number]

## 2016-02-16 NOTE — Anesthesia Procedure Notes (Addendum)
Anesthesia Regional Block:  Femoral nerve block  Pre-Anesthetic Checklist: ,, timeout performed, Correct Patient, Correct Site, Correct Laterality, Correct Procedure, Correct Position, site marked, Risks and benefits discussed,  Surgical consent,  Pre-op evaluation,  At surgeon's request and post-op pain management  Laterality: Right  Prep: chloraprep       Needles:  Injection technique: Single-shot  Needle Type: Echogenic Needle     Needle Length: 9cm 9 cm Needle Gauge: 21 and 21 G    Additional Needles:  Procedures: ultrasound guided (picture in chart) Femoral nerve block Narrative:  Injection made incrementally with aspirations every 5 mL.  Performed by: Personally   Additional Notes: Patient tolerated the procedure well without complications   Date/Time: 02/16/2016 10:36 AM Performed by: SOLHEIM, JOSHUA SALOMAN Pre-anesthesia Checklist: Patient identified, Emergency Drugs available, Suction available, Patient being monitored and Timeout performed Patient Re-evaluated:Patient Re-evaluated prior to inductionOxygen Delivery Method: Nasal cannula Dental Injury: Teeth and Oropharynx as per pre-operative assessment     Spinal Patient location during procedure: OR Staffing Performed by: anesthesiologist  Preanesthetic Checklist Completed: patient identified, site marked, surgical consent, pre-op evaluation, timeout performed, IV checked, risks and benefits discussed and monitors and equipment checked Spinal Block Patient position: sitting Prep: Betadine Patient monitoring: heart rate, continuous pulse ox and blood pressure Injection technique: single-shot Needle Needle type: Sprotte  Needle gauge: 24 G Needle length: 9 cm Additional Notes Expiration date of kit checked and confirmed. Patient tolerated procedure well, without complications.     

## 2016-02-16 NOTE — Brief Op Note (Signed)
02/16/2016  2:30 PM  PATIENT:  Hector Lewis  75 y.o. male  PRE-OPERATIVE DIAGNOSIS:  Osteoarthritis right knee  POST-OPERATIVE DIAGNOSIS:  Osteoarthritis right knee  PROCEDURE:  Procedure(s): TOTAL KNEE ARTHROPLASTY (Right)  SURGEON:  Surgeon(s) and Role:    * Dorna Leitz, MD - Primary  PHYSICIAN ASSISTANT:   ASSISTANTS: bethune   ANESTHESIA:   spinal  EBL:  Total I/O In: 1500 [I.V.:1500] Out: 965 [Urine:800; Drains:150; Blood:15]  BLOOD ADMINISTERED:none  DRAINS: (1 med) Hemovact drain(s) in the r knee with  Suction Open   LOCAL MEDICATIONS USED:  NONE  SPECIMEN:  No Specimen  DISPOSITION OF SPECIMEN:  N/A  COUNTS:  YES  TOURNIQUET:   Total Tourniquet Time Documented: Thigh (Right) - 56 minutes Total: Thigh (Right) - 56 minutes   DICTATION: .Other Dictation: Dictation Number [id number]  PLAN OF CARE: Admit to inpatient   PATIENT DISPOSITION:  PACU - hemodynamically stable.   Delay start of Pharmacological VTE agent (>24hrs) due to surgical blood loss or risk of bleeding: no

## 2016-02-16 NOTE — Progress Notes (Signed)
Patients heart rate dropping to 38. Dr. Kalman Shan notified orders received. Patient then started to complain of cramping in his lower abdomen groin area and stated he felt wet. Bladder scanned >900 and Gaspar Skeeters PA was notified and ordered to put in foley. Once foley in patient's heart rate went up and patient stated he felt so much better. VSS patient being transported to 5N16

## 2016-02-16 NOTE — Transfer of Care (Signed)
Immediate Anesthesia Transfer of Care Note  Patient: Hector Lewis  Procedure(s) Performed: Procedure(s): TOTAL KNEE ARTHROPLASTY (Right)  Patient Location: PACU  Anesthesia Type:Spinal and MAC combined with regional for post-op pain  Level of Consciousness: awake, alert , oriented and patient cooperative  Airway & Oxygen Therapy: Patient Spontanous Breathing and Patient connected to nasal cannula oxygen  Post-op Assessment: Report given to RN and Post -op Vital signs reviewed and stable  Post vital signs: Reviewed and stable  Last Vitals:  Filed Vitals:   02/16/16 0913 02/16/16 1249  BP: 164/84 114/61  Pulse: 68 57  Temp: 36.6 C   Resp:  18    Complications: No apparent anesthesia complications

## 2016-02-17 ENCOUNTER — Encounter (HOSPITAL_COMMUNITY): Payer: Self-pay | Admitting: Orthopedic Surgery

## 2016-02-17 LAB — CBC
HCT: 37.2 % — ABNORMAL LOW (ref 39.0–52.0)
Hemoglobin: 13.3 g/dL (ref 13.0–17.0)
MCH: 32.1 pg (ref 26.0–34.0)
MCHC: 35.8 g/dL (ref 30.0–36.0)
MCV: 89.9 fL (ref 78.0–100.0)
Platelets: 192 10*3/uL (ref 150–400)
RBC: 4.14 MIL/uL — ABNORMAL LOW (ref 4.22–5.81)
RDW: 12.6 % (ref 11.5–15.5)
WBC: 13.7 10*3/uL — ABNORMAL HIGH (ref 4.0–10.5)

## 2016-02-17 LAB — BASIC METABOLIC PANEL
Anion gap: 10 (ref 5–15)
BUN: 15 mg/dL (ref 6–20)
CO2: 21 mmol/L — ABNORMAL LOW (ref 22–32)
Calcium: 8.7 mg/dL — ABNORMAL LOW (ref 8.9–10.3)
Chloride: 106 mmol/L (ref 101–111)
Creatinine, Ser: 0.9 mg/dL (ref 0.61–1.24)
GFR calc Af Amer: >60 mL/min (ref >60)
GFR calc non Af Amer: >60 mL/min (ref >60)
Glucose, Bld: 139 mg/dL — ABNORMAL HIGH (ref 65–99)
Potassium: 4.1 mmol/L (ref 3.5–5.1)
Sodium: 137 mmol/L (ref 135–145)

## 2016-02-17 NOTE — Progress Notes (Signed)
Physical Therapy Treatment Patient Details Name: Hector Lewis MRN: [id number] DOB: 05-Nov-1941 Today's Date: 2016-02-27    History of Present Illness 75 yo admitted for R TKA. PMHx: GERD, HTN    PT Comments    Pt with excellent progression able to ambulate unit and perform stairs. Pt and wife educated for stairs with RW, HEP, progression and plan. Pt encouraged to return to bed and CPM after lunch. Will continue to follow.   Follow Up Recommendations  Home health PT     Equipment Recommendations  3in1 (PT)    Recommendations for Other Services       Precautions / Restrictions Precautions Precautions: Knee Required Braces or Orthoses: Knee Immobilizer - Right Gaspar Skeeters verbally cleared need for KI) Restrictions Weight Bearing Restrictions: Yes RLE Weight Bearing: Weight bearing as tolerated    Mobility  Bed Mobility               General bed mobility comments: in chair on arrival  Transfers Overall transfer level: Needs assistance     Sit to Stand: Supervision         General transfer comment: cues for hand placement   Ambulation/Gait Ambulation/Gait assistance: Supervision Ambulation Distance (Feet): 500 Feet Assistive device: Rolling walker (2 wheeled) Gait Pattern/deviations: Step-through pattern;Decreased stride length   Gait velocity interpretation: Below normal speed for age/gender General Gait Details: cues for posture and sequence   Stairs Stairs: Yes Stairs assistance: Min assist Stair Management: Backwards;With walker Number of Stairs: 6 General stair comments: cues for sequence with wife assist to perform, handout provided  Wheelchair Mobility    Modified Rankin (Stroke Patients Only)       Balance                                    Cognition Arousal/Alertness: Awake/alert Behavior During Therapy: WFL for tasks assessed/performed Overall Cognitive Status: Within Functional Limits for tasks assessed                       Exercises Total Joint Exercises Hip ABduction/ADduction: AROM;Right;15 reps;Seated Straight Leg Raises: AROM;Right;15 reps;Seated Long Arc Quad: AROM;Seated;Right;15 reps Knee Flexion: AROM;Seated;Right;15 reps Goniometric ROM: 6-80    General Comments        Pertinent Vitals/Pain Pain Assessment: 0-10 Pain Score: 4  Pain Location: right knee Pain Intervention(s): Limited activity within patient's tolerance;Premedicated before session;Repositioned    Home Living                      Prior Function            PT Goals (current goals can now be found in the care plan section) Progress towards PT goals: Progressing toward goals    Frequency       PT Plan Current plan remains appropriate    Co-evaluation             End of Session Equipment Utilized During Treatment: Gait belt Activity Tolerance: Patient tolerated treatment well Patient left: in chair;with call bell/phone within reach;with family/visitor present     Time: 1132-1204 PT Time Calculation (min) (ACUTE ONLY): 32 min  Charges:  $Gait Training: 8-22 mins $Therapeutic Exercise: 8-22 mins                    G Codes:      Melford Aase 2016/02/27, 12:32 PM Jaquil Todt Pam Drown,  PT [phone]

## 2016-02-17 NOTE — Progress Notes (Signed)
Orthopedic Tech Progress Note Patient Details:  [name] 1941/07/27 [id number]  Patient ID: Hector Lewis, male   DOB: 1941/12/04, 75 y.o.   MRN: [id number] Pt. already in cpm   Karolee Stamps 02/17/2016, 8:21 PM

## 2016-02-17 NOTE — Progress Notes (Signed)
Subjective: 1 Day Post-Op Procedure(s) (LRB): TOTAL KNEE ARTHROPLASTY (Right) Patient reports pain as moderate.  Taking by mouth okay. Has not voided yet. Foley removed this morning. Did well with therapy so far.  Objective: Vital signs in last 24 hours: Temp:  [97.4 F (36.3 C)-98.3 F (36.8 C)] 97.4 F (36.3 C) (03/07 0545) Pulse Rate:  [39-82] 73 (03/07 0545) Resp:  [12-23] 19 (03/07 0545) BP: (114-157)/(61-88) 125/65 mmHg (03/07 0545) SpO2:  [93 %-100 %] 93 % (03/07 0545)  Intake/Output from previous day: 03/06 0701 - 03/07 0700 In: 3223.3 [I.V.:3173.3; IV Piggyback:50] Out: 2630 [id number]; Drains:690; Blood:15] Intake/Output this shift:     Recent Labs  02/17/16 0529  HGB 13.3    Recent Labs  02/17/16 0529  WBC 13.7*  RBC 4.14*  HCT 37.2*  PLT 192    Recent Labs  02/17/16 0529  NA 137  K 4.1  CL 106  CO2 21*  BUN 15  CREATININE 0.90  GLUCOSE 139*  CALCIUM 8.7*   No results for input(s): LABPT, INR in the last 72 hours. Right knee exam: Hemovac drain intact. Neurovascular intact Sensation intact distally Intact pulses distally Dorsiflexion/Plantar flexion intact Incision: dressing C/D/I No cellulitis present Compartment soft  Assessment/Plan: 1 Day Post-Op Procedure(s) (LRB): TOTAL KNEE ARTHROPLASTY (Right) Plan: Drain pulled. Aspirin 325 mg twice daily with SCDs for DVT prophylaxis. Up with therapy Plan for discharge tomorrow  Weaver Tweed G 02/17/2016, 9:46 AM

## 2016-02-17 NOTE — Evaluation (Signed)
Physical Therapy Evaluation Patient Details Name: Hector Lewis MRN: [id number] DOB: [birth year] Today's Date: 02/17/2016   History of Present Illness  75 yo admitted for R TKA. PMHx: GERD, HTN  Clinical Impression  Pt very pleasant and moving well. Pt educated for knee precautions, transfers, HEP,CPM use, bone foam (in use end of session), KI and plan. Pt with decreased strength, ROM, transfers and gait who will benefit from acute therapy to maximize mobility, function, gait and independence for return to independence.    Follow Up Recommendations Home health PT    Equipment Recommendations  None recommended by PT    Recommendations for Other Services       Precautions / Restrictions Precautions Precautions: Knee Precaution Booklet Issued: Yes (comment) Required Braces or Orthoses: Knee Immobilizer - Right Knee Immobilizer - Right: On when out of bed or walking Restrictions RLE Weight Bearing: Weight bearing as tolerated      Mobility  Bed Mobility Overal bed mobility: Modified Independent             General bed mobility comments: with rail   Transfers Overall transfer level: Needs assistance   Transfers: Sit to/from Stand Sit to Stand: Supervision         General transfer comment: cues for hand placement and sequence  Ambulation/Gait Ambulation/Gait assistance: Supervision Ambulation Distance (Feet): 200 Feet Assistive device: Rolling walker (2 wheeled) Gait Pattern/deviations: Step-through pattern;Decreased stride length   Gait velocity interpretation: Below normal speed for age/gender General Gait Details: cues for posture, position in RW and sequence  Stairs            Wheelchair Mobility    Modified Rankin (Stroke Patients Only)       Balance                                             Pertinent Vitals/Pain Pain Assessment: 0-10 Pain Score: 3  Pain Location: right knee Pain Descriptors / Indicators:  Aching Pain Intervention(s): Limited activity within patient's tolerance;Monitored during session;Premedicated before session;Repositioned    Home Living Family/patient expects to be discharged to:: Private residence Living Arrangements: Spouse/significant other Available Help at Discharge: Family;Available 24 hours/day Type of Home: House Home Access: Stairs to enter Entrance Stairs-Rails: None Entrance Stairs-Number of Steps: 3 and 3 Home Layout: One level Home Equipment: Walker - 2 wheels;Walker - 4 wheels;Cane - single point;Crutches;Shower seat - built in      Prior Function Level of Independence: Independent               Journalist, newspaper        Extremity/Trunk Assessment   Upper Extremity Assessment: Overall WFL for tasks assessed           Lower Extremity Assessment: RLE deficits/detail RLE Deficits / Details: decreased strength andROM post op    Cervical / Trunk Assessment: Normal  Communication   Communication: No difficulties  Cognition Arousal/Alertness: Awake/alert Behavior During Therapy: WFL for tasks assessed/performed Overall Cognitive Status: Within Functional Limits for tasks assessed                      General Comments      Exercises Total Joint Exercises Heel Slides: AAROM;Right;10 reps;Supine Hip ABduction/ADduction: AROM;Right;10 reps;Supine Straight Leg Raises: AROM;Right;10 reps;Supine      Assessment/Plan    PT Assessment Patient needs continued PT services  PT  Diagnosis Difficulty walking;Acute pain   PT Problem List Decreased strength;Decreased range of motion;Decreased activity tolerance;Decreased mobility;Pain;Decreased knowledge of use of DME  PT Treatment Interventions DME instruction;Gait training;Stair training;Functional mobility training;Therapeutic activities;Therapeutic exercise;Patient/family education   PT Goals (Current goals can be found in the Care Plan section) Acute Rehab PT Goals Patient Stated  Goal: return to pickleball and playing with my granddgtr PT Goal Formulation: With patient Time For Goal Achievement: 02/24/16 Potential to Achieve Goals: Good    Frequency 7X/week   Barriers to discharge        Co-evaluation               End of Session Equipment Utilized During Treatment: Gait belt;Right knee immobilizer Activity Tolerance: Patient tolerated treatment well Patient left: in chair;with call bell/phone within reach Nurse Communication: Mobility status         Time: [id number] PT Time Calculation (min) (ACUTE ONLY): 26 min   Charges:   PT Evaluation $PT Eval Moderate Complexity: 1 Procedure PT Treatments $Gait Training: 8-22 mins   PT G CodesMelford Aase 02/17/2016, 7:52 AM Elwyn Reach, [place]

## 2016-02-17 NOTE — Progress Notes (Signed)
Orthopedic Tech Progress Note Patient Details:  [name] 29-Jan-1941 [id number]  Patient ID: Hector Lewis, male   DOB: 1941/04/22, 75 y.o.   MRN: [id number] Applied cpm 0-60  Karolee Stamps 02/17/2016, 5:45 AM

## 2016-02-18 LAB — CBC
HCT: 38.2 % — ABNORMAL LOW (ref 39.0–52.0)
Hemoglobin: 13 g/dL (ref 13.0–17.0)
MCH: 30.9 pg (ref 26.0–34.0)
MCHC: 34 g/dL (ref 30.0–36.0)
MCV: 90.7 fL (ref 78.0–100.0)
Platelets: 220 10*3/uL (ref 150–400)
RBC: 4.21 MIL/uL — ABNORMAL LOW (ref 4.22–5.81)
RDW: 12.7 % (ref 11.5–15.5)
WBC: 18.3 10*3/uL — ABNORMAL HIGH (ref 4.0–10.5)

## 2016-02-18 NOTE — Progress Notes (Signed)
Orthopedic Tech Progress Note Patient Details:  Hector Lewis 01-Feb-1941 [id number]  Patient ID: [name], male   DOB: [birth year], 75 y.o.   MRN: [id number] Applied cpm 0-65  Karolee Stamps 02/18/2016, 5:29 AM

## 2016-02-18 NOTE — Progress Notes (Signed)
Subjective: 2 Days Post-Op Procedure(s) (LRB): TOTAL KNEE ARTHROPLASTY (Right) Patient reports pain as moderate.  Patient doing well. Taking by mouth and voiding okay. Good progress with physical therapy.  Objective: Vital signs in last 24 hours: Temp:  [97.1 F (36.2 C)-98.7 F (37.1 C)] 98.3 F (36.8 C) (03/08 0500) Pulse Rate:  [63-117] 65 (03/08 0500) Resp:  [18] 18 (03/08 0500) BP: (139-155)/(68-81) 139/68 mmHg (03/08 0500) SpO2:  [94 %-99 %] 94 % (03/08 0500)  Intake/Output from previous day: 03/07 0701 - 03/08 0700 In: 760 [P.O.:760] Out: 4 [Urine:4] Intake/Output this shift:     Recent Labs  02/17/16 0529 02/18/16 0401  HGB 13.3 13.0    Recent Labs  02/17/16 0529 02/18/16 0401  WBC 13.7* 18.3*  RBC 4.14* 4.21*  HCT 37.2* 38.2*  PLT 192 220    Recent Labs  02/17/16 0529  NA 137  K 4.1  CL 106  CO2 21*  BUN 15  CREATININE 0.90  GLUCOSE 139*  CALCIUM 8.7*   No results for input(s): LABPT, INR in the last 72 hours. Right knee exam: Neurovascular intact Sensation intact distally Intact pulses distally Dorsiflexion/Plantar flexion intact Incision: dressing C/D/I Compartment soft  Assessment/Plan: 2 Days Post-Op Procedure(s) (LRB): TOTAL KNEE ARTHROPLASTY (Right) Plan: Aspirin 325 mg twice daily for DVT prophylaxis. Up with therapy Discharge home with home health Weight-bear as tolerated on right. Follow-up with Dr. Berenice Primas in 2 weeks.  Zyasia Halbleib G 02/18/2016, 10:22 AM

## 2016-02-18 NOTE — Discharge Summary (Signed)
Patient ID: NASHAUN VIVERITO MRN: [id number] DOB/AGE: September 22, 1941 75 y.o.  Admit date: 02/16/2016 Discharge date: 02/18/2016  Admission Diagnoses:  Principal Problem:   Primary osteoarthritis of right knee   Discharge Diagnoses:  Same  Past Medical History  Diagnosis Date  . HYPERLIPIDEMIA 05/30/2009  . 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  . METATARSALGIA 01/27/2010  . BUNION, LEFT FOOT 09/16/2008  . 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 MEDIAL MENISCUS TEAR, RIGHT 10/17/2008  . GERD 12/29/2010  . Hypertension   . Hyperthyroidism 1980's    resolved now, took medicine at the time  . Arthritis   . 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 male who was admitted 02/16/2016 for operative treatment ofPrimary osteoarthritis of right knee. Patient has severe unremitting pain that affects sleep, daily activities, and work/hobbies. After pre-op clearance the patient was taken to the operating room on 02/16/2016 and underwent  Procedure(s): Right TOTAL KNEE ARTHROPLASTY.    Patient was given perioperative antibiotics: Anti-infectives    Start     Dose/Rate Route Frequency Ordered Stop   02/16/16 1630  ceFAZolin (ANCEF) IVPB 2 g/50 mL premix     2 g 100 mL/hr over 30 Minutes Intravenous Every 6 hours 02/16/16 1615 02/17/16 0009   02/16/16 1030  ceFAZolin (ANCEF) IVPB 2 g/50 mL premix     2 g 100 mL/hr over 30 Minutes Intravenous To ShortStay Surgical 02/13/16 1119 02/16/16 1112       Patient was given sequential compression devices, early ambulation, and chemoprophylaxis to prevent DVT. The patient progressed very well with physical therapy. On the date of discharge his vital signs are stable and he was afebrile  Patient benefited maximally from hospital stay and there were no complications.    Recent vital signs: Patient Vitals for the past 24 hrs:  BP  Temp Temp src Pulse Resp SpO2  02/18/16 0500 139/68 mmHg 98.3 F (36.8 C) Oral 65 18 94 %  02/18/16 0100 (!) 150/78 mmHg 98.5 F (36.9 C) Oral 67 18 96 %  02/17/16 1952 (!) 155/81 mmHg 98.7 F (37.1 C) Oral 66 18 96 %  02/17/16 1808 (!) 151/73 mmHg 97.1 F (36.2 C) Oral 63 18 95 %  02/17/16 1300 (!) 142/76 mmHg 98.7 F (37.1 C) Oral (!) 117 18 99 %     Recent laboratory studies:  Recent Labs  02/17/16 0529 02/18/16 0401  WBC 13.7* 18.3*  HGB 13.3 13.0  HCT 37.2* 38.2*  PLT 192 220  NA 137  --   K 4.1  --   CL 106  --   CO2 21*  --   BUN 15  --   CREATININE 0.90  --   GLUCOSE 139*  --   CALCIUM 8.7*  --      Discharge Medications:     Medication List    STOP taking these medications        aspirin 81 MG tablet  Replaced by:  aspirin EC 325 MG tablet     ibuprofen 200 MG tablet  Commonly known as:  ADVIL,MOTRIN      TAKE these medications        aspirin EC 325 MG tablet  Take 1 tablet (325 mg total) by mouth 2 (two) times daily after a meal. Take x 1 month post op to decrease risk of blood clots.  atorvastatin 20 MG tablet  Commonly known as:  LIPITOR  TAKE 1 TABLET BY MOUTH EVERY OTHER DAY     fish oil-omega-3 fatty acids 1000 MG capsule  Take 1 g by mouth 2 (two) times daily.     multivitamin capsule  Take 1 capsule by mouth daily.     oxyCODONE-acetaminophen 5-325 MG tablet  Commonly known as:  PERCOCET/ROXICET  Take 1-2 tablets by mouth every 4 (four) hours as needed for severe pain.     pantoprazole 40 MG tablet  Commonly known as:  PROTONIX  TAKE 1 TABLET BY MOUTH EVERY DAY     tiZANidine 2 MG tablet  Commonly known as:  ZANAFLEX  Take 1 tablet (2 mg total) by mouth every 8 (eight) hours as needed for muscle spasms.     ULTRAFLORA IMMUNE HEALTH PO  Take 1 capsule by mouth daily.     XIFAXAN PO  Take by mouth.     rifaximin 550 MG Tabs tablet  Commonly known as:  XIFAXAN  Take 550 mg by mouth 2 (two) times daily as needed. For  intestine infection        Diagnostic Studies: Dg Chest 2 View  02/06/2016  CLINICAL DATA:  Preoperative evaluation for upcoming knee replacement EXAM: CHEST  2 VIEW COMPARISON:  11/04/2007 FINDINGS: The heart size and mediastinal contours are within normal limits. Both lungs are clear. The visualized skeletal structures are unremarkable. IMPRESSION: No acute abnormality noted. Electronically Signed   By: Inez Catalina M.D.   On: 02/06/2016 16:43    Disposition:       Discharge Instructions    CPM    Complete by:  As directed   Continuous passive motion machine (CPM):      Use the CPM from 0 to 60 for 8 hours per day.      You may increase by 5-10 per day.  You may break it up into 2 or 3 sessions per day.      Use CPM for 1-2 weeks or until you are told to stop.     Call MD / Call 911    Complete by:  As directed   If you experience chest pain or shortness of breath, CALL 911 and be transported to the hospital emergency room.  If you develope a fever above 101 F, pus (white drainage) or increased drainage or redness at the wound, or calf pain, call your surgeon's office.     Constipation Prevention    Complete by:  As directed   Drink plenty of fluids.  Prune juice may be helpful.  You may use a stool softener, such as Colace (over the counter) 100 mg twice a day.  Use MiraLax (over the counter) for constipation as needed.     Diet general    Complete by:  As directed      Do not put a pillow under the knee. Place it under the heel.    Complete by:  As directed      Increase activity slowly as tolerated    Complete by:  As directed      Weight bearing as tolerated    Complete by:  As directed   Laterality:  right  Extremity:  Lower           Follow-up Information    Follow up with GRAVES,JOHN L, MD. Schedule an appointment as soon as possible for a visit in 2 weeks.   Specialty:  Orthopedic Surgery  Contact information:   [place]  [id number] [phone]        Signed: Erlene Senters 02/18/2016, 10:28 AM

## 2016-02-18 NOTE — Progress Notes (Signed)
Physical Therapy Treatment Patient Details Name: Hector Lewis MRN: [id number] DOB: 11-Sep-1941 Today's Date: 02/18/2016    History of Present Illness 75 yo admitted for R TKA. PMHx: GERD, HTN    PT Comments    Pt has met acute goals, d/c from PT for d/c home later today. To be followed by HHPT. Discussed car transfer and other d/c issues. Pt ambulated over 500' with RW, mod I as well as performing transfers at mod I level. PT signing off.    Follow Up Recommendations  Home health PT     Equipment Recommendations  3in1 (PT)    Recommendations for Other Services       Precautions / Restrictions Precautions Precautions: Knee Precaution Comments: reviewed proper positioning and pt able to verbalize Restrictions Weight Bearing Restrictions: Yes RLE Weight Bearing: Weight bearing as tolerated    Mobility  Bed Mobility               General bed mobility comments: in chair on arrival  Transfers Overall transfer level: Modified independent Equipment used: Rolling walker (2 wheeled) Transfers: Sit to/from Stand Sit to Stand: Modified independent (Device/Increase time)         General transfer comment: pt safe with transfers and correct hand placement noted  Ambulation/Gait Ambulation/Gait assistance: Modified independent (Device/Increase time) Ambulation Distance (Feet): 520 Feet Assistive device: Rolling walker (2 wheeled) Gait Pattern/deviations: Step-through pattern   Gait velocity interpretation: Below normal speed for age/gender General Gait Details: worked on symmettrical pattern with good heel strike and swing through. Pt safe with RW   Stairs            Wheelchair Mobility    Modified Rankin (Stroke Patients Only)       Balance Overall balance assessment: No apparent balance deficits (not formally assessed)                                  Cognition Arousal/Alertness: Awake/alert Behavior During Therapy: WFL for tasks  assessed/performed Overall Cognitive Status: Within Functional Limits for tasks assessed                      Exercises Total Joint Exercises Ankle Circles/Pumps: AROM;Both;20 reps;Seated Quad Sets: AROM;Both;15 reps;Seated Heel Slides: Right;10 reps;Seated;AROM Long [employer]: AROM;Seated;Right;15 reps Knee Flexion: AROM;Seated;Right;15 reps Goniometric ROM: 5-85 General Exercises - Lower Extremity Mini-Sqauts: 10 reps;AROM;Standing    General Comments General comments (skin integrity, edema, etc.): discussed car transfer, activity level upon return home, and answered questions re: d/c      Pertinent Vitals/Pain Pain Assessment: 0-10 Pain Score: 4  Pain Location: right knee Pain Descriptors / Indicators: Sore Pain Intervention(s): Monitored during session;Premedicated before session    Home Living                      Prior Function            PT Goals (current goals can now be found in the care plan section) Acute Rehab PT Goals Patient Stated Goal: return to pickleball and playing with my granddgtr PT Goal Formulation: With patient Time For Goal Achievement: 02/24/16 Potential to Achieve Goals: Good Progress towards PT goals: Goals met/education completed, patient discharged from PT    Frequency  7X/week    PT Plan Current plan remains appropriate    Co-evaluation             End of  Session Equipment Utilized During Treatment: Gait belt Activity Tolerance: Patient tolerated treatment well Patient left: in chair;with call bell/phone within reach     Time: 0818-0858 PT Time Calculation (min) (ACUTE ONLY): 40 min  Charges:  $Gait Training: 23-37 mins $Therapeutic Exercise: 8-22 mins                    G Codes:     Leighton Roach, PT  Acute Rehab Services  [phone]  Leighton Roach 02/18/2016, 10:55 AM

## 2016-02-23 SURGERY — ARTHROPLASTY, KNEE, TOTAL
Anesthesia: Choice | Laterality: Right

## 2016-06-14 ENCOUNTER — Ambulatory Visit (INDEPENDENT_AMBULATORY_CARE_PROVIDER_SITE_OTHER): Payer: Medicare Other | Admitting: Family Medicine

## 2016-06-14 ENCOUNTER — Encounter: Payer: Self-pay | Admitting: Family Medicine

## 2016-06-14 VITALS — BP 140/82 | HR 75 | Temp 98.3°F | Resp 16 | Ht 69.0 in | Wt 190.0 lb

## 2016-06-14 DIAGNOSIS — H8112 Benign paroxysmal vertigo, left ear: Secondary | ICD-10-CM | POA: Diagnosis not present

## 2016-06-14 NOTE — Progress Notes (Signed)
Subjective:    Patient ID: Hector Lewis, male    DOB: Nov 06, 1941, 75 y.o.   MRN: [id number]  HPI Patient seen with chief complaint of dizziness This past Saturday when he first got out of bed he noticed some vertigo He had some similar symptoms Sunday and Monday but lesser severity Earlier today he was at the [facility] was doing some exercises and when lying on his back and turning to the left side this triggered his vertigo. He denies any prior history of vertigo. No recent headaches. No speech changes. No focal weakness. No swallowing difficulties. Denies any ataxia. No visual changes. No recent nasal congestion No alleviating factors. Exacerbated as above with changing positions with head turned to the left No recent fever or chills.  Past Medical History  Diagnosis Date  . HYPERLIPIDEMIA 05/30/2009  . 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  . METATARSALGIA 01/27/2010  . BUNION, LEFT FOOT 09/16/2008  . 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 MEDIAL MENISCUS TEAR, RIGHT 10/17/2008  . GERD 12/29/2010  . Hypertension   . Hyperthyroidism 1980's    resolved now, took medicine at the time  . Arthritis   . 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 Hammer toe surgery  2012  . Colonectomy  2008  . Colon surgery  2008    precancerous polyps  . Tonsillectomy    . Appendectomy      done with colectomy  . Knee arthroscopy Right   . 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 His smoking use included Cigarettes. He has a 6 pack-year smoking history. He does not have any smokeless tobacco history on file. He reports that he drinks alcohol. He reports that he does not use illicit drugs. family history includes Cancer in his father; Hyperlipidemia in his mother. Allergies  Allergen  Reactions  . Codeine Sulfate Itching and Rash      Review of Systems  Constitutional: Negative for fever and chills.  HENT: Negative for congestion, ear discharge, ear pain and trouble swallowing.   Respiratory: Negative for shortness of breath.   Cardiovascular: Negative for chest pain.  Gastrointestinal: Negative for nausea, vomiting and abdominal pain.  Neurological: Positive for dizziness. Negative for seizures, syncope, facial asymmetry, speech difficulty, weakness, numbness and headaches.  Psychiatric/Behavioral: Negative for confusion.       Objective:   Physical Exam  Constitutional: He is oriented to person, place, and time. He appears well-developed and well-nourished.  HENT:  Right Ear: External ear normal.  Left Ear: External ear normal.  Eyes: Pupils are equal, round, and reactive to light.  Neck: Neck supple.  No carotid bruits  Cardiovascular: Normal rate and regular rhythm.   Pulmonary/Chest: Effort normal and breath sounds normal. No respiratory distress. He has no wheezes. He has no rales.  Neurological: He is alert and oriented to person, place, and time. No cranial nerve deficit. Coordination normal.  No focal weakness. Gait normal. Positive Dix-Hallpike maneuver to the left Patient has some horizontal nystagmus to the left  Psychiatric: He has a normal mood and affect. His behavior is normal.          Assessment & Plan:  Vertigo. Suspect benign peripheral positional vertigo to the left. He does not have any  red flags for more worrisome vertigo. Symptoms are reproduced consistently with head turn to the left but not to the right. We reviewed that this is frequently self-limited. We gave him handout on modified Epley maneuvers. Consider vestibular rehabilitation if symptoms not resolving over the next few days  [name] MD [facility] at [facility]

## 2016-06-14 NOTE — Patient Instructions (Signed)
Benign Positional Vertigo Vertigo is the feeling that you or your surroundings are moving when they are not. Benign positional vertigo is the most common form of vertigo. The cause of this condition is not serious (is benign). This condition is triggered by certain movements and positions (is positional). This condition can be dangerous if it occurs while you are doing something that could endanger you or others, such as driving.  CAUSES In many cases, the cause of this condition is not known. It may be caused by a disturbance in an area of the inner ear that helps your brain to sense movement and balance. This disturbance can be caused by a viral infection (labyrinthitis), head injury, or repetitive motion. RISK FACTORS This condition is more likely to develop in:  Women.  People who are 50 years of age or older. SYMPTOMS Symptoms of this condition usually happen when you move your head or your eyes in different directions. Symptoms may start suddenly, and they usually last for less than a minute. Symptoms may include:  Loss of balance and falling.  Feeling like you are spinning or moving.  Feeling like your surroundings are spinning or moving.  Nausea and vomiting.  Blurred vision.  Dizziness.  Involuntary eye movement (nystagmus). Symptoms can be mild and cause only slight annoyance, or they can be severe and interfere with daily life. Episodes of benign positional vertigo may return (recur) over time, and they may be triggered by certain movements. Symptoms may improve over time. DIAGNOSIS This condition is usually diagnosed by medical history and a physical exam of the head, neck, and ears. You may be referred to a health care provider who specializes in ear, nose, and throat (ENT) problems (otolaryngologist) or a provider who specializes in disorders of the nervous system (neurologist). You may have additional testing, including:  MRI.  A CT scan.  Eye movement tests. Your  health care provider may ask you to change positions quickly while he or she watches you for symptoms of benign positional vertigo, such as nystagmus. Eye movement may be tested with an electronystagmogram (ENG), caloric stimulation, the Dix-Hallpike test, or the roll test.  An electroencephalogram (EEG). This records electrical activity in your brain.  Hearing tests. TREATMENT Usually, your health care provider will treat this by moving your head in specific positions to adjust your inner ear back to normal. Surgery may be needed in severe cases, but this is rare. In some cases, benign positional vertigo may resolve on its own in 2-4 weeks. HOME CARE INSTRUCTIONS Safety  Move slowly.Avoid sudden body or head movements.  Avoid driving.  Avoid operating heavy machinery.  Avoid doing any tasks that would be dangerous to you or others if a vertigo episode would occur.  If you have trouble walking or keeping your balance, try using a cane for stability. If you feel dizzy or unstable, sit down right away.  Return to your normal activities as told by your health care provider. Ask your health care provider what activities are safe for you. General Instructions  Take over-the-counter and prescription medicines only as told by your health care provider.  Avoid certain positions or movements as told by your health care provider.  Drink enough fluid to keep your urine clear or pale yellow.  Keep all follow-up visits as told by your health care provider. This is important. SEEK MEDICAL CARE IF:  You have a fever.  Your condition gets worse or you develop new symptoms.  Your family or friends   notice any behavioral changes.  Your nausea or vomiting gets worse.  You have numbness or a "pins and needles" sensation. SEEK IMMEDIATE MEDICAL CARE IF:  You have difficulty speaking or moving.  You are always dizzy.  You faint.  You develop severe headaches.  You have weakness in your  legs or arms.  You have changes in your hearing or vision.  You develop a stiff neck.  You develop sensitivity to light.   This information is not intended to replace advice given to you by your health care provider. Make sure you discuss any questions you have with your health care provider.   Document Released: 09/06/2006 Document Revised: 08/20/2015 Document Reviewed: 03/24/2015 Elsevier Interactive Patient Education 2016 [employer] Inc[employer]  

## 2016-06-14 NOTE — Progress Notes (Signed)
Pre visit review using our clinic review tool, if applicable. No additional management support is needed unless otherwise documented below in the visit note. 

## 2016-06-16 ENCOUNTER — Encounter: Payer: Self-pay | Admitting: Family Medicine

## 2016-06-16 NOTE — Telephone Encounter (Signed)
Pt would like you to know that he is not any better with concerning his vertigo.  Pt would to have the therapist name.

## 2016-06-17 ENCOUNTER — Other Ambulatory Visit: Payer: Self-pay | Admitting: Family Medicine

## 2016-06-17 DIAGNOSIS — R42 Dizziness and giddiness: Secondary | ICD-10-CM

## 2016-06-22 ENCOUNTER — Other Ambulatory Visit: Payer: Self-pay | Admitting: Family Medicine

## 2016-07-04 ENCOUNTER — Other Ambulatory Visit: Payer: Self-pay | Admitting: Family Medicine

## 2016-07-29 NOTE — Progress Notes (Addendum)
Subjective:   Hector Lewis is a 75 y.o. male who presents for Medicare Annual/Subsequent preventive examination.   HRA assessment completed during this visit with 06/2016  The Patient was informed that the wellness visit is to identify future health risk and educate and initiate measures that can reduce risk for increased disease through the lifespan.    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   Diet Breakfast; cereal or health bar Egg and english muffin Try to eat nutritiously Salmon x 1 per week Red meat occasionally  Chicken Stay away from processed food;    Dental work: no issues   Exercise;   Plays pickle ball; played  Court with neck  Play wiffle ball  Also 3 times a week; takes class;  Adult active fittness and weight training Tries to get 10000. Helping dtr move   TKR in March right knee; took prednisone and spiked his blood work;  Soft tissues swelling;  Back to all his activity   HOME SAFETY Long term plan reviewed  2 levels; has 2 bedrooms and bath they do not use Every thing they need on one level Redid bathroom and kitchen;  Bars ; separate / took tub out of one bathroom  Does not climb; had device that blows leaves out of the gutter  Home: level; barriers; or needs identified as bathroom railing  or other review;  Fall hx; no falls  Given education on "Fall Prevention in the Home" for more safety tips the patient can apply as appropriate.   Personal safety issues reviewed:  1.  for risk such as safe community 2.  smoke detector;  3.  firearms safety if applicable  4. protection when in the sun; sunscreen and hat 5. driving safety for seniors or any recent accidents. No    Risk for Depression reviewed: Any emotional problems? Anxious, depressed, irritable, sad or blue? no Denies feeling depressed or hopeless; voices pleasure in daily life How many social activities have you been engaged in within the last 2 weeks? No    Cognitive; no issues  Manages checkbook, medications; no failures of task Ad8 score reviewed for issues;  Issues making decisions; no  Less interest in hobbies / activities" no  Repeats questions, stories; family complaining: NO  Trouble using ordinary gadgets; microwave; computer: no  Forgets the month or year: no  Mismanaging finances: no  Missing apt: no but does write them down  Daily problems with thinking of memory NO Ad8 score is 0  MMSE not appropriate unless AD8 score is > 2   Hearing: would like to get a hearing screen  Both his parents had issue   Vision exam vision checked/ annual exam  Concerned about glaucoma   Advanced Directive addressed; completed Dtr is a para legal  Counseling Health Maintenance Gaps: Overdue flu shot- takes at drug store but will call and report taking  Colonoscopy due 04/2018 PSV not repeated after taking when he was 58 and turning 66;   Established and updated Risk reviewed and appropriate referral made or health recommendations:  Current Care Team reviewed and updated Cardiac Risk Factors include: advanced age (>11men, >52 women);dyslipidemia;family history of premature cardiovascular disease;male gender     Objective:    Vitals: BP (!) 142/62   Pulse 74   Ht 5\' 11"  (1.803 m)   Wt 192  lb (87.1 kg)   SpO2 95%   BMI 26.78 kg/m   Body mass index is 26.78 kg/m.  Tobacco History  Smoking Status  . Former Smoker  . Packs/day: 1.00  . Years: 6.00  . Types: Cigarettes  . Quit date: 06/09/1968  Smokeless Tobacco  . Not on file    Comment: quit at 75 yo      Counseling given: Yes   Past Medical History:  Diagnosis Date  . Arthritis   . BUNION, LEFT FOOT 09/16/2008  . 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 GERD 12/29/2010  . HYPERLIPIDEMIA 05/30/2009  . Hypertension   . Hyperthyroidism 1980's   resolved now, took medicine at the time  . MEDIAL MENISCUS TEAR, RIGHT 10/17/2008  . METATARSALGIA 01/27/2010  . 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  . 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   Past Surgical History:  Procedure Laterality Date  . APPENDECTOMY     done with colectomy  . COLON SURGERY  2008   precancerous polyps  . colonectomy  2008  . [place]  2012  . KNEE ARTHROSCOPY Right   . TONSILLECTOMY    . 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   Procedure: TOTAL KNEE ARTHROPLASTY;  Surgeon: Dorna Leitz, MD;  Location: [place];  Service: Orthopedics;  Laterality: Right;   Family History  Problem Relation Age of Onset  . Cancer Father     colon, prostate  . Hyperlipidemia Mother    History  Sexual Activity  . Sexual activity: Not on file    Outpatient Encounter Prescriptions as of 07/30/2016  Medication Sig  . aspirin EC 325 MG tablet Take 1 tablet (325 mg total) by mouth 2 (two) times daily after a meal. Take x 1 month post op to decrease risk of blood clots.  Marland Kitchen atorvastatin (LIPITOR) 20 MG tablet TAKE 1 TABLET BY MOUTH EVERY OTHER DAY  . fish oil-omega-3 fatty acids 1000 MG capsule Take 1 g by mouth 2 (two) times daily.   . Multiple Vitamin (MULTIVITAMIN) capsule Take 1 capsule by mouth daily.  . pantoprazole (PROTONIX) 40 MG tablet TAKE 1 TABLET BY MOUTH EVERY DAY  . Probiotic Product (ULTRAFLORA IMMUNE HEALTH PO) Take 1 capsule by mouth daily.   .  RifAXIMin (XIFAXAN PO) Take by mouth.  . rifaximin (XIFAXAN) 550 MG TABS tablet Take 550 mg by mouth 2 (two) times daily as needed. For intestine infection  . oxyCODONE-acetaminophen (PERCOCET/ROXICET) 5-325 MG tablet Take 1-2 tablets by mouth every 4 (four) hours as needed for severe pain. (Patient not taking: Reported on 07/30/2016)  . tiZANidine (ZANAFLEX) 2 MG tablet Take 1 tablet (2 mg total) by mouth every 8 (eight) hours as needed for muscle spasms. (Patient not taking: Reported on 07/30/2016)   No facility-administered encounter medications on file as of 07/30/2016.     Activities of Daily Living In your present state of health, do you have any  difficulty performing the following activities: 07/30/2016 02/06/2016  Hearing? N N  Vision? N N  Difficulty concentrating or making decisions? N N  Walking or climbing stairs? N Y  Dressing or bathing? N N  Doing errands, shopping? N -  Preparing Food and eating ? N -  Using the Toilet? N -  In the past six months, have you accidently leaked urine? N -  Do you have problems with loss of bowel control? N -  Managing your Medications? N -  Managing your Finances? N -  Housekeeping or managing your Housekeeping? N -  Some recent data might be hidden    Patient Care Team: [name], MD as PCP - General   Assessment:      Exercise Activities and Dietary recommendations Current Exercise Habits: Home exercise routine;Structured exercise class, Type of exercise: strength training/weights;walking, Time (Minutes): > 60, Frequency (Times/Week): 5, Weekly Exercise (Minutes/Week): 0, Intensity: Moderate  Goals    . patient          Will continue to try to eat well!      Fall Risk Fall Risk  07/30/2016 01/19/2016 01/15/2015 01/15/2015  Falls in the past year? No No No No   Depression Screen PHQ 2/9 Scores 07/30/2016 01/19/2016 01/15/2015  PHQ - 2 Score 0 0 0    Cognitive Testing MMSE - Mini Mental State Exam 07/30/2016  Not completed: (No  Data)   AD8 score 0   Immunization History  Administered Date(s) Administered  . Influenza Split 09/02/2011, 08/10/2012, 10/01/2013, 09/12/2014  . Influenza Whole 09/12/2008, 09/12/2009, 09/11/2010  . Influenza, High Dose Seasonal PF 09/23/2015  . Pneumococcal Conjugate-13 01/11/2014  . Pneumococcal Polysaccharide-23 12/13/2006  . Td 12/14/1995, 03/13/2006  . Tdap 01/19/2016  . Zoster 12/13/2004   Screening Tests Health Maintenance  Topic Date Due  . INFLUENZA VACCINE  07/13/2016  . PNA vac Low Risk Adult (2 of 2 - PPSV23) 01/18/2026 (Originally 01/11/2015)  . COLONOSCOPY  05/12/2023  . TETANUS/TDAP  01/18/2026  . ZOSTAVAX  Completed      Plan:     Request hearing screen and made referral to screen   During the course of the visit the patient was educated and counseled about the following appropriate screening and preventive services:   Vaccines to include Pneumoccal, Influenza, Hepatitis B, Td, Zostavax, HCV  Electrocardiogram - no issues   Cardiovascular Disease - BP good; states it is always up some when at the MD office   Colorectal cancer screening due 2019   Diabetes screening - glucose is neg  Prostate Cancer Screening  Glaucoma screening; mild; eyes screened annually   Nutrition counseling   Smoking cessation counseling/ n/a   Patient Instructions (the written plan) was given to the patient.    Wynetta Fines, RN  07/30/2016   Agree with assessment and plan as per Wynetta Fines Above notes reviewed.  [name] MD [facility] at [facility]

## 2016-07-30 ENCOUNTER — Ambulatory Visit (INDEPENDENT_AMBULATORY_CARE_PROVIDER_SITE_OTHER): Payer: Medicare Other

## 2016-07-30 VITALS — BP 142/62 | HR 74 | Ht 71.0 in | Wt 192.0 lb

## 2016-07-30 DIAGNOSIS — Z01110 Encounter for hearing examination following failed hearing screening: Secondary | ICD-10-CM

## 2016-07-30 DIAGNOSIS — Z0000 Encounter for general adult medical examination without abnormal findings: Secondary | ICD-10-CM

## 2016-07-30 NOTE — Patient Instructions (Addendum)
[name] , Thank you for taking time to come for your Medicare Wellness Visit. I appreciate your ongoing commitment to your health goals. Please review the following plan we discussed and let me know if I can assist you in the future.   Will make an audiology referral;   Flu shot is not available today   These are the goals we discussed:/ Keep exercising and diet  Goals    None      This is a list of the screening recommended for you and due dates:  Health Maintenance  Topic Date Due  . Flu Shot  07/13/2016  . Pneumonia vaccines (2 of 2 - PPSV23) 01/18/2026*  . Colon Cancer Screening  05/12/2023  . Tetanus Vaccine  01/18/2026  . Shingles Vaccine  Completed  *Topic was postponed. The date shown is not the original due date.   Insomnia Insomnia is a sleep disorder that makes it difficult to fall asleep or to stay asleep. Insomnia can cause tiredness (fatigue), low energy, difficulty concentrating, mood swings, and poor performance at work or school.  There are three different ways to classify insomnia:  Difficulty falling asleep.  Difficulty staying asleep.  Waking up too early in the morning. Any type of insomnia can be long-term (chronic) or short-term (acute). Both are common. Short-term insomnia usually lasts for three months or less. Chronic insomnia occurs at least three times a week for longer than three months. CAUSES  Insomnia may be caused by another condition, situation, or substance, such as:  Anxiety.  Certain medicines.  Gastroesophageal reflux disease (GERD) or other gastrointestinal conditions.  Asthma or other breathing conditions.  Restless legs syndrome, sleep apnea, or other sleep disorders.  Chronic pain.  Menopause. This may include hot flashes.  Stroke.  Abuse of alcohol, tobacco, or illegal drugs.  Depression.  Caffeine.   Neurological disorders, such as Alzheimer disease.  An overactive thyroid (hyperthyroidism). The cause of  insomnia may not be known. RISK FACTORS Risk factors for insomnia include:  Gender. Women are more commonly affected than men.  Age. Insomnia is more common as you get older.  Stress. This may involve your professional or personal life.  Income. Insomnia is more common in people with lower income.  Lack of exercise.   Irregular work schedule or night shifts.  Traveling between different time zones. SIGNS AND SYMPTOMS If you have insomnia, trouble falling asleep or trouble staying asleep is the main symptom. This may lead to other symptoms, such as:  Feeling fatigued.  Feeling nervous about going to sleep.  Not feeling rested in the morning.  Having trouble concentrating.  Feeling irritable, anxious, or depressed. TREATMENT  Treatment for insomnia depends on the cause. If your insomnia is caused by an underlying condition, treatment will focus on addressing the condition. Treatment may also include:   Medicines to help you sleep.  Counseling or therapy.  Lifestyle adjustments. HOME CARE INSTRUCTIONS   Take medicines only as directed by your health care provider.  Keep regular sleeping and waking hours. Avoid naps.  Keep a sleep diary to help you and your health care provider figure out what could be causing your insomnia. Include:   When you sleep.  When you wake up during the night.  How well you sleep.   How rested you feel the next day.  Any side effects of medicines you are taking.  What you eat and drink.   Make your bedroom a comfortable place where it is easy to  fall asleep:  Put up shades or special blackout curtains to block light from outside.  Use a white noise machine to block noise.  Keep the temperature cool.   Exercise regularly as directed by your health care provider. Avoid exercising right before bedtime.  Use relaxation techniques to manage stress. Ask your health care provider to suggest some techniques that may work well for  you. These may include:  Breathing exercises.  Routines to release muscle tension.  Visualizing peaceful scenes.  Cut back on alcohol, caffeinated beverages, and cigarettes, especially close to bedtime. These can disrupt your sleep.  Do not overeat or eat spicy foods right before bedtime. This can lead to digestive discomfort that can make it hard for you to sleep.  Limit screen use before bedtime. This includes:  Watching TV.  Using your smartphone, tablet, and computer.  Stick to a routine. This can help you fall asleep faster. Try to do a quiet activity, brush your teeth, and go to bed at the same time each night.  Get out of bed if you are still awake after 15 minutes of trying to sleep. Keep the lights down, but try reading or doing a quiet activity. When you feel sleepy, go back to bed.  Make sure that you drive carefully. Avoid driving if you feel very sleepy.  Keep all follow-up appointments as directed by your health care provider. This is important. SEEK MEDICAL CARE IF:   You are tired throughout the day or have trouble in your daily routine due to sleepiness.  You continue to have sleep problems or your sleep problems get worse. SEEK IMMEDIATE MEDICAL CARE IF:   You have serious thoughts about hurting yourself or someone else.   This information is not intended to replace advice given to you by your health care provider. Make sure you discuss any questions you have with your health care provider.   Document Released: 11/26/2000 Document Revised: 08/20/2015 Document Reviewed: 08/30/2014 Elsevier Interactive Patient Education 2016 [place] in the Home  Falls can cause injuries. They can happen to people of all ages. There are many things you can do to make your home safe and to help prevent falls.  WHAT CAN I DO ON THE OUTSIDE OF MY HOME?  Regularly fix the edges of walkways and driveways and fix any cracks.  Remove anything that might  make you trip as you walk through a door, such as a raised step or threshold.  Trim any bushes or trees on the path to your home.  Use bright outdoor lighting.  Clear any walking paths of anything that might make someone trip, such as rocks or tools.  Regularly check to see if handrails are loose or broken. Make sure that both sides of any steps have handrails.  Any raised decks and porches should have guardrails on the edges.  Have any leaves, snow, or ice cleared regularly.  Use sand or salt on walking paths during winter.  Clean up any spills in your garage right away. This includes oil or grease spills. WHAT CAN I DO IN THE BATHROOM?   Use night lights.  Install grab bars by the toilet and in the tub and shower. Do not use towel bars as grab bars.  Use non-skid mats or decals in the tub or shower.  If you need to sit down in the shower, use a plastic, non-slip stool.  Keep the floor dry. Clean up any water that spills on  the floor as soon as it happens.  Remove soap buildup in the tub or shower regularly.  Attach bath mats securely with double-sided non-slip rug tape.  Do not have throw rugs and other things on the floor that can make you trip. WHAT CAN I DO IN THE BEDROOM?  Use night lights.  Make sure that you have a light by your bed that is easy to reach.  Do not use any sheets or blankets that are too big for your bed. They should not hang down onto the floor.  Have a firm chair that has side arms. You can use this for support while you get dressed.  Do not have throw rugs and other things on the floor that can make you trip. WHAT CAN I DO IN THE KITCHEN?  Clean up any spills right away.  Avoid walking on wet floors.  Keep items that you use a lot in easy-to-reach places.  If you need to reach something above you, use a strong step stool that has a grab bar.  Keep electrical cords out of the way.  Do not use floor polish or wax that makes floors  slippery. If you must use wax, use non-skid floor wax.  Do not have throw rugs and other things on the floor that can make you trip. WHAT CAN I DO WITH MY STAIRS?  Do not leave any items on the stairs.  Make sure that there are handrails on both sides of the stairs and use them. Fix handrails that are broken or loose. Make sure that handrails are as long as the stairways.  Check any carpeting to make sure that it is firmly attached to the stairs. Fix any carpet that is loose or worn.  Avoid having throw rugs at the top or bottom of the stairs. If you do have throw rugs, attach them to the floor with carpet tape.  Make sure that you have a light switch at the top of the stairs and the bottom of the stairs. If you do not have them, ask someone to add them for you. WHAT ELSE CAN I DO TO HELP PREVENT FALLS?  Wear shoes that:  Do not have high heels.  Have rubber bottoms.  Are comfortable and fit you well.  Are closed at the toe. Do not wear sandals.  If you use a stepladder:  Make sure that it is fully opened. Do not climb a closed stepladder.  Make sure that both sides of the stepladder are locked into place.  Ask someone to hold it for you, if possible.  Clearly mark and make sure that you can see:  Any grab bars or handrails.  First and last steps.  Where the edge of each step is.  Use tools that help you move around (mobility aids) if they are needed. These include:  Canes.  Walkers.  Scooters.  Crutches.  Turn on the lights when you go into a dark area. Replace any light bulbs as soon as they burn out.  Set up your furniture so you have a clear path. Avoid moving your furniture around.  If any of your floors are uneven, fix them.  If there are any pets around you, be aware of where they are.  Review your medicines with your doctor. Some medicines can make you feel dizzy. This can increase your chance of falling. Ask your doctor what other things that you  can do to help prevent falls.   This information is not  intended to replace advice given to you by your health care provider. Make sure you discuss any questions you have with your health care provider.   Document Released: 09/25/2009 Document Revised: 04/15/2015 Document Reviewed: 01/03/2015 Elsevier Interactive Patient Education 2016 [place] Maintenance, Male A healthy lifestyle and preventative care can promote health and wellness.  Maintain regular health, dental, and eye exams.  Eat a healthy diet. Foods like vegetables, fruits, whole grains, low-fat dairy products, and lean protein foods contain the nutrients you need and are low in calories. Decrease your intake of foods high in solid fats, added sugars, and salt. Get information about a proper diet from your health care provider, if necessary.  Regular physical exercise is one of the most important things you can do for your health. Most adults should get at least 150 minutes of moderate-intensity exercise (any activity that increases your heart rate and causes you to sweat) each week. In addition, most adults need muscle-strengthening exercises on 2 or more days a week.   Maintain a healthy weight. The body mass index (BMI) is a screening tool to identify possible weight problems. It provides an estimate of body fat based on height and weight. Your health care provider can find your BMI and can help you achieve or maintain a healthy weight. For males 20 years and older:  A BMI below 18.5 is considered underweight.  A BMI of 18.5 to 24.9 is normal.  A BMI of 25 to 29.9 is considered overweight.  A BMI of 30 and above is considered obese.  Maintain normal blood lipids and cholesterol by exercising and minimizing your intake of saturated fat. Eat a balanced diet with plenty of fruits and vegetables. Blood tests for lipids and cholesterol should begin at age 38 and be repeated every 5 years. If your lipid or cholesterol  levels are high, you are over age 87, or you are at high risk for heart disease, you may need your cholesterol levels checked more frequently.Ongoing high lipid and cholesterol levels should be treated with medicines if diet and exercise are not working.  If you smoke, find out from your health care provider how to quit. If you do not use tobacco, do not start.  Lung cancer screening is recommended for adults aged 47-80 years who are at high risk for developing lung cancer because of a history of smoking. A yearly low-dose CT scan of the lungs is recommended for people who have at least a 30-pack-year history of smoking and are current smokers or have quit within the past 15 years. A pack year of smoking is smoking an average of 1 pack of cigarettes a day for 1 year (for example, a 30-pack-year history of smoking could mean smoking 1 pack a day for 30 years or 2 packs a day for 15 years). Yearly screening should continue until the smoker has stopped smoking for at least 15 years. Yearly screening should be stopped for people who develop a health problem that would prevent them from having lung cancer treatment.  If you choose to drink alcohol, do not have more than 2 drinks per day. One drink is considered to be 12 oz (360 mL) of beer, 5 oz (150 mL) of wine, or 1.5 oz (45 mL) of liquor.  Avoid the use of street drugs. Do not share needles with anyone. Ask for help if you need support or instructions about stopping the use of drugs.  High blood pressure causes heart disease and  increases the risk of stroke. High blood pressure is more likely to develop in:  People who have blood pressure in the end of the normal range (100-139/85-89 mm Hg).  People who are overweight or obese.  People who are African American.  If you are 64-55 years of age, have your blood pressure checked every 3-5 years. If you are 75 years of age or older, have your blood pressure checked every year. You should have your blood  pressure measured twice--once when you are at a hospital or clinic, and once when you are not at a hospital or clinic. Record the average of the two measurements. To check your blood pressure when you are not at a hospital or clinic, you can use:  An automated blood pressure machine at a pharmacy.  A home blood pressure monitor.  If you are 63-40 years old, ask your health care provider if you should take aspirin to prevent heart disease.  Diabetes screening involves taking a blood sample to check your fasting blood sugar level. This should be done once every 3 years after age 5 if you are at a normal weight and without risk factors for diabetes. Testing should be considered at a younger age or be carried out more frequently if you are overweight and have at least 1 risk factor for diabetes.  Colorectal cancer can be detected and often prevented. Most routine colorectal cancer screening begins at the age of 81 and continues through age 73. However, your health care provider may recommend screening at an earlier age if you have risk factors for colon cancer. On a yearly basis, your health care provider may provide home test kits to check for hidden blood in the stool. A small camera at the end of a tube may be used to directly examine the colon (sigmoidoscopy or colonoscopy) to detect the earliest forms of colorectal cancer. Talk to your health care provider about this at age 11 when routine screening begins. A direct exam of the colon should be repeated every 5-10 years through age 13, unless early forms of precancerous polyps or small growths are found.  People who are at an increased risk for hepatitis B should be screened for this virus. You are considered at high risk for hepatitis B if:  You were born in a country where hepatitis B occurs often. Talk with your health care provider about which countries are considered high risk.  Your parents were born in a high-risk country and you have not  received a shot to protect against hepatitis B (hepatitis B vaccine).  You have HIV or AIDS.  You use needles to inject street drugs.  You live with, or have sex with, someone who has hepatitis B.  You are a man who has sex with other men (MSM).  You get hemodialysis treatment.  You take certain medicines for conditions like cancer, organ transplantation, and autoimmune conditions.  Hepatitis C blood testing is recommended for all people born from [birth year] through 1964 and any individual with known risk factors for hepatitis C.  Healthy men should no longer receive prostate-specific antigen (PSA) blood tests as part of routine cancer screening. Talk to your health care provider about prostate cancer screening.  Testicular cancer screening is not recommended for adolescents or adult males who have no symptoms. Screening includes self-exam, a health care provider exam, and other screening tests. Consult with your health care provider about any symptoms you have or any concerns you have about testicular cancer.  Practice safe sex. Use condoms and avoid high-risk sexual practices to reduce the spread of sexually transmitted infections (STIs).  You should be screened for STIs, including gonorrhea and chlamydia if:  You are sexually active and are younger than 24 years.  You are older than 24 years, and your health care provider tells you that you are at risk for this type of infection.  Your sexual activity has changed since you were last screened, and you are at an increased risk for chlamydia or gonorrhea. Ask your health care provider if you are at risk.  If you are at risk of being infected with HIV, it is recommended that you take a prescription medicine daily to prevent HIV infection. This is called pre-exposure prophylaxis (PrEP). You are considered at risk if:  You are a man who has sex with other men (MSM).  You are a heterosexual man who is sexually active with multiple  partners.  You take drugs by injection.  You are sexually active with a partner who has HIV.  Talk with your health care provider about whether you are at high risk of being infected with HIV. If you choose to begin PrEP, you should first be tested for HIV. You should then be tested every 3 months for as long as you are taking PrEP.  Use sunscreen. Apply sunscreen liberally and repeatedly throughout the day. You should seek shade when your shadow is shorter than you. Protect yourself by wearing long sleeves, pants, a wide-brimmed hat, and sunglasses year round whenever you are outdoors.  Tell your health care provider of new moles or changes in moles, especially if there is a change in shape or color. Also, tell your health care provider if a mole is larger than the size of a pencil eraser.  A one-time screening for abdominal aortic aneurysm (AAA) and surgical repair of large AAAs by ultrasound is recommended for men aged 82-75 years who are current or former smokers.  Stay current with your vaccines (immunizations).   This information is not intended to replace advice given to you by your health care provider. Make sure you discuss any questions you have with your health care provider.   Document Released: 05/27/2008 Document Revised: 12/20/2014 Document Reviewed: 04/26/2011 Elsevier Interactive Patient Education 2016 [place] in the Home  Falls can cause injuries. They can happen to people of all ages. There are many things you can do to make your home safe and to help prevent falls.  WHAT CAN I DO ON THE OUTSIDE OF MY HOME?  Regularly fix the edges of walkways and driveways and fix any cracks.  Remove anything that might make you trip as you walk through a door, such as a raised step or threshold.  Trim any bushes or trees on the path to your home.  Use bright outdoor lighting.  Clear any walking paths of anything that might make someone trip, such as  rocks or tools.  Regularly check to see if handrails are loose or broken. Make sure that both sides of any steps have handrails.  Any raised decks and porches should have guardrails on the edges.  Have any leaves, snow, or ice cleared regularly.  Use sand or salt on walking paths during winter.  Clean up any spills in your garage right away. This includes oil or grease spills. WHAT CAN I DO IN THE BATHROOM?   Use night lights.  Install grab bars by the toilet and  in the tub and shower. Do not use towel bars as grab bars.  Use non-skid mats or decals in the tub or shower.  If you need to sit down in the shower, use a plastic, non-slip stool.  Keep the floor dry. Clean up any water that spills on the floor as soon as it happens.  Remove soap buildup in the tub or shower regularly.  Attach bath mats securely with double-sided non-slip rug tape.  Do not have throw rugs and other things on the floor that can make you trip. WHAT CAN I DO IN THE BEDROOM?  Use night lights.  Make sure that you have a light by your bed that is easy to reach.  Do not use any sheets or blankets that are too big for your bed. They should not hang down onto the floor.  Have a firm chair that has side arms. You can use this for support while you get dressed.  Do not have throw rugs and other things on the floor that can make you trip. WHAT CAN I DO IN THE KITCHEN?  Clean up any spills right away.  Avoid walking on wet floors.  Keep items that you use a lot in easy-to-reach places.  If you need to reach something above you, use a strong step stool that has a grab bar.  Keep electrical cords out of the way.  Do not use floor polish or wax that makes floors slippery. If you must use wax, use non-skid floor wax.  Do not have throw rugs and other things on the floor that can make you trip. WHAT CAN I DO WITH MY STAIRS?  Do not leave any items on the stairs.  Make sure that there are handrails  on both sides of the stairs and use them. Fix handrails that are broken or loose. Make sure that handrails are as long as the stairways.  Check any carpeting to make sure that it is firmly attached to the stairs. Fix any carpet that is loose or worn.  Avoid having throw rugs at the top or bottom of the stairs. If you do have throw rugs, attach them to the floor with carpet tape.  Make sure that you have a light switch at the top of the stairs and the bottom of the stairs. If you do not have them, ask someone to add them for you. WHAT ELSE CAN I DO TO HELP PREVENT FALLS?  Wear shoes that:  Do not have high heels.  Have rubber bottoms.  Are comfortable and fit you well.  Are closed at the toe. Do not wear sandals.  If you use a stepladder:  Make sure that it is fully opened. Do not climb a closed stepladder.  Make sure that both sides of the stepladder are locked into place.  Ask someone to hold it for you, if possible.  Clearly mark and make sure that you can see:  Any grab bars or handrails.  First and last steps.  Where the edge of each step is.  Use tools that help you move around (mobility aids) if they are needed. These include:  Canes.  Walkers.  Scooters.  Crutches.  Turn on the lights when you go into a dark area. Replace any light bulbs as soon as they burn out.  Set up your furniture so you have a clear path. Avoid moving your furniture around.  If any of your floors are uneven, fix them.  If there are any  pets around you, be aware of where they are.  Review your medicines with your doctor. Some medicines can make you feel dizzy. This can increase your chance of falling. Ask your doctor what other things that you can do to help prevent falls.   This information is not intended to replace advice given to you by your health care provider. Make sure you discuss any questions you have with your health care provider.   Document Released: 09/25/2009  Document Revised: 04/15/2015 Document Reviewed: 01/03/2015 Elsevier Interactive Patient Education 2016 [place] Maintenance, Male A healthy lifestyle and preventative care can promote health and wellness.  Maintain regular health, dental, and eye exams.  Eat a healthy diet. Foods like vegetables, fruits, whole grains, low-fat dairy products, and lean protein foods contain the nutrients you need and are low in calories. Decrease your intake of foods high in solid fats, added sugars, and salt. Get information about a proper diet from your health care provider, if necessary.  Regular physical exercise is one of the most important things you can do for your health. Most adults should get at least 150 minutes of moderate-intensity exercise (any activity that increases your heart rate and causes you to sweat) each week. In addition, most adults need muscle-strengthening exercises on 2 or more days a week.   Maintain a healthy weight. The body mass index (BMI) is a screening tool to identify possible weight problems. It provides an estimate of body fat based on height and weight. Your health care provider can find your BMI and can help you achieve or maintain a healthy weight. For males 20 years and older:  A BMI below 18.5 is considered underweight.  A BMI of 18.5 to 24.9 is normal.  A BMI of 25 to 29.9 is considered overweight.  A BMI of 30 and above is considered obese.  Maintain normal blood lipids and cholesterol by exercising and minimizing your intake of saturated fat. Eat a balanced diet with plenty of fruits and vegetables. Blood tests for lipids and cholesterol should begin at age 55 and be repeated every 5 years. If your lipid or cholesterol levels are high, you are over age 40, or you are at high risk for heart disease, you may need your cholesterol levels checked more frequently.Ongoing high lipid and cholesterol levels should be treated with medicines if diet and exercise  are not working.  If you smoke, find out from your health care provider how to quit. If you do not use tobacco, do not start.  Lung cancer screening is recommended for adults aged 60-80 years who are at high risk for developing lung cancer because of a history of smoking. A yearly low-dose CT scan of the lungs is recommended for people who have at least a 30-pack-year history of smoking and are current smokers or have quit within the past 15 years. A pack year of smoking is smoking an average of 1 pack of cigarettes a day for 1 year (for example, a 30-pack-year history of smoking could mean smoking 1 pack a day for 30 years or 2 packs a day for 15 years). Yearly screening should continue until the smoker has stopped smoking for at least 15 years. Yearly screening should be stopped for people who develop a health problem that would prevent them from having lung cancer treatment.  If you choose to drink alcohol, do not have more than 2 drinks per day. One drink is considered to be 12 oz (360 mL)  of beer, 5 oz (150 mL) of wine, or 1.5 oz (45 mL) of liquor.  Avoid the use of street drugs. Do not share needles with anyone. Ask for help if you need support or instructions about stopping the use of drugs.  High blood pressure causes heart disease and increases the risk of stroke. High blood pressure is more likely to develop in:  People who have blood pressure in the end of the normal range (100-139/85-89 mm Hg).  People who are overweight or obese.  People who are African American.  If you are 53-68 years of age, have your blood pressure checked every 3-5 years. If you are 106 years of age or older, have your blood pressure checked every year. You should have your blood pressure measured twice--once when you are at a hospital or clinic, and once when you are not at a hospital or clinic. Record the average of the two measurements. To check your blood pressure when you are not at a hospital or clinic, you  can use:  An automated blood pressure machine at a pharmacy.  A home blood pressure monitor.  If you are 33-57 years old, ask your health care provider if you should take aspirin to prevent heart disease.  Diabetes screening involves taking a blood sample to check your fasting blood sugar level. This should be done once every 3 years after age 59 if you are at a normal weight and without risk factors for diabetes. Testing should be considered at a younger age or be carried out more frequently if you are overweight and have at least 1 risk factor for diabetes.  Colorectal cancer can be detected and often prevented. Most routine colorectal cancer screening begins at the age of 48 and continues through age 66. However, your health care provider may recommend screening at an earlier age if you have risk factors for colon cancer. On a yearly basis, your health care provider may provide home test kits to check for hidden blood in the stool. A small camera at the end of a tube may be used to directly examine the colon (sigmoidoscopy or colonoscopy) to detect the earliest forms of colorectal cancer. Talk to your health care provider about this at age 60 when routine screening begins. A direct exam of the colon should be repeated every 5-10 years through age 29, unless early forms of precancerous polyps or small growths are found.  People who are at an increased risk for hepatitis B should be screened for this virus. You are considered at high risk for hepatitis B if:  You were born in a country where hepatitis B occurs often. Talk with your health care provider about which countries are considered high risk.  Your parents were born in a high-risk country and you have not received a shot to protect against hepatitis B (hepatitis B vaccine).  You have HIV or AIDS.  You use needles to inject street drugs.  You live with, or have sex with, someone who has hepatitis B.  You are a man who has sex with  other men (MSM).  You get hemodialysis treatment.  You take certain medicines for conditions like cancer, organ transplantation, and autoimmune conditions.  Hepatitis C blood testing is recommended for all people born from [birth year] through 1965 and any individual with known risk factors for hepatitis C.  Healthy men should no longer receive prostate-specific antigen (PSA) blood tests as part of routine cancer screening. Talk to your health care provider about  prostate cancer screening.  Testicular cancer screening is not recommended for adolescents or adult males who have no symptoms. Screening includes self-exam, a health care provider exam, and other screening tests. Consult with your health care provider about any symptoms you have or any concerns you have about testicular cancer.  Practice safe sex. Use condoms and avoid high-risk sexual practices to reduce the spread of sexually transmitted infections (STIs).  You should be screened for STIs, including gonorrhea and chlamydia if:  You are sexually active and are younger than 24 years.  You are older than 24 years, and your health care provider tells you that you are at risk for this type of infection.  Your sexual activity has changed since you were last screened, and you are at an increased risk for chlamydia or gonorrhea. Ask your health care provider if you are at risk.  If you are at risk of being infected with HIV, it is recommended that you take a prescription medicine daily to prevent HIV infection. This is called pre-exposure prophylaxis (PrEP). You are considered at risk if:  You are a man who has sex with other men (MSM).  You are a heterosexual man who is sexually active with multiple partners.  You take drugs by injection.  You are sexually active with a partner who has HIV.  Talk with your health care provider about whether you are at high risk of being infected with HIV. If you choose to begin PrEP, you should first be  tested for HIV. You should then be tested every 3 months for as long as you are taking PrEP.  Use sunscreen. Apply sunscreen liberally and repeatedly throughout the day. You should seek shade when your shadow is shorter than you. Protect yourself by wearing long sleeves, pants, a wide-brimmed hat, and sunglasses year round whenever you are outdoors.  Tell your health care provider of new moles or changes in moles, especially if there is a change in shape or color. Also, tell your health care provider if a mole is larger than the size of a pencil eraser.  A one-time screening for abdominal aortic aneurysm (AAA) and surgical repair of large AAAs by ultrasound is recommended for men aged 5-75 years who are current or former smokers.  Stay current with your vaccines (immunizations).   This information is not intended to replace advice given to you by your health care provider. Make sure you discuss any questions you have with your health care provider.   Document Released: 05/27/2008 Document Revised: 12/20/2014 Document Reviewed: 04/26/2011 Elsevier Interactive Patient Education [employer].

## 2016-08-04 NOTE — Progress Notes (Signed)
Thanks. Will do

## 2016-09-26 ENCOUNTER — Other Ambulatory Visit: Payer: Self-pay | Admitting: Family Medicine

## 2016-10-29 ENCOUNTER — Encounter: Payer: Self-pay | Admitting: Family Medicine

## 2016-10-29 ENCOUNTER — Ambulatory Visit (INDEPENDENT_AMBULATORY_CARE_PROVIDER_SITE_OTHER): Payer: Medicare Other | Admitting: Family Medicine

## 2016-10-29 VITALS — BP 130/80 | HR 81 | Temp 97.8°F | Ht 71.0 in | Wt 190.5 lb

## 2016-10-29 DIAGNOSIS — R6889 Other general symptoms and signs: Secondary | ICD-10-CM | POA: Diagnosis not present

## 2016-10-29 DIAGNOSIS — R05 Cough: Secondary | ICD-10-CM

## 2016-10-29 DIAGNOSIS — R059 Cough, unspecified: Secondary | ICD-10-CM

## 2016-10-29 DIAGNOSIS — R0989 Other specified symptoms and signs involving the circulatory and respiratory systems: Secondary | ICD-10-CM

## 2016-10-29 NOTE — Patient Instructions (Signed)
Consider Nasacort for postnasal drip symptoms May also consider anti-histamine such as Claritan, Allegra, or Xyzal.

## 2016-10-29 NOTE — Progress Notes (Signed)
Subjective:     Patient ID: Hector Lewis, male   DOB: 1940/12/30, 75 y.o.   MRN: [id number]  HPI Patient seen with 6 week history of cough and frequent throat clearing. Started off with cough 6 weeks ago and now cough is almost fully resolved. He took Hall's cough drops frequently at the beginning but none recently. His cough has been nonproductive. No hemoptysis. No appetite or weight changes. Mostly complaining of postnasal drip and throat clearing at this time. He has long history of reflux takes Protonix 40 mg daily.  Past Medical History:  Diagnosis Date  . Arthritis   . BUNION, LEFT FOOT 09/16/2008  . 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 GERD 12/29/2010  . HYPERLIPIDEMIA 05/30/2009  . Hypertension   . Hyperthyroidism 1980's   resolved now, took medicine at the time  . MEDIAL MENISCUS TEAR, RIGHT 10/17/2008  . METATARSALGIA 01/27/2010  . 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  . 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   Past Surgical History:  Procedure Laterality Date  . APPENDECTOMY     done with colectomy  . COLON SURGERY  2008   precancerous polyps  . colonectomy  2008  . [place]  2012  . KNEE ARTHROSCOPY Right   . TONSILLECTOMY    . 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 His smoking use included Cigarettes. He has a 6.00 pack-year smoking history. He does not have any smokeless tobacco history on file. He reports that he drinks alcohol. He reports that he does not use drugs. family history includes Cancer in his father; Hyperlipidemia in his mother. Allergies  Allergen Reactions  . Codeine Sulfate Itching and Rash     Review of Systems  Constitutional: Negative for chills and fever.  HENT: Positive for congestion and postnasal drip.   Respiratory: Positive for cough.        Objective:   Physical Exam  Constitutional: He appears well-developed and well-nourished.  HENT:  Right Ear: External ear normal.  Left Ear: External ear normal.  Mouth/Throat: Oropharynx is clear and moist.  Neck: Neck supple.  Cardiovascular: Normal rate and regular rhythm.   Pulmonary/Chest: Effort normal and breath sounds normal. No respiratory distress. He has no wheezes. He has no rales.       Assessment:     Frequent throat clearing-question postnasal drip related. Nonfocal exam. Does have history of GERD but is on chronic PPI    Plan:     -Consider over-the-counter Claritin, Allegra, or Zyrtec -Consider over-the-counter Nasacort AQ -Avoid mentholated products -Continue Protonix  [name] MD [facility] at [facility]

## 2016-10-29 NOTE — Progress Notes (Signed)
Pre visit review using our clinic review tool, if applicable. No additional management support is needed unless otherwise documented below in the visit note. 

## 2017-01-17 ENCOUNTER — Other Ambulatory Visit (INDEPENDENT_AMBULATORY_CARE_PROVIDER_SITE_OTHER): Payer: Medicare Other

## 2017-01-17 DIAGNOSIS — Z0000 Encounter for general adult medical examination without abnormal findings: Secondary | ICD-10-CM | POA: Diagnosis not present

## 2017-01-17 LAB — LIPID PANEL
Cholesterol: 162 mg/dL (ref 0–200)
HDL: 39.4 mg/dL (ref >39.00)
LDL Cholesterol: 99 mg/dL (ref 0–99)
NonHDL: 122.44
Total CHOL/HDL Ratio: 4
Triglycerides: 118 mg/dL (ref 0.0–149.0)
VLDL: 23.6 mg/dL (ref 0.0–40.0)

## 2017-01-17 LAB — CBC WITH DIFFERENTIAL/PLATELET
Basophils Absolute: 0 10*3/uL (ref 0.0–0.1)
Basophils Relative: 0.5 % (ref 0.0–3.0)
Eosinophils Absolute: 0.2 10*3/uL (ref 0.0–0.7)
Eosinophils Relative: 4.1 % (ref 0.0–5.0)
HCT: 45.6 % (ref 39.0–52.0)
Hemoglobin: 15.7 g/dL (ref 13.0–17.0)
Lymphocytes Relative: 37.5 % (ref 12.0–46.0)
Lymphs Abs: 1.8 10*3/uL (ref 0.7–4.0)
MCHC: 34.5 g/dL (ref 30.0–36.0)
MCV: 91.3 fl (ref 78.0–100.0)
Monocytes Absolute: 0.4 10*3/uL (ref 0.1–1.0)
Monocytes Relative: 9.2 % (ref 3.0–12.0)
Neutro Abs: 2.3 10*3/uL (ref 1.4–7.7)
Neutrophils Relative %: 48.7 % (ref 43.0–77.0)
Platelets: 158 10*3/uL (ref 150.0–400.0)
RBC: 5 Mil/uL (ref 4.22–5.81)
RDW: 13.1 % (ref 11.5–15.5)
WBC: 4.7 10*3/uL (ref 4.0–10.5)

## 2017-01-17 LAB — HEPATIC FUNCTION PANEL
ALT: 25 U/L (ref 0–53)
AST: 23 U/L (ref 0–37)
Albumin: 4.3 g/dL (ref 3.5–5.2)
Alkaline Phosphatase: 76 U/L (ref 39–117)
Bilirubin, Direct: 0.1 mg/dL (ref 0.0–0.3)
Total Bilirubin: 0.7 mg/dL (ref 0.2–1.2)
Total Protein: 6.9 g/dL (ref 6.0–8.3)

## 2017-01-17 LAB — BASIC METABOLIC PANEL
BUN: 16 mg/dL (ref 6–23)
CO2: 27 mEq/L (ref 19–32)
Calcium: 9.1 mg/dL (ref 8.4–10.5)
Chloride: 106 mEq/L (ref 96–112)
Creatinine, Ser: 0.85 mg/dL (ref 0.40–1.50)
GFR: 93.44 mL/min (ref >60.00)
Glucose, Bld: 104 mg/dL — ABNORMAL HIGH (ref 70–99)
Potassium: 4.4 mEq/L (ref 3.5–5.1)
Sodium: 140 mEq/L (ref 135–145)

## 2017-01-17 LAB — TSH: TSH: 2.63 u[IU]/mL (ref 0.35–4.50)

## 2017-01-17 LAB — PSA: PSA: 0.6 ng/mL (ref 0.10–4.00)

## 2017-01-24 ENCOUNTER — Other Ambulatory Visit: Payer: Self-pay

## 2017-01-24 ENCOUNTER — Ambulatory Visit (INDEPENDENT_AMBULATORY_CARE_PROVIDER_SITE_OTHER): Payer: Medicare Other | Admitting: Family Medicine

## 2017-01-24 ENCOUNTER — Encounter: Payer: Self-pay | Admitting: Family Medicine

## 2017-01-24 VITALS — BP 130/80 | HR 72 | Ht 71.0 in | Wt 191.4 lb

## 2017-01-24 DIAGNOSIS — Z86010 Personal history of colonic polyps: Secondary | ICD-10-CM | POA: Diagnosis not present

## 2017-01-24 DIAGNOSIS — Z0000 Encounter for general adult medical examination without abnormal findings: Secondary | ICD-10-CM

## 2017-01-24 DIAGNOSIS — Z860101 Personal history of adenomatous and serrated colon polyps: Secondary | ICD-10-CM | POA: Insufficient documentation

## 2017-01-24 NOTE — Patient Instructions (Signed)
Continue with yearly flu vaccine Continue with regular weight bearing exercise.

## 2017-01-24 NOTE — Progress Notes (Signed)
Subjective:     Patient ID: Hector Lewis, male   DOB: 31-Oct-1941, 76 y.o.   MRN: [id number]  HPI Patient's seen for physical exam. He had right total knee replacement last year and has done well since then. Exercises several days per week. He has prior history of adenomatous colon polyps and is getting colonoscopy every 5 years. Health maintenance up-to-date. His medications include baby aspirin, Protonix, Lipitor. Denies any recent chest pains. Appetite and weight are stable.  His father had prostate cancer but he was age 23 before he died. He was diagnosed  with prostate cancer apparently late in life and did not die from this Past Medical History:  Diagnosis Date  . Arthritis   . BUNION, LEFT FOOT 09/16/2008  . 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 GERD 12/29/2010  . HYPERLIPIDEMIA 05/30/2009  . Hypertension   . Hyperthyroidism 1980's   resolved now, took medicine at the time  . MEDIAL MENISCUS TEAR, RIGHT 10/17/2008  . METATARSALGIA 01/27/2010  . 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  . 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   Past Surgical History:  Procedure Laterality Date  . APPENDECTOMY     done with colectomy  . COLON SURGERY  2008   precancerous polyps  . colonectomy  2008  . [place]  2012  . KNEE ARTHROSCOPY Right   . TONSILLECTOMY    . 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   Procedure: TOTAL KNEE ARTHROPLASTY;  Surgeon: Dorna Leitz, MD;  Location: [place];  Service: Orthopedics;  Laterality: Right;    reports that he quit smoking about 48 years ago. His smoking use included Cigarettes. He has a 6.00 pack-year smoking history. He has never used smokeless tobacco. He reports that he drinks alcohol. He reports that he does not use drugs. family history includes Cancer in his father; Hyperlipidemia in his mother. Allergies  Allergen Reactions  . Codeine Sulfate Itching and Rash     Review of Systems  Constitutional: Negative for activity change, appetite change, fatigue  and fever.  HENT: Negative for congestion, ear pain and trouble swallowing.   Eyes: Negative for pain and visual disturbance.  Respiratory: Negative for cough, shortness of breath and wheezing.   Cardiovascular: Negative for chest pain and palpitations.  Gastrointestinal: Negative for abdominal distention, abdominal pain, blood in stool, constipation, diarrhea, nausea, rectal pain and vomiting.  Genitourinary: Negative for dysuria, hematuria and testicular pain.  Musculoskeletal: Negative for arthralgias and joint swelling.  Skin: Negative for rash.  Neurological: Negative for dizziness, syncope and headaches.  Hematological: Negative for adenopathy.  Psychiatric/Behavioral: Negative for confusion and dysphoric mood.       Objective:   Physical Exam  Constitutional: He is oriented to person, place, and time. He appears well-developed and well-nourished. No distress.  HENT:  Head: Normocephalic and atraumatic.  Right Ear: External ear normal.  Left Ear: External ear normal.  Mouth/Throat: Oropharynx is clear and moist.  Eyes: Conjunctivae and EOM are normal. Pupils are equal, round, and reactive to light.  Neck: Normal range of motion. Neck supple. No thyromegaly present.  Cardiovascular: Normal rate, regular rhythm and normal heart sounds.   No murmur heard. Pulmonary/Chest: No respiratory distress. He has no wheezes. He has no rales.  Abdominal: Soft. Bowel sounds are normal. He exhibits no distension and no mass. There is no tenderness. There is no rebound and no guarding.  Musculoskeletal: He exhibits no edema.  Lymphadenopathy:    He has no cervical adenopathy.  Neurological: He is alert and oriented to person, place, and time. He displays normal reflexes. No cranial nerve deficit.  Skin: No rash noted.  Psychiatric: He has a normal mood and affect.       Assessment:     Physical exam. Labs reviewed with no major concerns.  Immunizations up-to-date. He will be due for  repeat colonoscopy next year    Plan:     -Continue regular exercise habits -We discussed pros and cons of ongoing PSA testing and he will decide next year  [name] MD [facility] at [facility]

## 2017-01-24 NOTE — Progress Notes (Signed)
Pre visit review using our clinic review tool, if applicable. No additional management support is needed unless otherwise documented below in the visit note. 

## 2017-03-21 ENCOUNTER — Other Ambulatory Visit: Payer: Self-pay | Admitting: Family Medicine

## 2017-06-07 ENCOUNTER — Ambulatory Visit: Payer: Medicare Other | Admitting: Family Medicine

## 2017-06-07 ENCOUNTER — Ambulatory Visit (INDEPENDENT_AMBULATORY_CARE_PROVIDER_SITE_OTHER): Payer: Medicare Other | Admitting: Family Medicine

## 2017-06-07 ENCOUNTER — Encounter: Payer: Self-pay | Admitting: Family Medicine

## 2017-06-07 VITALS — BP 142/82 | HR 69 | Temp 98.1°F | Wt 195.0 lb

## 2017-06-07 DIAGNOSIS — H8112 Benign paroxysmal vertigo, left ear: Secondary | ICD-10-CM | POA: Diagnosis not present

## 2017-06-07 NOTE — Patient Instructions (Signed)

## 2017-06-07 NOTE — Progress Notes (Signed)
Subjective:     Patient ID: Hector Lewis, male   DOB: September 09, 1941, 76 y.o.   MRN: [id number]  HPI Patient seen with intermittent vertigo over the past 2 weeks. He had very similar episode about exactly year ago. We had referred him for vestibular rehabilitation but symptoms had cleared by time he will went. He actually never followed through with follow-up for that. Current symptoms were noted 2 weeks ago and consistently when turning his head to the left side. He's had no nausea or vomiting. No hearing changes. No visual changes. No slurred speech. No focal weakness. No ataxia. He tried home Epley maneuvers which did not help much this time around but they seem to work very well last year  Past Medical History:  Diagnosis Date  . Arthritis   . BUNION, LEFT FOOT 09/16/2008  . 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 GERD 12/29/2010  . HYPERLIPIDEMIA 05/30/2009  . Hypertension   . Hyperthyroidism 1980's   resolved now, took medicine at the time  . MEDIAL MENISCUS TEAR, RIGHT 10/17/2008  . METATARSALGIA 01/27/2010  . 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  . 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   Past Surgical History:  Procedure Laterality Date  . APPENDECTOMY     done with colectomy  . COLON SURGERY  2008   precancerous polyps  . colonectomy  2008  . [place]  2012  . KNEE ARTHROSCOPY Right   . TONSILLECTOMY    . 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   Procedure: TOTAL KNEE ARTHROPLASTY;  Surgeon: Dorna Leitz, MD;  Location: [place];  Service: Orthopedics;  Laterality: Right;    reports that he quit smoking about 49 years ago. His smoking use included Cigarettes. He has a 6.00 pack-year smoking history. He has never used smokeless tobacco. He reports that he drinks alcohol. He reports that he does not use drugs. family history includes Cancer in his father; Hyperlipidemia in his mother. Allergies  Allergen Reactions  . Codeine Sulfate Itching and Rash     Review of Systems   Constitutional: Negative for chills and fever.  Cardiovascular: Negative for chest pain.  Neurological: Positive for dizziness. Negative for seizures, syncope, facial asymmetry, weakness, light-headedness, numbness and headaches.       Objective:   Physical Exam  Constitutional: He is oriented to person, place, and time. He appears well-developed and well-nourished.  Cardiovascular: Normal rate and regular rhythm.   Pulmonary/Chest: Effort normal and breath sounds normal. No respiratory distress. He has no wheezes. He has no rales.  Neurological: He is alert and oriented to person, place, and time. No cranial nerve deficit. Coordination normal.  Gait is normal. No focal weakness. Normal finger to nose testing.       Assessment:     Benign peripheral positional vertigo to the left    Plan:     -Set up vestibular rehabilitation given duration of symptoms -Follow-up promptly for any worsening of symptoms or any change of symptoms  [name] MD [facility] at [facility]

## 2017-06-10 ENCOUNTER — Encounter: Payer: Self-pay | Admitting: Family Medicine

## 2017-06-19 ENCOUNTER — Other Ambulatory Visit: Payer: Self-pay | Admitting: Family Medicine

## 2017-06-20 ENCOUNTER — Ambulatory Visit: Payer: Medicare Other | Attending: Family Medicine | Admitting: Rehabilitative and Restorative Service Providers"

## 2017-06-20 DIAGNOSIS — H8112 Benign paroxysmal vertigo, left ear: Secondary | ICD-10-CM | POA: Insufficient documentation

## 2017-06-20 DIAGNOSIS — R42 Dizziness and giddiness: Secondary | ICD-10-CM | POA: Insufficient documentation

## 2017-06-20 NOTE — Patient Instructions (Signed)
Gaze Stabilization - Tip Card  1.Target must remain in focus, not blurry, and appear stationary while head is in motion. 2.Perform exercises with small head movements (45 to either side of midline). 3.Increase speed of head motion so long as target is in focus. 4.If you wear eyeglasses, be sure you can see target through lens (therapist will give specific instructions for bifocal / progressive lenses). 5.These exercises may provoke dizziness or nausea. Work through these symptoms. If too dizzy, slow head movement slightly. Rest between each exercise. 6.Exercises demand concentration; avoid distractions. 7.For safety, perform standing exercises close to a counter, wall, corner, or next to someone.  Copyright  VHI. All rights reserved.   Gaze Stabilization - Standing Feet Apart   Feet shoulder width apart, keeping eyes on target on wall 3 feet away, tilt head down slightly and move head side to side for 30 seconds. Repeat while moving head up and down for 30 seconds. *Work up to tolerating 60 seconds, as able. Do 2-3 sessions per day.   Copyright  VHI. All rights reserved.   Feet Together (Compliant Surface) Varied Arm Positions - Eyes Closed    Stand on compliant surface: __pillow___ with feet together and arms out. Close eyes and visualize upright position. Hold__30__ seconds. Repeat _3___ times per session. Do __2__ sessions per day.  Copyright  VHI. All rights reserved.

## 2017-06-20 NOTE — Therapy (Signed)
[facility] [address] [place], [place], [address] Phone: [phone]   Fax:  [phone]  Physical Therapy Evaluation  Patient Details  Name: ROCIO WOLAK MRN: [id number] Date of Birth: 1941-03-18 Referring Provider: Carolann Littler, MD  Encounter Date: 06/20/2017      PT End of Session - 06/20/17 1009    Visit Number 1   Authorization Type G code every 10th visit   PT Start Time 0930   PT Stop Time 1010   PT Time Calculation (min) 40 min   Activity Tolerance Patient tolerated treatment well   Behavior During Therapy [facility] for tasks assessed/performed      Past Medical History:  Diagnosis Date  . Arthritis   . BUNION, LEFT FOOT 09/16/2008  . 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 GERD 12/29/2010  . HYPERLIPIDEMIA 05/30/2009  . Hypertension   . Hyperthyroidism 1980's   resolved now, took medicine at the time  . MEDIAL MENISCUS TEAR, RIGHT 10/17/2008  . METATARSALGIA 01/27/2010  . 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  . 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    Past Surgical History:  Procedure Laterality Date  . APPENDECTOMY     done with colectomy  . COLON SURGERY  2008   precancerous polyps  . colonectomy  2008  . [place]  2012  . KNEE ARTHROSCOPY Right   . TONSILLECTOMY    . 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       Subjective Assessment - 06/20/17 0930    Subjective The patient reports onset of positional vertigo last year (around the same time) treated at home with Epley's successfully.  He notes this year is returned when using eye drops (leaning back in recliner).  Symptoms are worse with rolling to the left, looking up.  "It has all eased off now".  He notes when it is bad, he feels that he pulls side to side wiht walking.     He notes "the slightest bit of pressure" in his left ear.     Pertinent History HTN   Patient Stated Goals Resolve dizziness.    Currently in Pain? No/denies            [facility] PT Assessment - 06/20/17 0936      Assessment   Medical Diagnosis Vertigo   Referring Provider Carolann Littler, MD   Onset Date/Surgical Date 05/21/17   Prior Therapy none     Precautions   Precautions None     Restrictions   Weight Bearing Restrictions No     Balance Screen   Has the patient fallen in the past 6 months No   Has the patient had a decrease in activity level because of a fear of falling?  No   Is the patient reluctant to leave their home because of a fear of falling?  No     Home Ec[employer] residence     Prior Function   Level of Independence Independent   Leisure works out 3 days/week.     Observation/Other Assessments   Focus on Therapeutic Outcomes (FOTO)  n/a- one time evaluation            Vestibular Assessment - 06/20/17 0937      Vestibular Assessment   General Observation  Walks independently into clinic.  Had to hold onto wherever I was.      Symptom Behavior   Type of Dizziness Spinning   Frequency of Dizziness daily   Duration of Dizziness seconds   Aggravating Factors Turning head quickly;Activity in general;Looking up to the ceiling;Rolling to left   Relieving Factors Head stationary     Occulomotor Exam   Occulomotor Alignment Normal   Spontaneous Absent   Gaze-induced Absent   Smooth Pursuits Intact     Vestibulo-Occular Reflex   VOR 1 Head Only (x 1 viewing) able to maintain gaze with minimal report of dizziness   Comment Head impulse test=positive head impulse test to the right for refixation saccade.     Visual Acuity   Static line 7   Dynamic line 2  5 line difference in static vs dynamic visual acuity     Positional Testing   Dix-Hallpike Dix-Hallpike Right;Dix-Hallpike Left   Sidelying Test Sidelying  Right;Sidelying Left   Horizontal Canal Testing Horizontal Canal Right;Horizontal Canal Left     Dix-Hallpike Right   Dix-Hallpike Right Duration none   Dix-Hallpike Right Symptoms No nystagmus     Dix-Hallpike Left   Dix-Hallpike Left Duration none   Dix-Hallpike Left Symptoms No nystagmus     Sidelying Right   Sidelying Right Duration none   Sidelying Right Symptoms No nystagmus     Sidelying Left   Sidelying Left Duration none   Sidelying Left Symptoms No nystagmus     Horizontal Canal Right   Horizontal Canal Right Duration none   Horizontal Canal Right Symptoms Normal     Horizontal Canal Left   Horizontal Canal Left Duration none   Horizontal Canal Left Symptoms Normal        Objective measurements completed on examination: See above findings.          [facility] Adult PT Treatment/Exercise - 06/20/17 1357      Neuro Re-ed    Neuro Re-ed Details  Standing corner exercise on foam with eyes closed, as patient demonstrated increased sway with multisensory balance challenge.         Vestibular Treatment/Exercise - 06/20/17 1357      Vestibular Treatment/Exercise   Vestibular Treatment Provided Gaze   Gaze Exercises X1 Viewing Horizontal     X1 Viewing Horizontal   Foot Position standing feet apart   Comments 30 seconds horizontal plane with cues on fixation and speed of movement.               PT Education - 06/20/17 1008    Education provided Yes   Education Details HEP: gaze adaptation, corner standing with eyes closed on foam, provided handout on brandt daroff   Person(s) Educated Patient   Methods Explanation;Demonstration;Handout   Comprehension Verbalized understanding;Returned demonstration                     Plan - 06/20/17 1016    Clinical Impression Statement The patient is a 76 year old male presenting to PT with recurring BPPV.  He has been treating at home and appears to have resolved L BPPV using Epley's maneuver.  He  did present with mild dizziness with gaze activities and + head impulse testing indicating dec'd use of VOR. He also has increased sway with eyes closed on compliant surfaces--PT provided HEP to address deficits.  No further therapy indicated at this time.   Clinical Presentation Stable   Clinical Decision Making Low   Rehab Potential Good  PT Frequency --  evaluation only   PT Treatment/Interventions Neuromuscular re-education   PT Next Visit Plan No further treatment indicated at this time.   Consulted and Agree with Plan of Care Patient      Patient will benefit from skilled therapeutic intervention in order to improve the following deficits and impairments:  Dizziness, Decreased balance  Visit Diagnosis: BPPV (benign paroxysmal positional vertigo), left  Dizziness and giddiness     Problem List Patient Active Problem List   Diagnosis Date Noted  . History of adenomatous polyp of colon 01/24/2017  . Primary osteoarthritis of right knee 02/16/2016  . Right knee pain 08/03/2013  . GERD 12/29/2010  . 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  . METATARSALGIA 01/27/2010  . VIRAL INFECTION 01/23/2010  . HYPERLIPIDEMIA 05/30/2009  . MEDIAL MENISCUS TEAR, RIGHT 10/17/2008  . KNEE PAIN, RIGHT 09/16/2008  . BUNION, LEFT FOOT 09/16/2008  . 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    Judith Campillo, PT 06/20/2017, 1:58 PM  [facility] [address] [place], [place], [address] Phone: [phone]   Fax:  [phone]  Name: ZYMERE PATLAN MRN: [id number] Date of Birth: 16-Mar-1941

## 2017-06-28 ENCOUNTER — Ambulatory Visit: Payer: Medicare Other | Admitting: Physical Therapy

## 2017-07-09 IMAGING — CR DG CHEST 2V
2 series · 2 of 2 positions shown · non-contrast
Comparison: 11/04/2007

CLINICAL DATA: Preoperative evaluation for upcoming knee
replacement

EXAM:
CHEST  2 VIEW

[w chest pa]
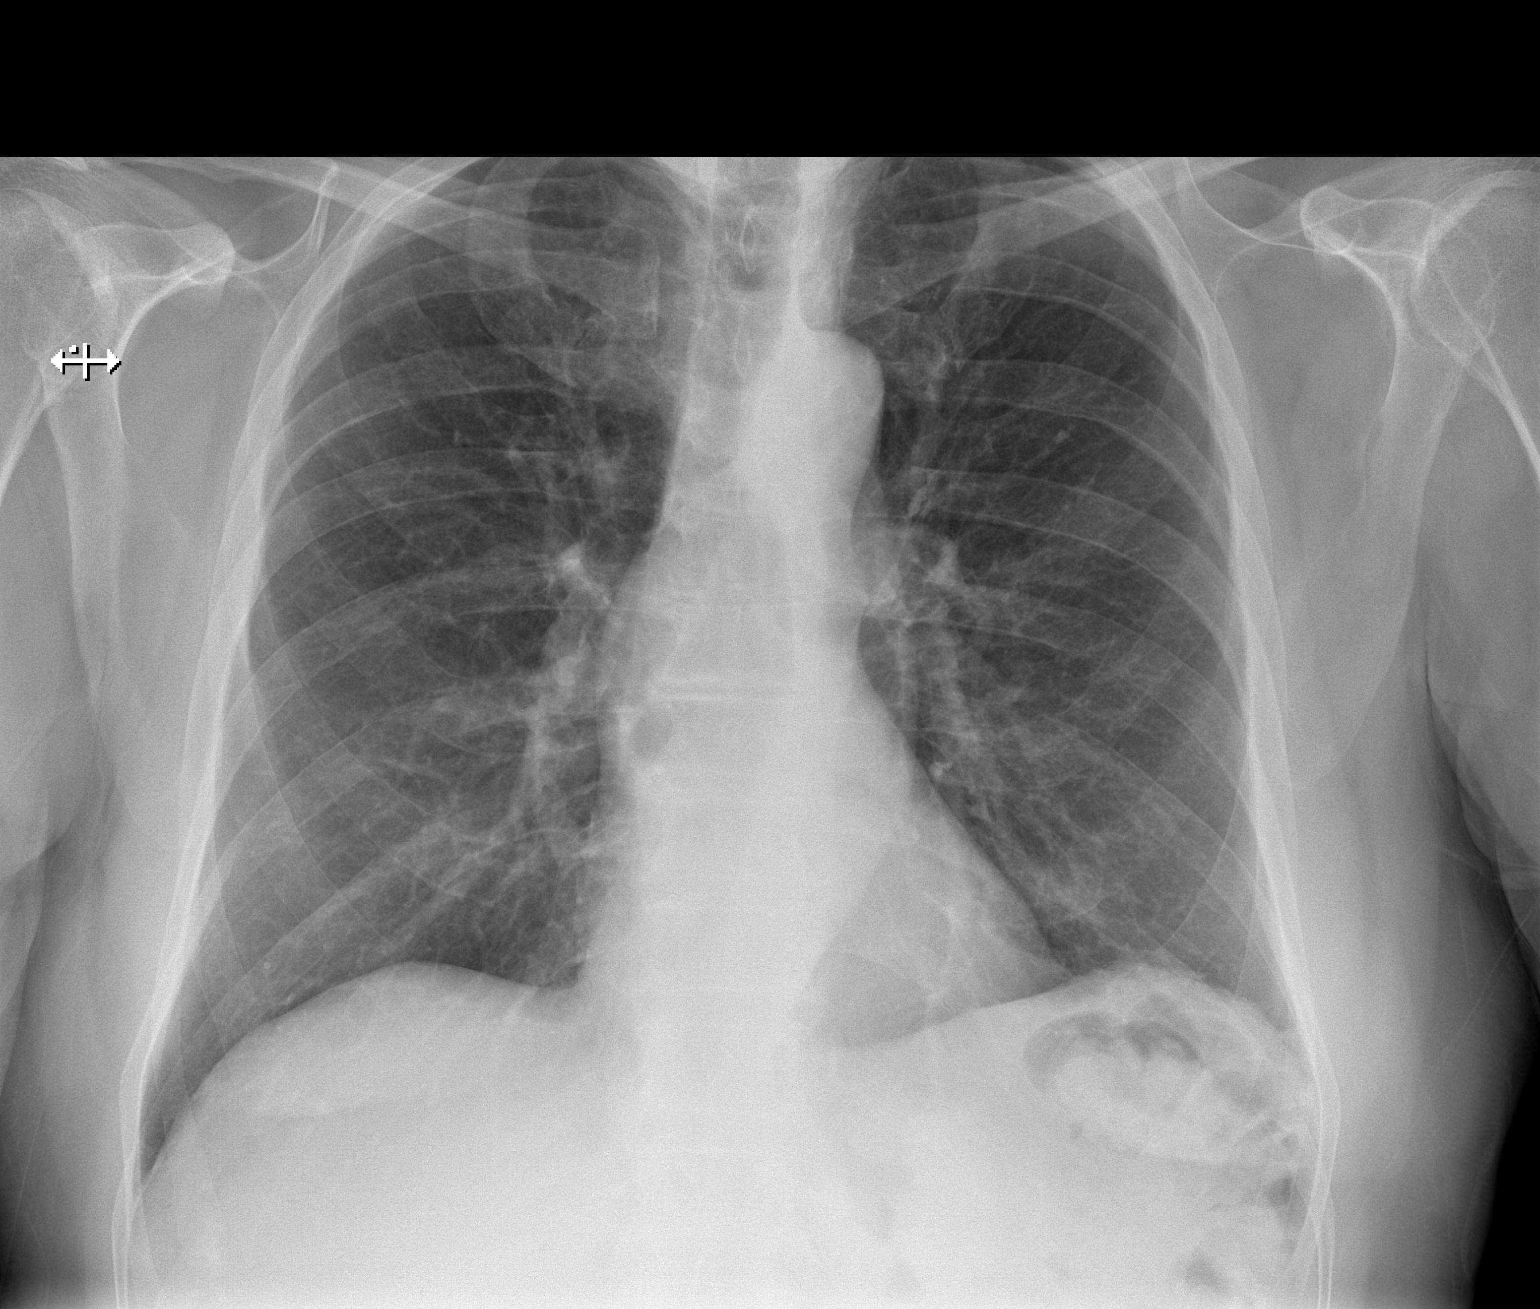

[w chest lat]
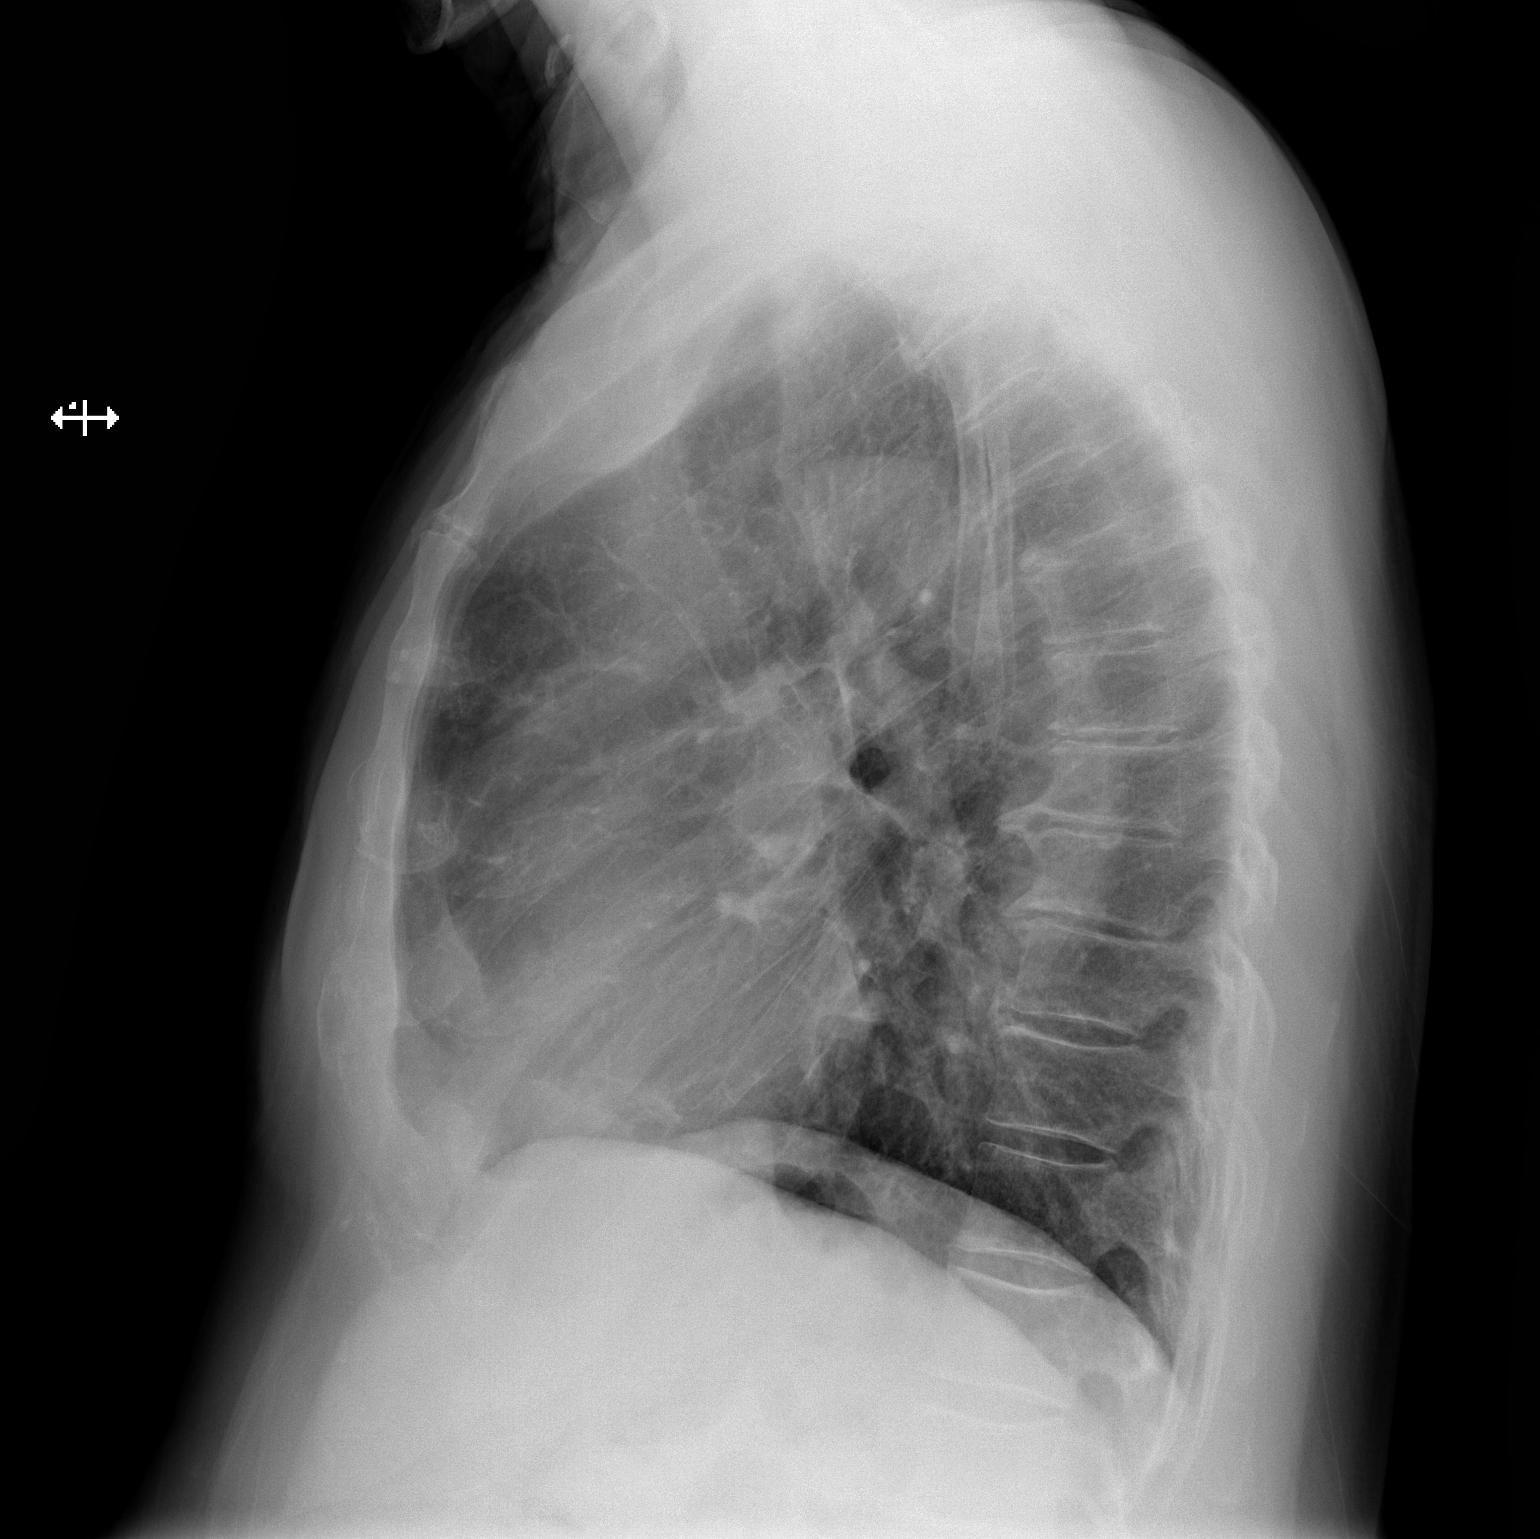

[2 of 2 positions shown; findings below may reference images not displayed]

FINDINGS: The heart size and mediastinal contours are within normal limits.
Both lungs are clear. The visualized skeletal structures are
unremarkable.
IMPRESSION: No acute abnormality noted.

## 2017-09-01 ENCOUNTER — Encounter: Payer: Self-pay | Admitting: Family Medicine

## 2017-09-22 NOTE — Progress Notes (Deleted)
Subjective:   Hector Lewis is a 76 y.o. male who presents for Medicare Annual/Subsequent preventive examination.  The Patient was informed that the wellness visit is to identify future health risk and educate and initiate measures that can reduce risk for increased disease through the lifespan.    Annual Wellness Assessment  Reports health as   Preventive Screening -Counseling & Management  Medicare Annual Preventive Care Visit - Subsequent Last OV 05/2017 CPE 01/2017  PSA 01/2017 Colonoscopy 12/2007  Health Maintenance Due  Topic Date Due  . INFLUENZA VACCINE  07/13/2017     Describes Health as poor, fair, good or great?   VS reviewed;   Diet   BMI  Exercise  Dental  Stressors:   Sleep patterns:   Pain?    Cardiac Risk Factors Addressed Hyperlipidemia - chol 162; HDL 39;  Trig 118  Diabetes Pre-diabetes A1c Obesity  Advanced Directives  Patient Care Team: [name], MD as PCP - General               Objective:    Vitals: There were no vitals taken for this visit.  There is no height or weight on file to calculate BMI.  Tobacco History  Smoking Status  . Former Smoker  . Packs/day: 1.00  . Years: 6.00  . Types: Cigarettes  . Quit date: 06/09/1968  Smokeless Tobacco  . Never Used    Comment: quit at 76 yo      Counseling given: Not Answered   Past Medical History:  Diagnosis Date  . Arthritis   . BUNION, LEFT FOOT 09/16/2008  . 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 GERD 12/29/2010  . HYPERLIPIDEMIA 05/30/2009  . Hypertension   . Hyperthyroidism 1980's   resolved now, took medicine at the time  . MEDIAL MENISCUS TEAR, RIGHT 10/17/2008  . METATARSALGIA 01/27/2010  . 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  . 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   Past Surgical History:  Procedure Laterality Date  . APPENDECTOMY     done with colectomy  . COLON SURGERY  2008   precancerous polyps  . colonectomy  2008  . [place]   2012  . KNEE ARTHROSCOPY Right   . TONSILLECTOMY    . 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 Cancer Father        colon, prostate  . Hyperlipidemia Mother    History  Sexual Activity  . Sexual activity: Not on file    Outpatient Encounter Prescriptions as of 09/23/2017  Medication Sig  . aspirin EC 81 MG tablet Take 81 mg by mouth daily.  Marland Kitchen atorvastatin (LIPITOR) 20 MG tablet TAKE 1 TABLET BY MOUTH EVERY OTHER DAY  . fish oil-omega-3 fatty acids 1000 MG capsule Take 1 g by mouth 2 (two) times daily.   . meloxicam (MOBIC) 15 MG tablet Take 15 mg by mouth every other day.  . Multiple Vitamin (MULTIVITAMIN) capsule Take 1 capsule by mouth daily.  . pantoprazole (PROTONIX) 40 MG tablet TAKE 1 TABLET BY MOUTH EVERY DAY  . Probiotic Product (ULTRAFLORA IMMUNE HEALTH PO) Take 1 capsule by mouth daily.    No facility-administered encounter medications on file as of 09/23/2017.     Activities of Daily Living No flowsheet data found.  Patient Care Team: [place],  Alinda Sierras, MD as PCP - General   Assessment:    *** Exercise Activities and Dietary recommendations    Goals    . patient          Will continue to try to eat well!      Fall Risk Fall Risk  07/30/2016 01/19/2016 01/15/2015 01/15/2015  Falls in the past year? No No No No   Depression Screen PHQ 2/9 Scores 07/30/2016 01/19/2016 01/15/2015  PHQ - 2 Score 0 0 0    Cognitive Function MMSE - Mini Mental State Exam 07/30/2016  Not completed: (No Data)        Immunization History  Administered Date(s) Administered  . Influenza Split 09/02/2011, 10/01/2012, 10/01/2013, 09/12/2014  . Influenza Whole 09/12/2008, 09/12/2009, 09/11/2010  . Influenza, High Dose Seasonal PF 09/23/2015  . Influenza-Unspecified 09/28/2016  . Pneumococcal Conjugate-13  01/11/2014  . Pneumococcal Polysaccharide-23 12/13/2006  . Td 12/14/1995, 03/13/2006  . Tdap 01/19/2016  . Zoster 12/13/2004  . Zoster Recombinat (Shingrix) 04/22/2017   Screening Tests Health Maintenance  Topic Date Due  . INFLUENZA VACCINE  07/13/2017  . PNA vac Low Risk Adult (2 of 2 - PPSV23) 01/18/2026 (Originally 01/11/2015)  . COLONOSCOPY  05/12/2023  . TETANUS/TDAP  01/18/2026      Plan:   ***  I have personally reviewed and noted the following in the patient's chart:   . Medical and social history . Use of alcohol, tobacco or illicit drugs  . Current medications and supplements . Functional ability and status . Nutritional status . Physical activity . Advanced directives . List of other physicians . Hospitalizations, surgeries, and ER visits in previous 12 months . Vitals . Screenings to include cognitive, depression, and falls . Referrals and appointments  In addition, I have reviewed and discussed with patient certain preventive protocols, quality metrics, and best practice recommendations. A written personalized care plan for preventive services as well as general preventive health recommendations were provided to patient.     Wynetta Fines, RN  09/22/2017

## 2017-09-23 ENCOUNTER — Ambulatory Visit (INDEPENDENT_AMBULATORY_CARE_PROVIDER_SITE_OTHER): Payer: Medicare Other

## 2017-09-23 ENCOUNTER — Ambulatory Visit: Payer: Medicare Other

## 2017-09-23 VITALS — BP 146/70 | HR 85 | Ht 70.0 in | Wt 197.0 lb

## 2017-09-23 DIAGNOSIS — Z0000 Encounter for general adult medical examination without abnormal findings: Secondary | ICD-10-CM | POA: Diagnosis not present

## 2017-09-23 NOTE — Patient Instructions (Addendum)
Hector Lewis , Thank you for taking time to come for your Medicare Wellness Visit. I appreciate your ongoing commitment to your health goals. Please review the following plan we discussed and let me know if I can assist you in the future.   Will discuss with Dr. Elease Hashimoto doing an Abd Aortic Aneurysm check due to remote smoking history   These are the goals we discussed: Goals    . patient          Will continue to try to eat well!    . patient          2 drinks per night w food is the limit            This is a list of the screening recommended for you and due dates:  Health Maintenance  Topic Date Due  . Pneumonia vaccines (2 of 2 - PPSV23) 01/18/2026*  . Colon Cancer Screening  05/11/2018  . Tetanus Vaccine  01/18/2026  . Flu Shot  Completed  *Topic was postponed. The date shown is not the original due date.    Prevention of falls: Remove rugs or any tripping hazards in the home Use Non slip mats in bathtubs and showers Placing grab bars next to the toilet and or shower Placing handrails on both sides of the stair way Adding extra lighting in the home.   Personal safety issues reviewed:  1. Consider starting a community watch program per [facility] 2.  Changes batteries is smoke detector and/or carbon monoxide detector  3.  If you have firearms; keep them in a safe place 4.  Wear protection when in the sun; Always wear sunscreen or a hat; It is good to have your doctor check your skin annually or review any new areas of concern 5. Driving safety; Keep in the right lane; stay 3 car lengths behind the car in front of you on the highway; look 3 times prior to pulling out; carry your cell phone everywhere you go!    Learn about the Yellow Dot program:  The program allows first responders at your emergency to have access to who your physician is, as well as your medications and medical conditions.  Citizens requesting the Yellow Dot Packages should contact  Master Corporal Nunzio Cobbs at the [facility] [phone] for the first week of the program and beginning the week after Easter citizens should contact their Scientist, physiological.       Fall Prevention in the Home Falls can cause injuries. They can happen to people of all ages. There are many things you can do to make your home safe and to help prevent falls. What can I do on the outside of my home?  Regularly fix the edges of walkways and driveways and fix any cracks.  Remove anything that might make you trip as you walk through a door, such as a raised step or threshold.  Trim any bushes or trees on the path to your home.  Use bright outdoor lighting.  Clear any walking paths of anything that might make someone trip, such as rocks or tools.  Regularly check to see if handrails are loose or broken. Make sure that both sides of any steps have handrails.  Any raised decks and porches should have guardrails on the edges.  Have any leaves, snow, or ice cleared regularly.  Use sand or salt on walking paths during winter.  Clean up any spills in your garage right  away. This includes oil or grease spills. What can I do in the bathroom?  Use night lights.  Install grab bars by the toilet and in the tub and shower. Do not use towel bars as grab bars.  Use non-skid mats or decals in the tub or shower.  If you need to sit down in the shower, use a plastic, non-slip stool.  Keep the floor dry. Clean up any water that spills on the floor as soon as it happens.  Remove soap buildup in the tub or shower regularly.  Attach bath mats securely with double-sided non-slip rug tape.  Do not have throw rugs and other things on the floor that can make you trip. What can I do in the bedroom?  Use night lights.  Make sure that you have a light by your bed that is easy to reach.  Do not use any sheets or blankets that are too big for your bed. They should  not hang down onto the floor.  Have a firm chair that has side arms. You can use this for support while you get dressed.  Do not have throw rugs and other things on the floor that can make you trip. What can I do in the kitchen?  Clean up any spills right away.  Avoid walking on wet floors.  Keep items that you use a lot in easy-to-reach places.  If you need to reach something above you, use a strong step stool that has a grab bar.  Keep electrical cords out of the way.  Do not use floor polish or wax that makes floors slippery. If you must use wax, use non-skid floor wax.  Do not have throw rugs and other things on the floor that can make you trip. What can I do with my stairs?  Do not leave any items on the stairs.  Make sure that there are handrails on both sides of the stairs and use them. Fix handrails that are broken or loose. Make sure that handrails are as long as the stairways.  Check any carpeting to make sure that it is firmly attached to the stairs. Fix any carpet that is loose or worn.  Avoid having throw rugs at the top or bottom of the stairs. If you do have throw rugs, attach them to the floor with carpet tape.  Make sure that you have a light switch at the top of the stairs and the bottom of the stairs. If you do not have them, ask someone to add them for you. What else can I do to help prevent falls?  Wear shoes that: ? Do not have high heels. ? Have rubber bottoms. ? Are comfortable and fit you well. ? Are closed at the toe. Do not wear sandals.  If you use a stepladder: ? Make sure that it is fully opened. Do not climb a closed stepladder. ? Make sure that both sides of the stepladder are locked into place. ? Ask someone to hold it for you, if possible.  Clearly mark and make sure that you can see: ? Any grab bars or handrails. ? First and last steps. ? Where the edge of each step is.  Use tools that help you move around (mobility aids) if they are  needed. These include: ? Canes. ? Walkers. ? Scooters. ? Crutches.  Turn on the lights when you go into a dark area. Replace any light bulbs as soon as they burn out.  Set up your  furniture so you have a clear path. Avoid moving your furniture around.  If any of your floors are uneven, fix them.  If there are any pets around you, be aware of where they are.  Review your medicines with your doctor. Some medicines can make you feel dizzy. This can increase your chance of falling. Ask your doctor what other things that you can do to help prevent falls. This information is not intended to replace advice given to you by your health care provider. Make sure you discuss any questions you have with your health care provider. Document Released: 09/25/2009 Document Revised: 05/06/2016 Document Reviewed: 01/03/2015 Elsevier Interactive Patient Education  2018 [place] Maintenance, Male A healthy lifestyle and preventive care is important for your health and wellness. Ask your health care provider about what schedule of regular examinations is right for you. What should I know about weight and diet? Eat a Healthy Diet  Eat plenty of vegetables, fruits, whole grains, low-fat dairy products, and lean protein.  Do not eat a lot of foods high in solid fats, added sugars, or salt.  Maintain a Healthy Weight Regular exercise can help you achieve or maintain a healthy weight. You should:  Do at least 150 minutes of exercise each week. The exercise should increase your heart rate and make you sweat (moderate-intensity exercise).  Do strength-training exercises at least twice a week.  Watch Your Levels of Cholesterol and Blood Lipids  Have your blood tested for lipids and cholesterol every 5 years starting at 75 years of age. If you are at high risk for heart disease, you should start having your blood tested when you are 75 years old. You may need to have your cholesterol levels  checked more often if: ? Your lipid or cholesterol levels are high. ? You are older than 75 years of age. ? You are at high risk for heart disease.  What should I know about cancer screening? Many types of cancers can be detected early and may often be prevented. Lung Cancer  You should be screened every year for lung cancer if: ? You are a current smoker who has smoked for at least 30 years. ? You are a former smoker who has quit within the past 15 years.  Talk to your health care provider about your screening options, when you should start screening, and how often you should be screened.  Colorectal Cancer  Routine colorectal cancer screening usually begins at 75 years of age and should be repeated every 5-10 years until you are 75 years old. You may need to be screened more often if early forms of precancerous polyps or small growths are found. Your health care provider may recommend screening at an earlier age if you have risk factors for colon cancer.  Your health care provider may recommend using home test kits to check for hidden blood in the stool.  A small camera at the end of a tube can be used to examine your colon (sigmoidoscopy or colonoscopy). This checks for the earliest forms of colorectal cancer.  Prostate and Testicular Cancer  Depending on your age and overall health, your health care provider may do certain tests to screen for prostate and testicular cancer.  Talk to your health care provider about any symptoms or concerns you have about testicular or prostate cancer.  Skin Cancer  Check your skin from head to toe regularly.  Tell your health care provider about any new moles or changes  in moles, especially if: ? There is a change in a mole's size, shape, or color. ? You have a mole that is larger than a pencil eraser.  Always use sunscreen. Apply sunscreen liberally and repeat throughout the day.  Protect yourself by wearing long sleeves, pants, a  wide-brimmed hat, and sunglasses when outside.  What should I know about heart disease, diabetes, and high blood pressure?  If you are 40-9 years of age, have your blood pressure checked every 3-5 years. If you are 56 years of age or older, have your blood pressure checked every year. You should have your blood pressure measured twice-once when you are at a hospital or clinic, and once when you are not at a hospital or clinic. Record the average of the two measurements. To check your blood pressure when you are not at a hospital or clinic, you can use: ? An automated blood pressure machine at a pharmacy. ? A home blood pressure monitor.  Talk to your health care provider about your target blood pressure.  If you are between 11-32 years old, ask your health care provider if you should take aspirin to prevent heart disease.  Have regular diabetes screenings by checking your fasting blood sugar level. ? If you are at a normal weight and have a low risk for diabetes, have this test once every three years after the age of 65. ? If you are overweight and have a high risk for diabetes, consider being tested at a younger age or more often.  A one-time screening for abdominal aortic aneurysm (AAA) by ultrasound is recommended for men aged 18-75 years who are current or former smokers. What should I know about preventing infection? Hepatitis B If you have a higher risk for hepatitis B, you should be screened for this virus. Talk with your health care provider to find out if you are at risk for hepatitis B infection. Hepatitis C Blood testing is recommended for:  Everyone born from [birth year] through 1964.  Anyone with known risk factors for hepatitis C.  Sexually Transmitted Diseases (STDs)  You should be screened each year for STDs including gonorrhea and chlamydia if: ? You are sexually active and are younger than 75 years of age. ? You are older than 75 years of age and your health care provider  tells you that you are at risk for this type of infection. ? Your sexual activity has changed since you were last screened and you are at an increased risk for chlamydia or gonorrhea. Ask your health care provider if you are at risk.  Talk with your health care provider about whether you are at high risk of being infected with HIV. Your health care provider may recommend a prescription medicine to help prevent HIV infection.  What else can I do?  Schedule regular health, dental, and eye exams.  Stay current with your vaccines (immunizations).  Do not use any tobacco products, such as cigarettes, chewing tobacco, and e-cigarettes. If you need help quitting, ask your health care provider.  Limit alcohol intake to no more than 2 drinks per day. One drink equals 12 ounces of beer, 5 ounces of wine, or 1 ounces of hard liquor.  Do not use street drugs.  Do not share needles.  Ask your health care provider for help if you need support or information about quitting drugs.  Tell your health care provider if you often feel depressed.  Tell your health care provider if you have ever been  abused or do not feel safe at home. This information is not intended to replace advice given to you by your health care provider. Make sure you discuss any questions you have with your health care provider. Document Released: 05/27/2008 Document Revised: 07/28/2016 Document Reviewed: 09/02/2015 Elsevier Interactive Patient Education  [employer].
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
## 2017-10-21 ENCOUNTER — Telehealth: Payer: Self-pay | Admitting: Family Medicine

## 2017-10-21 DIAGNOSIS — R42 Dizziness and giddiness: Secondary | ICD-10-CM

## 2017-10-21 NOTE — Telephone Encounter (Signed)
OK to refer back

## 2017-10-21 NOTE — Telephone Encounter (Signed)
° ° ° ° °  Pt call to ask for a referral to go back to neuro rehab. He said he is having the symptons for vertigo again and he contacted them and they told him that the referral just need to be put into the system

## 2017-11-09 ENCOUNTER — Ambulatory Visit: Payer: Medicare Other | Attending: Family Medicine

## 2017-11-09 DIAGNOSIS — R42 Dizziness and giddiness: Secondary | ICD-10-CM

## 2017-11-09 DIAGNOSIS — H8112 Benign paroxysmal vertigo, left ear: Secondary | ICD-10-CM | POA: Diagnosis present

## 2017-11-09 DIAGNOSIS — R2689 Other abnormalities of gait and mobility: Secondary | ICD-10-CM | POA: Diagnosis present

## 2017-11-09 NOTE — Therapy (Signed)
[facility] [address] [place], [place], [address] Phone: [phone]   Fax:  [phone]  Physical Therapy Evaluation  Patient Details  Name: Hector Lewis MRN: [id number] Date of Birth: 75-Jan-1942 Referring Provider: Dr. Elease Hashimoto   Encounter Date: 11/09/2017  PT End of Session - 11/09/17 1636    Visit Number  1    Number of Visits  5    Date for PT Re-Evaluation  12/09/17    Authorization Type  [employer] Medicare: G-CODE AND PN EVERY 10TH VISIT    PT Start Time  0931    PT Stop Time  1010    PT Time Calculation (min)  39 min    Equipment Utilized During Treatment  -- S for safety    Activity Tolerance  Patient tolerated treatment well    Behavior During Therapy  WFL for tasks assessed/performed       Past Medical History:  Diagnosis Date  . Arthritis   . BUNION, LEFT FOOT 09/16/2008  . 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 GERD 12/29/2010  . HYPERLIPIDEMIA 05/30/2009  . Hypertension   . Hyperthyroidism 1980's   resolved now, took medicine at the time  . MEDIAL MENISCUS TEAR, RIGHT 10/17/2008  . METATARSALGIA 01/27/2010  . 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  . 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    Past Surgical History:  Procedure Laterality Date  . APPENDECTOMY     done with colectomy  . COLON SURGERY  2008   precancerous polyps  . colonectomy  2008  . [place]  2012  . KNEE ARTHROSCOPY Right   . TONSILLECTOMY    . 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   Procedure: TOTAL KNEE ARTHROPLASTY;  Surgeon: Dorna Leitz, MD;  Location: [facility];  Service: Orthopedics;  Laterality: Right;    There were no vitals filed for this visit.   Subjective Assessment - 11/09/17 0938    Subjective  Pt reports he woke up one day with vertigo (about 3 weeks ago), pt has hx of dizziness. He is careful when getting OOB, as first thing in the morning dizziness is worse and he feels unsteady. Pt self treats with L Epley  treatment, which helps. He is able to go to the [facility] 3x/week  and is able to play Pickleball but has difficulty turning quickly 2/2 dizziness. Pt denied falls. Pt denied N/T, HA, and tinnitus.     Pertinent History  Hx of vertigo, colectomy, R TKA, HTN, HLD, arthritis, L foot bunion    Patient Stated Goals  Not have dizziness    Currently in Pain?  No/denies         [facility] PT Assessment - 11/09/17 0947      Assessment   Medical Diagnosis  Vertigo    Referring Provider  Dr. Elease Hashimoto    Onset Date/Surgical Date  10/13/17    Hand Dominance  Right    Prior Therapy  OPPT      Precautions   Precautions  None      Restrictions   Weight Bearing Restrictions  No      Balance Screen   Has the patient fallen in the past 6 months  No    Has the patient had a decrease in activity level because of a fear of falling?   No    Is the patient reluctant to leave their home because of a fear of falling?   No      Home  Film/video editor residence    Living Arrangements  Spouse/significant other    Available Help at Discharge  Family    Type of [place] to enter    Entrance Stairs-Number of Steps  6    [place]  One level    [place]  None;Grab bars - tub/shower;Shower seat - built in      Prior Function   Level of Independence  Independent    Vocation  Retired    Automotive engineer, Visual merchandiser, walk Community education officer   Overall Cognitive Status  Within Functional Limits for tasks assessed      Ambulation/Gait   Ambulation/Gait  Yes    Ambulation/Gait Assistance  7: Independent    Ambulation Distance (Feet)  100 Feet    Assistive device  None    Gait Pattern  Within Functional Limits    Ambulation Surface  Level;Indoor    Gait velocity  4.69ft/sec.    Gait Comments  Pt denied dizziness during amb.          Vestibular Assessment - 11/09/17 0953      Vestibular Assessment   General  Observation  Walks IND into clinic      Symptom Behavior   Type of Dizziness  Spinning    Frequency of Dizziness  Daily    Duration of Dizziness  <5 seconds    Aggravating Factors  Looking up to the ceiling;Rolling to left;Turning head quickly    Relieving Factors  Head stationary      Occulomotor Exam   Occulomotor Alignment  Normal    Spontaneous  Absent    Gaze-induced  Absent    Smooth Pursuits  Intact    Saccades  Intact    Comment  Pt denied dizziness during occulmotor. B HIT (-).      Vestibulo-Occular Reflex   VOR 1 Head Only (x 1 viewing)  able to maintain gaze with minimal report of wooziness (1-2/10)      Visual Acuity   Static  Line 8    Dynamic  Line 3      Positional Testing   Dix-Hallpike  Dix-Hallpike Right;Dix-Hallpike Left    Horizontal Canal Testing  Horizontal Canal Right;Horizontal Canal Left      Dix-Hallpike Right   Dix-Hallpike Right Duration  none    Dix-Hallpike Right Symptoms  No nystagmus      Dix-Hallpike Left   Dix-Hallpike Left Duration  Pt reported dizziness for < 5seconds (wooziness), no nystagmus noted.     Dix-Hallpike Left Symptoms  No nystagmus      Horizontal Canal Right   Horizontal Canal Right Duration  none     Horizontal Canal Right Symptoms  Normal      Horizontal Canal Left   Horizontal Canal Left Duration  Pt reported wooziness <5 sec.    Horizontal Canal Left Symptoms  Normal      Positional Sensitivities   Up from Right Hallpike  Lightheadedness    Up from Left Hallpike  Mild dizziness         Objective measurements completed on examination: See above findings.       Vestibular Treatment/Exercise - 11/09/17 1634      Vestibular Treatment/Exercise   Vestibular Treatment Provided  Canalith Repositioning    Canalith Repositioning  Epley Manuever Left       EPLEY MANUEVER  LEFT   Number of Reps   1    Overall Response   Improved Symptoms     RESPONSE DETAILS LEFT  Pt reported improved dizziness after  treatment.             PT Education - 11/09/17 1636    Education provided  Yes    Education Details  PT educated pt on exam findings, outcome measures, PT POC, duration and frequency. PT also educated on treatment.     Person(s) Educated  Patient    Methods  Explanation    Comprehension  Verbalized understanding       PT Short Term Goals - 11/09/17 1644      PT SHORT TERM GOAL #1   Title  same as LTGs        PT Long Term Goals - 11/09/17 1644      PT LONG TERM GOAL #1   Title  Pt will report 0/10 during all positional testing to reduce dizziness and improve QOL. TARGET DATE FOR ALL LTGS: 12/07/17    Status  New      PT LONG TERM GOAL #2   Title  Pt will amb. 1000' over uneven terrain, while performing head turns, IND without dizziness or LOB to improve safety while walking his dog.     Status  New      PT LONG TERM GOAL #3   Title  Perform FGA and write goal as indicated.       PT LONG TERM GOAL #4   Title  Pt will perform DVA/SVA testing with <3 line difference to improve dizziness.     Status  New             Plan - 11/09/17 1638    Clinical Impression Statement  Pt is a pleasant 76y/o male presenting to OPPT neuro with vertigo. Pt's PMH significant for the following: Hx of vertigo, colectomy, R TKA, HTN, HLD, arthritis, L foot bunion. Pt experienced concordant spinning/wooziness sensation during L Dix-Hallpike, although no nystagmus was noted [place] were not donned), pt reported improve s/s after L Epley treatment. Pt's DVA vs. SVA line difference was 5 lines, which is significant. Pt also experienced dizziness during VOR all indicating decr. vestibular input. PT will formally assess pt's balance next session by performing the FGA. Pt's gait speed was WNL. Pt would benefit from skilled PT to improve safety during functional mobility.     History and Personal Factors relevant to plan of care:  Pt has a new puppy which he takes for walks and takes it outside  in the morning (when pt is most dizzy), enjoys traveling    Clinical Presentation  Stable    Clinical Presentation due to:  Hx of vertigo, colectomy, R TKA, HTN, HLD, arthritis, L foot bunion    Rehab Potential  Good    Clinical Impairments Affecting Rehab Potential  see above    PT Frequency  1x / week    PT Duration  4 weeks    PT Treatment/Interventions  ADLs/Self Care Home Management;Neuromuscular re-education;Balance training;Patient/family education;Vestibular;Therapeutic exercise;Therapeutic activities;Manual techniques;Canalith Repostioning;Biofeedback;DME Instruction;Gait training;Stair training    PT Next Visit Plan  Reassess for L pBPPV and treat as indicated, perform FGA and write goal as indicated. Provide pt with x1 viewing HEP and balance HEP.    Consulted and Agree with Plan of Care  Patient       Patient will benefit from skilled therapeutic intervention in order to improve the following deficits  and impairments:  Decreased balance, Dizziness, Abnormal gait  Visit Diagnosis: BPPV (benign paroxysmal positional vertigo), left - Plan: PT plan of care cert/re-cert  Dizziness and giddiness - Plan: PT plan of care cert/re-cert  Other abnormalities of gait and mobility - Plan: PT plan of care cert/re-cert  G-Codes - 42/87/68 1646    Functional Assessment Tool Used (Outpatient Only)  DVA vs. SVA: 5 line difference, clinical judgment    Functional Limitation  Self care    Self Care Current Status (T1572)  At least 40 percent but less than 60 percent impaired, limited or restricted    Self Care Goal Status (I2035)  At least 1 percent but less than 20 percent impaired, limited or restricted        Problem List Patient Active Problem List   Diagnosis Date Noted  . History of adenomatous polyp of colon 01/24/2017  . Primary osteoarthritis of right knee 02/16/2016  . Right knee pain 08/03/2013  . GERD 12/29/2010  . 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  . METATARSALGIA 01/27/2010   . VIRAL INFECTION 01/23/2010  . HYPERLIPIDEMIA 05/30/2009  . MEDIAL MENISCUS TEAR, RIGHT 10/17/2008  . KNEE PAIN, RIGHT 09/16/2008  . BUNION, LEFT FOOT 09/16/2008  . 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    Charon Smedberg L 11/09/2017, 4:48 PM  [facility] [address] [place], [place], [address] Phone: [phone]   Fax:  [phone]  Name: SAVVA BEAMER MRN: [id number] Date of Birth: 08-Aug-1941  Geoffry Paradise, PT,DPT 11/09/17 4:48 PM Phone: [phone] Fax: [phone]

## 2017-11-17 ENCOUNTER — Ambulatory Visit: Payer: Medicare Other | Attending: Family Medicine

## 2017-11-17 DIAGNOSIS — R42 Dizziness and giddiness: Secondary | ICD-10-CM | POA: Diagnosis not present

## 2017-11-17 DIAGNOSIS — R2689 Other abnormalities of gait and mobility: Secondary | ICD-10-CM | POA: Diagnosis present

## 2017-11-17 DIAGNOSIS — H8112 Benign paroxysmal vertigo, left ear: Secondary | ICD-10-CM | POA: Diagnosis present

## 2017-11-17 NOTE — Therapy (Signed)
[facility] [address] [place], [place], [address] Phone: [phone]   Fax:  [phone]  Physical Therapy Treatment  Patient Details  Name: Hector Lewis MRN: [id number] Date of Birth: 03-Feb-1941 Referring Provider: Dr. Elease Hashimoto   Encounter Date: 11/17/2017  PT End of Session - 11/17/17 0826    Visit Number  2    Number of Visits  5    Date for PT Re-Evaluation  12/09/17    Authorization Type  [employer] Medicare: G-CODE AND PN EVERY 10TH VISIT    PT Start Time  0806 pt late    PT Stop Time  0821    PT Time Calculation (min)  15 min    Equipment Utilized During Treatment  -- min guard prn    Activity Tolerance  Patient tolerated treatment well    Behavior During Therapy  [facility] for tasks assessed/performed       Past Medical History:  Diagnosis Date  . Arthritis   . BUNION, LEFT FOOT 09/16/2008  . 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 GERD 12/29/2010  . HYPERLIPIDEMIA 05/30/2009  . Hypertension   . Hyperthyroidism 1980's   resolved now, took medicine at the time  . MEDIAL MENISCUS TEAR, RIGHT 10/17/2008  . METATARSALGIA 01/27/2010  . 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  . 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    Past Surgical History:  Procedure Laterality Date  . APPENDECTOMY     done with colectomy  . COLON SURGERY  2008   precancerous polyps  . colonectomy  2008  . [place]  2012  . KNEE ARTHROSCOPY Right   . TONSILLECTOMY    . 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   Procedure: TOTAL KNEE ARTHROPLASTY;  Surgeon: Dorna Leitz, MD;  Location: [facility];  Service: Orthopedics;  Laterality: Right;    There were no vitals filed for this visit.  Subjective Assessment - 11/17/17 0808    Subjective  Pt reported he feels so much better, no dizziness since last visit.     Pertinent History  Hx of vertigo, colectomy, R TKA, HTN, HLD, arthritis, L foot bunion    Patient Stated Goals  Not have dizziness    Currently  in Pain?  No/denies         [facility] PT Assessment - 11/17/17 0809      Functional Gait  Assessment   Gait assessed   Yes    Gait Level Surface  Walks 20 ft in less than 5.5 sec, no assistive devices, good speed, no evidence for imbalance, normal gait pattern, deviates no more than 6 in outside of the 12 in walkway width.    Change in Gait Speed  Able to smoothly change walking speed without loss of balance or gait deviation. Deviate no more than 6 in outside of the 12 in walkway width.    Gait with Horizontal Head Turns  Performs head turns smoothly with no change in gait. Deviates no more than 6 in outside 12 in walkway width    Gait with Vertical Head Turns  Performs head turns with no change in gait. Deviates no more than 6 in outside 12 in walkway width.    Gait and Pivot Turn  Pivot turns safely within 3 sec and stops quickly with no loss of balance.    Step Over Obstacle  Is able to step over 2 stacked shoe boxes taped together (9 in total height) without changing gait speed. No evidence of  imbalance.    Gait with Narrow Base of Support  Is able to ambulate for 10 steps heel to toe with no staggering.    Gait with Eyes Closed  Walks 20 ft, no assistive devices, good speed, no evidence of imbalance, normal gait pattern, deviates no more than 6 in outside 12 in walkway width. Ambulates 20 ft in less than 7 sec.    Ambulating Backwards  Walks 20 ft, no assistive devices, good speed, no evidence for imbalance, normal gait    Steps  Alternating feet, no rail.    Total Score  30    FGA comment:  30/30: WNL, no risk for falls                   Vestibular Treatment/Exercise - 11/17/17 0102      Vestibular Treatment/Exercise   Vestibular Treatment Provided  Gaze    Gaze Exercises  X1 Viewing Horizontal;X1 Viewing Vertical      X1 Viewing Horizontal   Foot Position  standing feet apart    Comments  30 seconds horizontal plane with cues on fixation and speed of movement. Pt  denied dizziness      X1 Viewing Vertical   Foot Position  standing with feet apart, closer to target and glasses removed    Comments  30 sec. with no dizziness noted. Cues and demo for technique.             PT Education - 11/17/17 0825    Education provided  Yes    Education Details  PT educated pt on FGA score. PT also explained that we will place pt on hold for 2 weeks, as vertigo is cleared and will d/c after 2 weeks if pt remains asymptomatic. PT educated pt that nothing can prevent BPPV from reoccuring.    Person(s) Educated  Patient    Methods  Explanation    Comprehension  Verbalized understanding       PT Short Term Goals - 11/09/17 1644      PT SHORT TERM GOAL #1   Title  same as LTGs        PT Long Term Goals - 11/09/17 1644      PT LONG TERM GOAL #1   Title  Pt will report 0/10 during all positional testing to reduce dizziness and improve QOL. TARGET DATE FOR ALL LTGS: 12/07/17    Status  New      PT LONG TERM GOAL #2   Title  Pt will amb. 1000' over uneven terrain, while performing head turns, IND without dizziness or LOB to improve safety while walking his dog.     Status  New      PT LONG TERM GOAL #3   Title  Perform FGA and write goal as indicated.       PT LONG TERM GOAL #4   Title  Pt will perform DVA/SVA testing with <3 line difference to improve dizziness.     Status  New            Plan - 11/17/17 7253    Clinical Impression Statement  Pt's FGA score is WNL, no falls risk. Pt reported no dizziness or LOB while performing all positional changes and walking dog. Therefore, vertigo appears to have resolved. PT will place pt on hold for 2 weeks and d/c at that time, if vertigo has not returned.     Rehab Potential  Good    Clinical Impairments Affecting Rehab Potential  see above    PT Frequency  1x / week    PT Duration  4 weeks    PT Treatment/Interventions  ADLs/Self Care Home Management;Neuromuscular re-education;Balance  training;Patient/family education;Vestibular;Therapeutic exercise;Therapeutic activities;Manual techniques;Canalith Repostioning;Biofeedback;DME Instruction;Gait training;Stair training    PT Next Visit Plan  Reassess for L pBPPV and treat as indicated. Provide pt with x1 viewing HEP and balance HEP.    Consulted and Agree with Plan of Care  Patient       Patient will benefit from skilled therapeutic intervention in order to improve the following deficits and impairments:  Decreased balance, Dizziness, Abnormal gait  Visit Diagnosis: Dizziness and giddiness  Other abnormalities of gait and mobility  BPPV (benign paroxysmal positional vertigo), left     Problem List Patient Active Problem List   Diagnosis Date Noted  . History of adenomatous polyp of colon 01/24/2017  . Primary osteoarthritis of right knee 02/16/2016  . Right knee pain 08/03/2013  . GERD 12/29/2010  . 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  . METATARSALGIA 01/27/2010  . VIRAL INFECTION 01/23/2010  . HYPERLIPIDEMIA 05/30/2009  . MEDIAL MENISCUS TEAR, RIGHT 10/17/2008  . KNEE PAIN, RIGHT 09/16/2008  . BUNION, LEFT FOOT 09/16/2008  . 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    Traquan Duarte L 11/17/2017, 8:28 AM  [facility] [address] [place], [place], [address] Phone: [phone]   Fax:  [phone]  Name: JDYN PARKERSON MRN: [id number] Date of Birth: June 11, 1941  Geoffry Paradise, PT,DPT 11/17/17 8:29 AM Phone: [phone] Fax: [phone]

## 2017-12-25 ENCOUNTER — Other Ambulatory Visit: Payer: Self-pay | Admitting: Family Medicine

## 2018-01-26 ENCOUNTER — Other Ambulatory Visit: Payer: Medicare Other

## 2018-01-31 ENCOUNTER — Ambulatory Visit (INDEPENDENT_AMBULATORY_CARE_PROVIDER_SITE_OTHER): Payer: Medicare Other | Admitting: Family Medicine

## 2018-01-31 ENCOUNTER — Encounter: Payer: Self-pay | Admitting: Family Medicine

## 2018-01-31 VITALS — BP 124/70 | HR 60 | Temp 97.9°F | Ht 69.75 in | Wt 187.5 lb

## 2018-01-31 DIAGNOSIS — Z0000 Encounter for general adult medical examination without abnormal findings: Secondary | ICD-10-CM

## 2018-01-31 LAB — HEPATIC FUNCTION PANEL
ALT: 29 U/L (ref 0–53)
AST: 30 U/L (ref 0–37)
Albumin: 4.4 g/dL (ref 3.5–5.2)
Alkaline Phosphatase: 66 U/L (ref 39–117)
Bilirubin, Direct: 0.2 mg/dL (ref 0.0–0.3)
Total Bilirubin: 1.3 mg/dL — ABNORMAL HIGH (ref 0.2–1.2)
Total Protein: 6.9 g/dL (ref 6.0–8.3)

## 2018-01-31 LAB — CBC WITH DIFFERENTIAL/PLATELET
Basophils Absolute: 0.1 10*3/uL (ref 0.0–0.1)
Basophils Relative: 2.4 % (ref 0.0–3.0)
Eosinophils Absolute: 0.2 10*3/uL (ref 0.0–0.7)
Eosinophils Relative: 3.6 % (ref 0.0–5.0)
HCT: 46.5 % (ref 39.0–52.0)
Hemoglobin: 16 g/dL (ref 13.0–17.0)
Lymphocytes Relative: 27.9 % (ref 12.0–46.0)
Lymphs Abs: 1.4 10*3/uL (ref 0.7–4.0)
MCHC: 34.5 g/dL (ref 30.0–36.0)
MCV: 93.3 fl (ref 78.0–100.0)
Monocytes Absolute: 0.4 10*3/uL (ref 0.1–1.0)
Monocytes Relative: 8.5 % (ref 3.0–12.0)
Neutro Abs: 2.8 10*3/uL (ref 1.4–7.7)
Neutrophils Relative %: 57.6 % (ref 43.0–77.0)
Platelets: 173 10*3/uL (ref 150.0–400.0)
RBC: 4.99 Mil/uL (ref 4.22–5.81)
RDW: 13.1 % (ref 11.5–15.5)
WBC: 4.9 10*3/uL (ref 4.0–10.5)

## 2018-01-31 LAB — LIPID PANEL
Cholesterol: 165 mg/dL (ref 0–200)
HDL: 49 mg/dL (ref >39.00)
LDL Cholesterol: 95 mg/dL (ref 0–99)
NonHDL: 116.42
Total CHOL/HDL Ratio: 3
Triglycerides: 109 mg/dL (ref 0.0–149.0)
VLDL: 21.8 mg/dL (ref 0.0–40.0)

## 2018-01-31 LAB — BASIC METABOLIC PANEL
BUN: 16 mg/dL (ref 6–23)
CO2: 27 mEq/L (ref 19–32)
Calcium: 9.5 mg/dL (ref 8.4–10.5)
Chloride: 105 mEq/L (ref 96–112)
Creatinine, Ser: 0.94 mg/dL (ref 0.40–1.50)
GFR: 82.96 mL/min (ref >60.00)
Glucose, Bld: 110 mg/dL — ABNORMAL HIGH (ref 70–99)
Potassium: 4.2 mEq/L (ref 3.5–5.1)
Sodium: 138 mEq/L (ref 135–145)

## 2018-01-31 LAB — TSH: TSH: 2.83 u[IU]/mL (ref 0.35–4.50)

## 2018-01-31 NOTE — Progress Notes (Signed)
Subjective:     Patient ID: Hector Lewis, male   DOB: 03-Aug-1941, 77 y.o.   MRN: [id number]  HPI Patient for physical exam. Doing very well overall. He exercises most days of the week. Has lost a few pounds since last year due to his efforts. His immunizations are up-to-date. He sees dermatology yearly. Due for repeat colonoscopy later this year and is our he schedule for that. Prior history of colon polyps.  Patient has history of GERD which is well-controlled with medication. He has hyperlipidemia on Lipitor. Father lived to be 58. Father did have prostate cancer. Patient has requested PSA screening in the past but would like to consider going to every other year.  Past Medical History:  Diagnosis Date  . Arthritis   . BUNION, LEFT FOOT 09/16/2008  . 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 GERD 12/29/2010  . HYPERLIPIDEMIA 05/30/2009  . Hypertension   . Hyperthyroidism 1980's   resolved now, took medicine at the time  . MEDIAL MENISCUS TEAR, RIGHT 10/17/2008  . METATARSALGIA 01/27/2010  . 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  . 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   Past Surgical History:  Procedure Laterality Date  . APPENDECTOMY     done with colectomy  . COLON SURGERY  2008   precancerous polyps  . colonectomy  2008  . [place]  2012  . KNEE ARTHROSCOPY Right   . TONSILLECTOMY    . 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   Procedure: TOTAL KNEE ARTHROPLASTY;  Surgeon: Dorna Leitz, MD;  Location: [facility];  Service: Orthopedics;  Laterality: Right;    reports that he quit smoking about 49 years ago. His smoking use included cigarettes. He has a 6.00 pack-year smoking history. he has never used smokeless tobacco. He reports that he drinks alcohol. He reports that he does not use drugs. family history includes Cancer in his father; Hyperlipidemia in his mother. Allergies  Allergen Reactions  . Codeine Sulfate Itching and Rash     Review of Systems  Constitutional: Negative for  fatigue.  Eyes: Negative for visual disturbance.  Respiratory: Negative for cough, chest tightness and shortness of breath.   Cardiovascular: Negative for chest pain, palpitations and leg swelling.  Neurological: Negative for dizziness, syncope, weakness, light-headedness and headaches.       Objective:   Physical Exam  Constitutional: He is oriented to person, place, and time. He appears well-developed and well-nourished.  HENT:  Right Ear: External ear normal.  Left Ear: External ear normal.  Mouth/Throat: Oropharynx is clear and moist.  Eyes: Pupils are equal, round, and reactive to light.  Neck: Neck supple. No thyromegaly present.  Cardiovascular: Normal rate and regular rhythm.  Pulmonary/Chest: Effort normal and breath sounds normal. No respiratory distress. He has no wheezes. He has no rales.  Musculoskeletal: He exhibits no edema.  Neurological: He is alert and oriented to person, place, and time.       Assessment:     Physical exam. Several issues addressed as below    Plan:     -obtain screening labs -Discussed PSA screening and he would like to wait until next year and then will rediscuss at that point -Immunizations all up-to-date -Repeat colonoscopy later this year -discussed pros and cons of aspirin therapy. He has fairly low risk of CAD other than age and will consider discontinuing  [name] MD [facility]

## 2018-02-01 ENCOUNTER — Encounter: Payer: Self-pay | Admitting: Family Medicine

## 2018-03-22 ENCOUNTER — Other Ambulatory Visit: Payer: Self-pay | Admitting: Family Medicine

## 2018-04-04 NOTE — Therapy (Signed)
[facility] [address] [place], [place], [address] Phone: [phone]   Fax:  [phone]  Patient Details  Name: Hector Lewis MRN: [id number] Date of Birth: 1940-12-29 Referring Provider:  No ref. provider found  Encounter Date: 04/04/2018  PHYSICAL THERAPY DISCHARGE SUMMARY  Visits from Start of Care: 2  Current functional level related to goals / functional outcomes: PT Long Term Goals - 11/09/17 1644      PT LONG TERM GOAL #1   Title  Pt will report 0/10 during all positional testing to reduce dizziness and improve QOL. TARGET DATE FOR ALL LTGS: 12/07/17    Status  New      PT LONG TERM GOAL #2   Title  Pt will amb. 1000' over uneven terrain, while performing head turns, IND without dizziness or LOB to improve safety while walking his dog.     Status  New      PT LONG TERM GOAL #3   Title  Perform FGA and write goal as indicated.       PT LONG TERM GOAL #4   Title  Pt will perform DVA/SVA testing with <3 line difference to improve dizziness.     Status  New         Remaining deficits: The second visit, pt reported dizziness had resolved and PT placed pt on hold. He was to return if dizziness returned, which pt never called back. Pt's FGA score was 30/30, which is WNL.   Education / Equipment: POC  Plan: Patient agrees to discharge.  Patient goals were not met. Patient is being discharged due to not returning since the last visit.  ?????       Miller,Jennifer L 04/04/2018, 10:06 AM  [facility] [address] [place], [place], [address] Phone: [phone]   Fax:  [phone]   Geoffry Paradise, PT,DPT 04/04/18 10:08 AM Phone: [phone] Fax: [phone]

## 2018-04-14 ENCOUNTER — Encounter: Payer: Self-pay | Admitting: Family Medicine

## 2018-04-14 ENCOUNTER — Ambulatory Visit: Payer: Medicare Other | Admitting: Family Medicine

## 2018-04-14 VITALS — BP 140/90 | HR 65 | Temp 98.4°F | Wt 183.1 lb

## 2018-04-14 DIAGNOSIS — K4090 Unilateral inguinal hernia, without obstruction or gangrene, not specified as recurrent: Secondary | ICD-10-CM | POA: Diagnosis not present

## 2018-04-14 NOTE — Progress Notes (Signed)
  Subjective:     Patient ID: Hector Lewis, male   DOB: October 19, 1941, 77 y.o.   MRN: [id number]  HPI Patient seen with bulge right inguinal region which he first noticed Wednesday. He was doing a lot of lifting then. That night when in the shower he noticed some swelling in that region. No pain. No prior history of hernia. Generally very active. No dysuria.  Past Medical History:  Diagnosis Date  . Arthritis   . BUNION, LEFT FOOT 09/16/2008  . 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 GERD 12/29/2010  . HYPERLIPIDEMIA 05/30/2009  . Hypertension   . Hyperthyroidism 1980's   resolved now, took medicine at the time  . MEDIAL MENISCUS TEAR, RIGHT 10/17/2008  . METATARSALGIA 01/27/2010  . 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  . 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   Past Surgical History:  Procedure Laterality Date  . APPENDECTOMY     done with colectomy  . COLON SURGERY  2008   precancerous polyps  . colonectomy  2008  . [place]  2012  . KNEE ARTHROSCOPY Right   . TONSILLECTOMY    . 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 His smoking use included cigarettes. He has a 6.00 pack-year smoking history. He has never used smokeless tobacco. He reports that he drinks alcohol. He reports that he does not use drugs. family history includes Cancer in his father; Hyperlipidemia in his mother. Allergies  Allergen Reactions  . Codeine Sulfate Itching and Rash     Review of Systems  Constitutional: Negative for chills and fever.  Gastrointestinal: Negative for abdominal pain and blood in stool.       Objective:   Physical Exam  Constitutional: He appears well-developed and well-nourished.  Cardiovascular: Normal rate and regular rhythm.  Abdominal:  Patient has right inguinal mass consistent with hernia. This is soft  and nontender.       Assessment:     Right inguinal hernia. First noted a couple days ago after some straining. Patient is asymptomatic at this time but would like to explore possible repair    Plan:     -Set up referral to general surgeon. Avoid any heavy lifting in the meantime  [name] MD [facility]

## 2018-04-14 NOTE — Patient Instructions (Signed)

## 2018-05-02 ENCOUNTER — Ambulatory Visit: Payer: Self-pay | Admitting: Surgery

## 2018-05-02 DIAGNOSIS — K4090 Unilateral inguinal hernia, without obstruction or gangrene, not specified as recurrent: Secondary | ICD-10-CM

## 2018-05-02 DIAGNOSIS — K4020 Bilateral inguinal hernia, without obstruction or gangrene, not specified as recurrent: Secondary | ICD-10-CM | POA: Insufficient documentation

## 2018-05-02 DIAGNOSIS — Z87898 Personal history of other specified conditions: Secondary | ICD-10-CM | POA: Insufficient documentation

## 2018-05-02 NOTE — H&P (Signed)
Lewis Lewis Documented: 05/02/2018 9:31 AM Location: [facility] Patient #: [id number] DOB: June 09, 1941 Married / Language: Lewis Lewis / Race: White Male  History of Present Illness Hector Hector MD; 05/02/2018 10:00 AM) The patient is a 77 year old male who presents with an inguinal hernia. Note for "Inguinal hernia": ` ` ` Patient sent for surgical consultation at the request of Dr. Carolann Littler  Chief Complaint: Right groin swelling. Probable hernia.  The patient is a pleasant active male who noticed right groin bulging in the shower. He thinks it's has gotten a bit larger. Occasionally uncomfortable and sensitive but no episodes of severe sharp pain. He mentioned it to his primary care physician. Inguinal hernia diagnosed. Surgical consultation offered. Patient has some sensitivity in the left groin but no real pain or swelling there. He gets up to urinate about twice at night. He did have an episode of urinary retention after his knee replacement a few years ago by Dr. Berenice Primas. Required in and out catheterization. Usually moves his bowels about 2 or 3 times a day. He does not smoke. He had a colectomy for a unresectable polyp in 2008 by my partner, Dr. Zella Richer, who has since retired. No other abdominal surgeries. Patient denies any history of infections. He goes to the gym at least 3 times a week and enjoys playing pickle ball. 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 Otherwise all other 12 point ROS are negative) ` ` `   Past Surgical History Mammie Lorenzo, LPN; 05/26/4314 4:00 AM) Colon Polyp Removal - Colonoscopy Colon Removal - Partial Foot Surgery Right. Knee Surgery Right.  Diagnostic Studies History Mammie Lorenzo, LPN; 8/67/6195 0:93 AM) Colonoscopy 1-5 years ago  Allergies Mammie Lorenzo,  LPN; 2/67/1245 8:09 AM) Codeine and Related Rash, Itching.  Medication History Mammie Lorenzo, LPN; 9/83/3825 0:53 AM) Atorvastatin Calcium (20MG  Tablet, Oral) Active. Pantoprazole Sodium (40MG  Tablet DR, Oral) Active. Fish Oil (1000MG  Capsule, Oral) Active. Probiotic (Oral) Active. Medications Reconciled  Social History Mammie Lorenzo, LPN; 9/76/7341 9:37 AM) Alcohol use Moderate alcohol use. Caffeine use Coffee. No drug use Tobacco use Former smoker.  Family History Mammie Lorenzo, LPN; 08/14/4096 3:53 AM) Colon Cancer Father. Hypertension Mother. Prostate Cancer Father.  Other Problems Mammie Lorenzo, LPN; 2/99/2426 8:34 AM) Back Pain Gastroesophageal Reflux Disease Hypercholesterolemia Thyroid Disease     Review of Systems Claiborne Billings Dockery LPN; 1/96/2229 7:98 AM) General Not Present- Appetite Loss, Chills, Fatigue, Fever, Night Sweats, Weight Gain and Weight Loss. Skin Not Present- Change in Wart/Mole, Dryness, Hives, Jaundice, New Lesions, Non-Healing Wounds, Rash and Ulcer. HEENT Present- Seasonal Allergies and Wears glasses/contact lenses. Not Present- Earache, Hearing Loss, Hoarseness, Nose Bleed, Oral Ulcers, Ringing in the Ears, Sinus Pain, Sore Throat, Visual Disturbances and Yellow Eyes. Respiratory Not Present- Bloody sputum, Chronic Cough, Difficulty Breathing, Snoring and Wheezing. Breast Not Present- Breast Mass, Breast Pain, Nipple Discharge and Skin Changes. Cardiovascular Not Present- Chest Pain, Difficulty Breathing Lying Down, Leg Cramps, Palpitations, Rapid Heart Rate, Shortness of Breath and Swelling of Extremities. Gastrointestinal Not Present- Abdominal Pain, Bloating, Bloody Stool, Change in Bowel Habits, Chronic diarrhea, Constipation, Difficulty Swallowing, Excessive gas, Gets full quickly at meals, Hemorrhoids, Indigestion, Nausea, Rectal Pain and Vomiting. Male Genitourinary Not Present- Blood in Urine, Change in Urinary Stream,  Frequency, Impotence, Nocturia, Painful Urination, Urgency and Urine Leakage. Musculoskeletal Not Present-  Back Pain, Joint Pain, Joint Stiffness, Muscle Pain, Muscle Weakness and Swelling of Extremities. Neurological Not Present- Decreased Memory, Fainting, Headaches, Numbness, Seizures, Tingling, Tremor, Trouble walking and Weakness. Psychiatric Not Present- Anxiety, Bipolar, Change in Sleep Pattern, Depression, Fearful and Frequent crying. Endocrine Not Present- Cold Intolerance, Excessive Hunger, Hair Changes, Heat Intolerance, Hot flashes and New Diabetes. Hematology Not Present- Blood Thinners, Easy Bruising, Excessive bleeding, Gland problems, HIV and Persistent Infections.  Vitals Claiborne Billings Dockery LPN; 0/16/0109 3:23 AM) 05/02/2018 9:31 AM Weight: 184.8 lb Height: 70.5in Body Surface Area: 2.03 m Body Mass Index: 26.14 kg/m  Temp.: 97.33F(Temporal)  Pulse: 92 (Regular)  BP: 142/84 (Sitting, Left Arm, Standard)      Physical Exam Hector Hector MD; 05/02/2018 9:57 AM)  General Mental Status-Alert. General Appearance-Not in acute distress, Not Sickly. Orientation-Oriented X3. Hydration-Well hydrated. Voice-Normal.  Integumentary Global Assessment Upon inspection and palpation of skin surfaces of the - Axillae: non-tender, no inflammation or ulceration, no drainage. and Distribution of scalp and body hair is normal. General Characteristics Temperature - normal warmth is noted.  Head and Neck Head-normocephalic, atraumatic with no lesions or palpable masses. Face Global Assessment - atraumatic, no absence of expression. Neck Global Assessment - no abnormal movements, no bruit auscultated on the right, no bruit auscultated on the left, no decreased range of motion, non-tender. Trachea-midline. Thyroid Gland Characteristics - non-tender.  Eye Eyeball - Left-Extraocular movements intact, No Nystagmus. Eyeball - Right-Extraocular movements  intact, No Nystagmus. Cornea - Left-No Hazy. Cornea - Right-No Hazy. Sclera/Conjunctiva - Left-No scleral icterus, No Discharge. Sclera/Conjunctiva - Right-No scleral icterus, No Discharge. Pupil - Left-Direct reaction to light normal. Pupil - Right-Direct reaction to light normal.  ENMT Ears Pinna - Left - no drainage observed, no generalized tenderness observed. Right - no drainage observed, no generalized tenderness observed. Nose and Sinuses External Inspection of the Nose - no destructive lesion observed. Inspection of the nares - Left - quiet respiration. Right - quiet respiration. Mouth and Throat Lips - Upper Lip - no fissures observed, no pallor noted. Lower Lip - no fissures observed, no pallor noted. Nasopharynx - no discharge present. Oral Cavity/Oropharynx - Tongue - no dryness observed. Oral Mucosa - no cyanosis observed. Hypopharynx - no evidence of airway distress observed.  Chest and Lung Exam Inspection Movements - Normal and Symmetrical. Accessory muscles - No use of accessory muscles in breathing. Palpation Palpation of the chest reveals - Non-tender. Auscultation Breath sounds - Normal and Clear.  Cardiovascular Auscultation Rhythm - Regular. Murmurs & Other Heart Sounds - Auscultation of the heart reveals - No Murmurs and No Systolic Clicks.  Abdomen Inspection Inspection of the abdomen reveals - No Visible peristalsis and No Abnormal pulsations. Umbilicus - No Bleeding, No Urine drainage. Palpation/Percussion Palpation and Percussion of the abdomen reveal - Soft, Non Tender, No Rebound tenderness, No Rigidity (guarding) and No Cutaneous hyperesthesia. Note: Abdomen soft. Nontender. Not distended. No umbilical or incisional hernias. No guarding.  Male Genitourinary Sexual Maturity Tanner 5 - Adult hair pattern and Adult penile size and shape. Note: ` ` ` Obvious right groin bulging moderate size. Sensitive but reducible. Consistent  with moderate size RIGHT inguinal hernia. Nothing definite on the left side but sensitive at the external ring  Peripheral Vascular Upper Extremity Inspection - Left - No Cyanotic nailbeds, Not Ischemic. Right - No Cyanotic nailbeds, Not Ischemic.  Neurologic Neurologic evaluation reveals -normal attention span and ability to concentrate, able to name objects and repeat phrases. Appropriate fund of knowledge ,  normal sensation and normal coordination. Mental Status Affect - not angry, not paranoid. Cranial Nerves-Normal Bilaterally. Gait-Normal.  Neuropsychiatric Mental status exam performed with findings of-able to articulate well with normal speech/language, rate, volume and coherence, thought content normal with ability to perform basic computations and apply abstract reasoning and no evidence of hallucinations, delusions, obsessions or homicidal/suicidal ideation.  Musculoskeletal Global Assessment Spine, Ribs and Pelvis - no instability, subluxation or laxity. Right Upper Extremity - no instability, subluxation or laxity.  Lymphatic Head & Neck  General Head & Neck Lymphatics: Bilateral - Description - No Localized lymphadenopathy. Axillary  General Axillary Region: Bilateral - Description - No Localized lymphadenopathy. Femoral & Inguinal  Generalized Femoral & Inguinal Lymphatics: Left - Description - No Localized lymphadenopathy. Right - Description - No Localized lymphadenopathy.    Assessment & Plan Hector Hector MD; 05/02/2018 9:55 AM)  RIGHT INGUINAL HERNIA (K40.90) Impression: Pleasant active retired male with moderate size right inguinal hernia. Some sensitivity left groin as well.  I think he would benefit from hernia repair. Laparoscopic approach.  He did have an episode of urinary retention after his knee replacement. Therefore, we'll place him on Flomax one week preop & then one week postoperatively to hopefully minimize the risk of  that.   PREOP - ING HERNIA - ENCOUNTER FOR PREOPERATIVE EXAMINATION FOR GENERAL SURGICAL PROCEDURE (Z01.818)  Current Plans You are being scheduled for surgery- Our schedulers will call you.  You should hear from our office's scheduling department within 5 working days about the location, date, and time of surgery. We try to make accommodations for patient's preferences in scheduling surgery, but sometimes the OR schedule or the surgeon's schedule prevents [place] from making those accommodations.  If you have not heard from our office [phone]) in 5 working days, call the office and ask for your surgeon's nurse.  If you have other questions about your diagnosis, plan, or surgery, call the office and ask for your surgeon's nurse.  Written instructions provided The anatomy & physiology of the abdominal wall and pelvic floor was discussed. The pathophysiology of hernias in the inguinal and pelvic region was discussed. Natural history risks such as progressive enlargement, pain, incarceration, and strangulation was discussed. Contributors to complications such as smoking, obesity, diabetes, prior surgery, etc were discussed.  I feel the risks of no intervention will lead to serious problems that outweigh the operative risks; therefore, I recommended surgery to reduce and repair the hernia. I explained laparoscopic techniques with possible need for an open approach. I noted usual use of mesh to patch and/or buttress hernia repair  Risks such as bleeding, infection, abscess, need for further treatment, heart attack, death, and other risks were discussed. I noted a good likelihood this will help address the problem. Goals of post-operative recovery were discussed as well. Possibility that this will not correct all symptoms was explained. I stressed the importance of low-impact activity, aggressive pain control, avoiding constipation, & not pushing through pain to minimize risk of  post-operative chronic pain or injury. Possibility of reherniation was discussed. We will work to minimize complications.  An educational handout further explaining the pathology & treatment options was given as well. Questions were answered. The patient expresses understanding & wishes to proceed with surgery.  Pt Education - Pamphlet Given - Laparoscopic Hernia Repair: discussed with patient and provided information. Pt Education - [facility] Pain Control (Marabella Popiel) Pt Education - [facility] Hernia Post-Op HCI (Omere Marti): discussed with patient and provided information. Pt Education - [facility] Mesh education:  discussed with patient and provided information.  HISTORY OF URINARY RETENTION (S89.791) Impression: Given his history of urinary retention, we'll place him on perioperative Flomax to hopefully minimize the risk this time around.  Current Plans Started Flomax 0.4 MG Oral Capsule, 1 (one) Capsule daily, #14, 14 days starting 05/02/2018, Ref. x2. Local Order: to shrink the prostate and help with urination  Lewis Lewis, M.D., F.A.C.S. Gastrointestinal and Minimally Invasive Surgery [facility] [address] [place], [place] [id number] [phone] Main / Paging

## 2018-05-13 HISTORY — PX: COLONOSCOPY: SHX174

## 2018-05-15 LAB — HM COLONOSCOPY

## 2018-05-17 ENCOUNTER — Encounter: Payer: Self-pay | Admitting: Family Medicine

## 2018-06-12 ENCOUNTER — Encounter (HOSPITAL_COMMUNITY)
Admission: RE | Admit: 2018-06-12 | Discharge: 2018-06-12 | Disposition: A | Discharge status: Home / Self Care | Payer: Medicare Other | Source: Ambulatory Visit | Attending: Surgery | Admitting: Surgery

## 2018-06-12 ENCOUNTER — Encounter (HOSPITAL_COMMUNITY): Payer: Self-pay

## 2018-06-12 ENCOUNTER — Other Ambulatory Visit: Payer: Self-pay

## 2018-06-12 DIAGNOSIS — Z01810 Encounter for preprocedural cardiovascular examination: Secondary | ICD-10-CM | POA: Diagnosis present

## 2018-06-12 DIAGNOSIS — Z01812 Encounter for preprocedural laboratory examination: Secondary | ICD-10-CM | POA: Diagnosis present

## 2018-06-12 DIAGNOSIS — R001 Bradycardia, unspecified: Secondary | ICD-10-CM | POA: Diagnosis not present

## 2018-06-12 HISTORY — DX: Personal history of colon polyps, unspecified: Z86.0100

## 2018-06-12 HISTORY — DX: Essential (primary) hypertension: I10

## 2018-06-12 HISTORY — DX: Other complications of anesthesia, initial encounter: T88.59XA

## 2018-06-12 HISTORY — DX: Adverse effect of unspecified anesthetic, initial encounter: T41.45XA

## 2018-06-12 HISTORY — DX: Personal history of colonic polyps: Z86.010

## 2018-06-12 LAB — BASIC METABOLIC PANEL
Anion gap: 9 (ref 5–15)
BUN: 18 mg/dL (ref 8–23)
CO2: 24 mmol/L (ref 22–32)
Calcium: 9.4 mg/dL (ref 8.9–10.3)
Chloride: 106 mmol/L (ref 98–111)
Creatinine, Ser: 0.94 mg/dL (ref 0.61–1.24)
GFR calc Af Amer: >60 mL/min (ref >60)
GFR calc non Af Amer: >60 mL/min (ref >60)
Glucose, Bld: 95 mg/dL (ref 70–99)
Potassium: 4 mmol/L (ref 3.5–5.1)
Sodium: 139 mmol/L (ref 135–145)

## 2018-06-12 LAB — CBC
HCT: 44.4 % (ref 39.0–52.0)
Hemoglobin: 15.4 g/dL (ref 13.0–17.0)
MCH: 31.4 pg (ref 26.0–34.0)
MCHC: 34.7 g/dL (ref 30.0–36.0)
MCV: 90.6 fL (ref 78.0–100.0)
Platelets: 193 10*3/uL (ref 150–400)
RBC: 4.9 MIL/uL (ref 4.22–5.81)
RDW: 12.7 % (ref 11.5–15.5)
WBC: 6.7 10*3/uL (ref 4.0–10.5)

## 2018-06-12 MED ORDER — ENSURE PRE-SURGERY PO LIQD
296.0000 mL | Freq: Once | ORAL | Status: DC
  Filled 2018-06-12: qty 296

## 2018-06-12 NOTE — Patient Instructions (Addendum)
Hector Lewis  06/12/2018   Your procedure is scheduled on: 06/22/2018     Report to [facility] Main  Entrance    Report to admitting at    0530 AM    Call this number if you have problems the morning of surgery [phone]    Remember: Do not eat food:After Midnight.   Only clear liquids after midnight.       CLEAR LIQUID DIET   Foods Allowed                                                                     Foods Excluded  Coffee and tea, regular and decaf                             liquids that you cannot  Plain Jell-O in any flavor                                             see through such as: Fruit ices (not with fruit pulp)                                     milk, soups, orange juice  Iced Popsicles                                    All solid food Carbonated beverages, regular and diet                                    Cranberry, grape and apple juices Sports drinks like Gatorade Lightly seasoned clear broth or consume(fat free) Sugar, honey syrup  Sample Menu Breakfast                                Lunch                                     Supper Cranberry juice                    Beef broth                            Chicken broth Jell-O                                     Grape juice                           Apple juice Coffee or tea  Jell-O                                      Popsicle                                                Coffee or tea                        Coffee or tea    Nothing in your mouth after 4:30AM day of surgery    Please finish carbohydrate drink at 4:30AM day of surgery     Take these medicines the morning of surgery with A SIP OF WATER:Eye drops if needed                                 You may not have any metal on your body including jewelry and body piercings     Do not wear lotions, powders or perfumes, deodorant                        Men may shave face and  neck.   Do not bring valuables to the hospital. [place].   Contacts, dentures or bridgework may not be worn into surgery.    Patients discharged the day of surgery will not be allowed to drive home.                Please read over the following fact sheets you were given: _____________________________________________________________________         [facility] - Preparing for Surgery Before surgery, you can play an important role.  Because skin is not sterile, your skin needs to be as free of germs as possible.  You can reduce the number of germs on your skin by washing with CHG (chlorahexidine gluconate) soap before surgery.  CHG is an antiseptic cleaner which kills germs and bonds with the skin to continue killing germs even after washing. Please DO NOT use if you have an allergy to CHG or antibacterial soaps.  If your skin becomes reddened/irritated stop using the CHG and inform your nurse when you arrive at Short Stay. Do not shave (including legs and underarms) for at least 48 hours prior to the first CHG shower.  You may shave your face/neck. Please follow these instructions carefully:  1.  Shower with CHG Soap the night before surgery and the  morning of Surgery.  2.  If you choose to wash your hair, wash your hair first as usual with your  normal  shampoo.  3.  After you shampoo, rinse your hair and body thoroughly to remove the  shampoo.                           4.  Use CHG as you would any other liquid soap.  You can apply chg directly  to the skin and wash  Gently with a scrungie or clean washcloth.  5.  Apply the CHG Soap to your body ONLY FROM THE NECK DOWN.   Do not use on face/ open                           Wound or open sores. Avoid contact with eyes, ears mouth and genitals (private parts).                       Wash face,  Genitals (private parts) with your normal soap.             6.  Wash  thoroughly, paying special attention to the area where your surgery  will be performed.  7.  Thoroughly rinse your body with warm water from the neck down.  8.  DO NOT shower/wash with your normal soap after using and rinsing off  the CHG Soap.                9.  Pat yourself dry with a clean towel.            10.  Wear clean pajamas.            11.  Place clean sheets on your bed the night of your first shower and do not  sleep with pets. Day of Surgery : Do not apply any lotions/deodorants the morning of surgery.  Please wear clean clothes to the hospital/surgery center.  FAILURE TO FOLLOW THESE INSTRUCTIONS MAY RESULT IN THE CANCELLATION OF YOUR SURGERY PATIENT SIGNATURE_________________________________  NURSE SIGNATURE__________________________________  ________________________________________________________________________

## 2018-06-16 ENCOUNTER — Inpatient Hospital Stay (HOSPITAL_COMMUNITY): Admission: RE | Admit: 2018-06-16 | Payer: Medicare Other | Source: Ambulatory Visit

## 2018-06-21 NOTE — Anesthesia Preprocedure Evaluation (Signed)
Anesthesia Evaluation  Patient identified by MRN, date of birth, ID band Patient awake  General Assessment Comment:Complication of anesthesia  had urinary retention after surgery cause elevated heart rate and severe abd pain once catheterized symptoms resolved   Reviewed: Allergy & Precautions, Patient's Chart, lab work & pertinent test results  History of Anesthesia Complications (+) history of anesthetic complications  Airway Mallampati: II  TM Distance: >3 FB Neck ROM: Full    Dental no notable dental hx.    Pulmonary neg pulmonary ROS, former smoker,    Pulmonary exam normal breath sounds clear to auscultation       Cardiovascular hypertension, Normal cardiovascular exam Rhythm:Regular Rate:Normal     Neuro/Psych negative psych ROS   GI/Hepatic GERD  Medicated,  Endo/Other  Hyperthyroidism   Renal/GU      Musculoskeletal  (+) Arthritis , Osteoarthritis,    Abdominal   Peds  Hematology   Anesthesia Other Findings   Reproductive/Obstetrics                             BP Readings from Last 3 Encounters:  06/12/18 (!) 155/82  04/14/18 140/90  01/31/18 124/70   Lab Results  Component Value Date   WBC 6.7 06/12/2018   HGB 15.4 06/12/2018   HCT 44.4 06/12/2018   MCV 90.6 06/12/2018   PLT 193 06/12/2018     Chemistry      Component Value Date/Time   NA 139 06/12/2018 1339   K 4.0 06/12/2018 1339   CL 106 06/12/2018 1339   CO2 24 06/12/2018 1339   BUN 18 06/12/2018 1339   CREATININE 0.94 06/12/2018 1339      Component Value Date/Time   CALCIUM 9.4 06/12/2018 1339   ALKPHOS 66 01/31/2018 0945   AST 30 01/31/2018 0945   ALT 29 01/31/2018 0945   BILITOT 1.3 (H) 01/31/2018 0945      Anesthesia Physical  Anesthesia Plan  ASA: II  Anesthesia Plan: General   Post-op Pain Management:    Induction: Intravenous  PONV Risk Score and Plan: 1 and Ondansetron, Treatment may  vary due to age or medical condition and Dexamethasone  Airway Management Planned: Oral ETT  Additional Equipment:   Intra-op Plan:   Post-operative Plan: Extubation in OR  Informed Consent: I have reviewed the patients History and Physical, chart, labs and discussed the procedure including the risks, benefits and alternatives for the proposed anesthesia with the patient or authorized representative who has indicated his/her understanding and acceptance.   Dental advisory given  Plan Discussed with: CRNA, Surgeon and Anesthesiologist  Anesthesia Plan Comments: (  )        Anesthesia Quick Evaluation

## 2018-06-22 ENCOUNTER — Ambulatory Visit (HOSPITAL_COMMUNITY)
Admission: RE | Admit: 2018-06-22 | Discharge: 2018-06-22 | Disposition: A | Discharge status: Home / Self Care | Payer: Medicare Other | Source: Ambulatory Visit | Attending: Surgery | Admitting: Surgery

## 2018-06-22 ENCOUNTER — Encounter (HOSPITAL_COMMUNITY): Payer: Self-pay | Admitting: *Deleted

## 2018-06-22 ENCOUNTER — Telehealth (HOSPITAL_COMMUNITY): Payer: Self-pay | Admitting: *Deleted

## 2018-06-22 ENCOUNTER — Ambulatory Visit (HOSPITAL_COMMUNITY): Payer: Medicare Other | Admitting: Anesthesiology

## 2018-06-22 ENCOUNTER — Encounter (HOSPITAL_COMMUNITY)
Admission: RE | Disposition: A | Discharge status: Home / Self Care | Payer: Self-pay | Source: Ambulatory Visit | Attending: Surgery

## 2018-06-22 DIAGNOSIS — K4020 Bilateral inguinal hernia, without obstruction or gangrene, not specified as recurrent: Secondary | ICD-10-CM | POA: Diagnosis not present

## 2018-06-22 DIAGNOSIS — K219 Gastro-esophageal reflux disease without esophagitis: Secondary | ICD-10-CM | POA: Insufficient documentation

## 2018-06-22 DIAGNOSIS — Z96651 Presence of right artificial knee joint: Secondary | ICD-10-CM | POA: Diagnosis not present

## 2018-06-22 DIAGNOSIS — Z87891 Personal history of nicotine dependence: Secondary | ICD-10-CM | POA: Diagnosis not present

## 2018-06-22 DIAGNOSIS — Z79899 Other long term (current) drug therapy: Secondary | ICD-10-CM | POA: Insufficient documentation

## 2018-06-22 DIAGNOSIS — I10 Essential (primary) hypertension: Secondary | ICD-10-CM | POA: Diagnosis not present

## 2018-06-22 DIAGNOSIS — Z9049 Acquired absence of other specified parts of digestive tract: Secondary | ICD-10-CM | POA: Insufficient documentation

## 2018-06-22 DIAGNOSIS — R1909 Other intra-abdominal and pelvic swelling, mass and lump: Secondary | ICD-10-CM | POA: Diagnosis present

## 2018-06-22 DIAGNOSIS — D176 Benign lipomatous neoplasm of spermatic cord: Secondary | ICD-10-CM | POA: Diagnosis not present

## 2018-06-22 DIAGNOSIS — E7800 Pure hypercholesterolemia, unspecified: Secondary | ICD-10-CM | POA: Insufficient documentation

## 2018-06-22 DIAGNOSIS — Z87898 Personal history of other specified conditions: Secondary | ICD-10-CM

## 2018-06-22 HISTORY — PX: INGUINAL HERNIA REPAIR: SHX194

## 2018-06-22 HISTORY — PX: INSERTION OF MESH: SHX5868

## 2018-06-22 SURGERY — REPAIR, HERNIA, INGUINAL, LAPAROSCOPIC
Anesthesia: General | Site: Inguinal

## 2018-06-22 MED ORDER — KETOROLAC TROMETHAMINE 15 MG/ML IJ SOLN
INTRAMUSCULAR | Status: DC | PRN
  Administered 2018-06-22: 15 mg via INTRAVENOUS

## 2018-06-22 MED ORDER — ONDANSETRON HCL 4 MG/2ML IJ SOLN: 4.0000 mg | Freq: Once | INTRAMUSCULAR | Status: DC | PRN

## 2018-06-22 MED ORDER — MEPERIDINE HCL 50 MG/ML IJ SOLN: 6.2500 mg | INTRAMUSCULAR | Status: DC | PRN

## 2018-06-22 MED ORDER — ACETAMINOPHEN 500 MG PO TABS
1000.0000 mg | ORAL_TABLET | ORAL | Status: AC
  Administered 2018-06-22: 1000 mg via ORAL
  Filled 2018-06-22: qty 2

## 2018-06-22 MED ORDER — GABAPENTIN 300 MG PO CAPS
300.0000 mg | ORAL_CAPSULE | ORAL | Status: AC
  Administered 2018-06-22: 300 mg via ORAL
  Filled 2018-06-22: qty 1

## 2018-06-22 MED ORDER — ONDANSETRON HCL 4 MG/2ML IJ SOLN
INTRAMUSCULAR | Status: AC
  Filled 2018-06-22: qty 2

## 2018-06-22 MED ORDER — GLYCOPYRROLATE 0.2 MG/ML IJ SOLN
INTRAMUSCULAR | Status: DC | PRN
  Administered 2018-06-22 (×3): 0.1 mg via INTRAVENOUS

## 2018-06-22 MED ORDER — 0.9 % SODIUM CHLORIDE (POUR BTL) OPTIME
TOPICAL | Status: DC | PRN
  Administered 2018-06-22: 1000 mL

## 2018-06-22 MED ORDER — ACETAMINOPHEN 160 MG/5ML PO SOLN: 325.0000 mg | ORAL | Status: DC | PRN

## 2018-06-22 MED ORDER — PHENYLEPHRINE HCL 10 MG/ML IJ SOLN
INTRAMUSCULAR | Status: DC | PRN
  Administered 2018-06-22: 80 ug via INTRAVENOUS
  Administered 2018-06-22 (×2): 40 ug via INTRAVENOUS

## 2018-06-22 MED ORDER — PROPOFOL 10 MG/ML IV BOLUS
INTRAVENOUS | Status: DC | PRN
  Administered 2018-06-22: 50 mg via INTRAVENOUS
  Administered 2018-06-22: 40 mg via INTRAVENOUS
  Administered 2018-06-22: 110 mg via INTRAVENOUS

## 2018-06-22 MED ORDER — CHLORHEXIDINE GLUCONATE CLOTH 2 % EX PADS: 6.0000 | MEDICATED_PAD | Freq: Once | CUTANEOUS | Status: DC

## 2018-06-22 MED ORDER — SUGAMMADEX SODIUM 200 MG/2ML IV SOLN
INTRAVENOUS | Status: AC
  Filled 2018-06-22: qty 2

## 2018-06-22 MED ORDER — LIDOCAINE HCL (PF) 2 % IJ SOLN
INTRAMUSCULAR | Status: DC | PRN
  Administered 2018-06-22: 100 mg via INTRADERMAL

## 2018-06-22 MED ORDER — KETAMINE HCL 10 MG/ML IJ SOLN
INTRAMUSCULAR | Status: AC
  Filled 2018-06-22: qty 1

## 2018-06-22 MED ORDER — FENTANYL CITRATE (PF) 100 MCG/2ML IJ SOLN
INTRAMUSCULAR | Status: AC
  Filled 2018-06-22: qty 2

## 2018-06-22 MED ORDER — BUPIVACAINE-EPINEPHRINE 0.25% -1:200000 IJ SOLN
INTRAMUSCULAR | Status: DC | PRN
  Administered 2018-06-22: 60 mL

## 2018-06-22 MED ORDER — ACETAMINOPHEN 325 MG PO TABS: 325.0000 mg | ORAL_TABLET | ORAL | Status: DC | PRN

## 2018-06-22 MED ORDER — GLYCOPYRROLATE 0.2 MG/ML IV SOSY
PREFILLED_SYRINGE | INTRAVENOUS | Status: AC
  Filled 2018-06-22: qty 3

## 2018-06-22 MED ORDER — CEFAZOLIN SODIUM-DEXTROSE 2-4 GM/100ML-% IV SOLN
2.0000 g | INTRAVENOUS | Status: AC
  Administered 2018-06-22: 2 g via INTRAVENOUS
  Filled 2018-06-22: qty 100

## 2018-06-22 MED ORDER — FENTANYL CITRATE (PF) 100 MCG/2ML IJ SOLN
INTRAMUSCULAR | Status: DC | PRN
  Administered 2018-06-22 (×4): 50 ug via INTRAVENOUS

## 2018-06-22 MED ORDER — DEXAMETHASONE SODIUM PHOSPHATE 10 MG/ML IJ SOLN
INTRAMUSCULAR | Status: AC
  Filled 2018-06-22: qty 1

## 2018-06-22 MED ORDER — KETAMINE HCL 10 MG/ML IJ SOLN
INTRAMUSCULAR | Status: DC | PRN
  Administered 2018-06-22: 21 mg via INTRAVENOUS

## 2018-06-22 MED ORDER — PROPOFOL 10 MG/ML IV BOLUS
INTRAVENOUS | Status: AC
  Filled 2018-06-22: qty 40

## 2018-06-22 MED ORDER — TRAMADOL HCL 50 MG PO TABS: 50.0000 mg | ORAL_TABLET | Freq: Four times a day (QID) | ORAL | 0 refills | Status: DC | PRN

## 2018-06-22 MED ORDER — STERILE WATER FOR IRRIGATION IR SOLN
Status: DC | PRN
  Administered 2018-06-22: 1000 mL

## 2018-06-22 MED ORDER — ROCURONIUM BROMIDE 100 MG/10ML IV SOLN
INTRAVENOUS | Status: DC | PRN
  Administered 2018-06-22: 5 mg via INTRAVENOUS
  Administered 2018-06-22: 45 mg via INTRAVENOUS
  Administered 2018-06-22: 20 mg via INTRAVENOUS
  Administered 2018-06-22: 10 mg via INTRAVENOUS

## 2018-06-22 MED ORDER — ONDANSETRON HCL 4 MG/2ML IJ SOLN
INTRAMUSCULAR | Status: DC | PRN
  Administered 2018-06-22: 4 mg via INTRAVENOUS

## 2018-06-22 MED ORDER — LIDOCAINE 2% (20 MG/ML) 5 ML SYRINGE
INTRAMUSCULAR | Status: AC
  Filled 2018-06-22: qty 5

## 2018-06-22 MED ORDER — ROCURONIUM BROMIDE 100 MG/10ML IV SOLN
INTRAVENOUS | Status: AC
  Filled 2018-06-22: qty 1

## 2018-06-22 MED ORDER — BUPIVACAINE-EPINEPHRINE (PF) 0.25% -1:200000 IJ SOLN
INTRAMUSCULAR | Status: AC
  Filled 2018-06-22: qty 60

## 2018-06-22 MED ORDER — EPHEDRINE SULFATE 50 MG/ML IJ SOLN
INTRAMUSCULAR | Status: DC | PRN
  Administered 2018-06-22 (×5): 5 mg via INTRAVENOUS

## 2018-06-22 MED ORDER — OXYCODONE HCL 5 MG PO TABS: 5.0000 mg | ORAL_TABLET | Freq: Once | ORAL | Status: DC | PRN

## 2018-06-22 MED ORDER — SUGAMMADEX SODIUM 200 MG/2ML IV SOLN
INTRAVENOUS | Status: DC | PRN
  Administered 2018-06-22: 200 mg via INTRAVENOUS

## 2018-06-22 MED ORDER — LACTATED RINGERS IV SOLN
INTRAVENOUS | Status: DC
  Administered 2018-06-22: 06:00:00 via INTRAVENOUS

## 2018-06-22 MED ORDER — ESMOLOL HCL 100 MG/10ML IV SOLN
INTRAVENOUS | Status: AC
  Filled 2018-06-22: qty 10

## 2018-06-22 MED ORDER — FENTANYL CITRATE (PF) 100 MCG/2ML IJ SOLN
25.0000 ug | INTRAMUSCULAR | Status: DC | PRN
  Administered 2018-06-22: 50 ug via INTRAVENOUS

## 2018-06-22 MED ORDER — OXYCODONE HCL 5 MG/5ML PO SOLN
5.0000 mg | Freq: Once | ORAL | Status: DC | PRN
  Filled 2018-06-22: qty 5

## 2018-06-22 MED ORDER — SUCCINYLCHOLINE CHLORIDE 200 MG/10ML IV SOSY
PREFILLED_SYRINGE | INTRAVENOUS | Status: AC
  Filled 2018-06-22: qty 10

## 2018-06-22 MED ORDER — PHENYLEPHRINE 40 MCG/ML (10ML) SYRINGE FOR IV PUSH (FOR BLOOD PRESSURE SUPPORT)
PREFILLED_SYRINGE | INTRAVENOUS | Status: AC
  Filled 2018-06-22: qty 10

## 2018-06-22 MED ORDER — LACTATED RINGERS IR SOLN
Status: DC | PRN
  Administered 2018-06-22: 1000 mL

## 2018-06-22 MED ORDER — DEXAMETHASONE SODIUM PHOSPHATE 10 MG/ML IJ SOLN
INTRAMUSCULAR | Status: DC | PRN
  Administered 2018-06-22: 10 mg via INTRAVENOUS

## 2018-06-22 SURGICAL SUPPLY — 36 items
CABLE HIGH FREQUENCY MONO STRZ (ELECTRODE) ×3 IMPLANT
CHLORAPREP W/TINT 26ML (MISCELLANEOUS) ×3 IMPLANT
COVER SURGICAL LIGHT HANDLE (MISCELLANEOUS) ×3 IMPLANT
DECANTER SPIKE VIAL GLASS SM (MISCELLANEOUS) ×3 IMPLANT
DEVICE SECURE STRAP 25 ABSORB (INSTRUMENTS) IMPLANT
DRAPE WARM FLUID 44X44 (DRAPE) ×3 IMPLANT
DRSG TEGADERM 2-3/8X2-3/4 SM (GAUZE/BANDAGES/DRESSINGS) ×3 IMPLANT
DRSG TEGADERM 4X4.75 (GAUZE/BANDAGES/DRESSINGS) ×3 IMPLANT
ELECT REM PT RETURN 15FT ADLT (MISCELLANEOUS) ×3 IMPLANT
GAUZE SPONGE 2X2 8PLY STRL LF (GAUZE/BANDAGES/DRESSINGS) ×2 IMPLANT
GLOVE ECLIPSE 8.0 STRL XLNG CF (GLOVE) ×3 IMPLANT
GLOVE INDICATOR 8.0 STRL GRN (GLOVE) ×3 IMPLANT
GOWN STRL REUS W/TWL XL LVL3 (GOWN DISPOSABLE) ×6 IMPLANT
IRRIG SUCT STRYKERFLOW 2 WTIP (MISCELLANEOUS) ×3
IRRIGATION SUCT STRKRFLW 2 WTP (MISCELLANEOUS) ×2 IMPLANT
KIT BASIN OR (CUSTOM PROCEDURE TRAY) ×3 IMPLANT
MESH HERNIA 6X6 BARD (Mesh General) ×4 IMPLANT
MESH HERNIA BARD 6X6 (Mesh General) ×2 IMPLANT
MESH ULTRAPRO 6X6 15CM15CM (Mesh General) ×3 IMPLANT
NEEDLE INSUFFLATION 14GA 120MM (NEEDLE) IMPLANT
PAD POSITIONING PINK XL (MISCELLANEOUS) ×3 IMPLANT
SCISSORS LAP 5X35 DISP (ENDOMECHANICALS) ×3 IMPLANT
SLEEVE ADV FIXATION 5X100MM (TROCAR) ×3 IMPLANT
SPONGE GAUZE 2X2 STER 10/PKG (GAUZE/BANDAGES/DRESSINGS) ×1
SUT MNCRL AB 4-0 PS2 18 (SUTURE) ×3 IMPLANT
SUT PDS AB 1 CT1 27 (SUTURE) ×3 IMPLANT
SUT VIC AB 2-0 SH 27 (SUTURE) ×1
SUT VIC AB 2-0 SH 27X BRD (SUTURE) ×2 IMPLANT
SUT VICRYL 0 UR6 27IN ABS (SUTURE) ×3 IMPLANT
TACKER 5MM HERNIA 3.5CML NAB (ENDOMECHANICALS) IMPLANT
TOWEL OR 17X26 10 PK STRL BLUE (TOWEL DISPOSABLE) ×3 IMPLANT
TOWEL OR NON WOVEN STRL DISP B (DISPOSABLE) ×3 IMPLANT
TRAY LAPAROSCOPIC (CUSTOM PROCEDURE TRAY) ×3 IMPLANT
TROCAR ADV FIXATION 5X100MM (TROCAR) ×3 IMPLANT
TROCAR XCEL BLUNT TIP 100MML (ENDOMECHANICALS) ×3 IMPLANT
TUBING INSUF HEATED (TUBING) ×3 IMPLANT

## 2018-06-22 NOTE — Anesthesia Procedure Notes (Signed)
Procedure Name: Intubation Date/Time: 06/22/2018 7:50 AM Performed by: Adalberto Ill, CRNA Pre-anesthesia Checklist: Patient identified, Emergency Drugs available, Suction available, Patient being monitored and Timeout performed Patient Re-evaluated:Patient Re-evaluated prior to induction Oxygen Delivery Method: Circle system utilized Preoxygenation: Pre-oxygenation with 100% oxygen Induction Type: IV induction Ventilation: Mask ventilation without difficulty Laryngoscope Size: Miller and 2 Grade View: Grade I Tube type: Oral Tube size: 7.5 mm Number of attempts: 2 Airway Equipment and Method: Bougie stylet and Video-laryngoscopy Placement Confirmation: ETT inserted through vocal cords under direct vision,  positive ETCO2 and breath sounds checked- equal and bilateral Secured at: 24 cm Tube secured with: Tape Dental Injury: Teeth and Oropharynx as per pre-operative assessment  Comments: Preoxygenation 100% FiO2 for more than 5 minutes

## 2018-06-22 NOTE — Discharge Instructions (Signed)
HERNIA REPAIR: POST OP INSTRUCTIONS ° °###################################################################### ° °EAT °Gradually transition to a high fiber diet with a fiber supplement over the next few weeks after discharge.  Start with a pureed / full liquid diet (see below) ° °WALK °Walk an hour a day.  Control your pain to do that.   ° °CONTROL PAIN °Control pain so that you can walk, sleep, tolerate sneezing/coughing, and go up/down stairs. ° °HAVE A BOWEL MOVEMENT DAILY °Keep your bowels regular to avoid problems.  OK to try a laxative to override constipation.  OK to use an antidairrheal to slow down diarrhea.  Call if not better after 2 tries ° °CALL IF YOU HAVE PROBLEMS/CONCERNS °Call if you are still struggling despite following these instructions. °Call if you have concerns not answered by these instructions ° °###################################################################### ° ° ° °1. DIET: Follow a light bland diet the first 24 hours after arrival home, such as soup, liquids, crackers, etc.  Be sure to include lots of fluids daily.  Advance to a low fat / high fiber diet over the next few days after surgery.  Avoid fast food or heavy meals the first week as your are more likely to get nauseated.   ° °2. Take your usually prescribed home medications unless otherwise directed. ° °3. PAIN CONTROL: °a. Pain is best controlled by a usual combination of three different methods TOGETHER: °i. Ice/Heat °ii. Over the counter pain medication °iii. Prescription pain medication °b. Most patients will experience some swelling and bruising around the hernia(s) such as the bellybutton, groins, or old incisions.  Ice packs or heating pads (30-60 minutes up to 6 times a day) will help. Use ice for the first few days to help decrease swelling and bruising, then switch to heat to help relax tight/sore spots and speed recovery.  Some people prefer to use ice alone, heat alone, alternating between ice & heat.  Experiment  to what works for you.  Swelling and bruising can take several weeks to resolve.   °c. It is helpful to take an over-the-counter pain medication regularly for the first few weeks.  Choose one of the following that works best for you: °i. Naproxen (Aleve, etc)  Two 220mg tabs twice a day °ii. Ibuprofen (Advil, etc) Three 200mg tabs four times a day (every meal & bedtime) °iii. Acetaminophen (Tylenol, etc) 325-650mg four times a day (every meal & bedtime) °d. A  prescription for pain medication should be given to you upon discharge.  Take your pain medication as prescribed.  °i. If you are having problems/concerns with the prescription medicine (does not control pain, nausea, vomiting, rash, itching, etc), please call us [phone] to see if we need to switch you to a different pain medicine that will work better for you and/or control your side effect better. °ii. If you need a refill on your pain medication, please contact your pharmacy.  They will contact our office to request authorization. Prescriptions will not be filled after 5 pm or on week-ends. ° °4. Avoid getting constipated.  Between the surgery and the pain medications, it is common to experience some constipation.  Increasing fluid intake and taking a fiber supplement (such as Metamucil, Citrucel, FiberCon, MiraLax, etc) 1-2 times a day regularly will usually help prevent this problem from occurring.  A mild laxative (prune juice, Milk of Magnesia, MiraLax, etc) should be taken according to package directions if there are no bowel movements after 48 hours.   ° °5. Wash / shower every   day.  You may shower over the dressings as they are waterproof.   ° °6. Remove your waterproof bandages, skin tapes, and other bandages 5 days after surgery. You may replace a dressing/Band-Aid to cover the incision for comfort if you wish. You may leave the incisions open to air.  You may replace a dressing/Band-Aid to cover an incision for comfort if you wish.   Continue to shower over incision(s) after the dressing is off. ° °7. ACTIVITIES as tolerated:   °a. You may resume regular (light) daily activities beginning the next day--such as daily self-care, walking, climbing stairs--gradually increasing activities as tolerated.  Control your pain so that you can walk an hour a day.  If you can walk 30 minutes without difficulty, it is safe to try more intense activity such as jogging, treadmill, bicycling, low-impact aerobics, swimming, etc. °b. Save the most intensive and strenuous activity for last such as sit-ups, heavy lifting, contact sports, etc  Refrain from any heavy lifting or straining until you are off narcotics for pain control.   °c. DO NOT PUSH THROUGH PAIN.  Let pain be your guide: If it hurts to do something, don't do it.  Pain is your body warning you to avoid that activity for another week until the pain goes down. °d. You may drive when you are no longer taking prescription pain medication, you can comfortably wear a seatbelt, and you can safely maneuver your car and apply brakes. °e. You may have sexual intercourse when it is comfortable.  ° °8. FOLLOW UP in our office °a. Please call [facility] at [phone] to set up an appointment to see your surgeon in the office for a follow-up appointment approximately 2-3 weeks after your surgery. °b. Make sure that you call for this appointment the day you arrive home to insure a convenient appointment time. ° °9.  If you have disability of FMLA / Family leave forms, please bring the forms to the office for processing.  (do not give to your surgeon). ° °WHEN TO CALL US [phone]: °1. Poor pain control °2. Reactions / problems with new medications (rash/itching, nausea, etc)  °3. Fever over 101.5 F (38.5 C) °4. Inability to urinate °5. Nausea and/or vomiting °6. Worsening swelling or bruising °7. Continued bleeding from incision. °8. Increased pain, redness, or drainage from the incision ° ° The clinic staff is  available to answer your questions during regular business hours (8:30am-5pm).  Please don’t hesitate to call and ask to speak to one of our nurses for clinical concerns.  ° If you have a medical emergency, go to the nearest emergency room or call 911. ° A surgeon from [facility] is always on call at the hospitals in [place] ° °[facility] °[address], [address], [place], [place]  [address] ? ° [address] [address], [place], [place]   [address] °MAIN: [phone] ? TOLL FREE: [phone] ? FAX: [phone] °[email or web address] ° °

## 2018-06-22 NOTE — Anesthesia Postprocedure Evaluation (Signed)
Anesthesia Post Note  Patient: [place]  Procedure(s) Performed: LAPAROSCOPIC BILATERAL INGUINAL HERNIA REPAIR (N/A Inguinal) INSERTION OF MESH (N/A )     Patient location during evaluation: PACU Anesthesia Type: General Level of consciousness: awake and alert Pain management: pain level controlled Vital Signs Assessment: post-procedure vital signs reviewed and stable Respiratory status: spontaneous breathing, nonlabored ventilation, respiratory function stable and patient connected to nasal cannula oxygen Cardiovascular status: blood pressure returned to baseline and stable Postop Assessment: no apparent nausea or vomiting Anesthetic complications: no    Last Vitals:  Vitals:   06/22/18 1000 06/22/18 1015  BP: 136/72 136/69  Pulse: 71 68  Resp: 19 (!) 27  Temp:  36.4 C  SpO2: 97% 95%    Last Pain:  Vitals:   06/22/18 1145  TempSrc:   PainSc: 0-No pain                 Halford Goetzke

## 2018-06-22 NOTE — Transfer of Care (Signed)
Immediate Anesthesia Transfer of Care Note  Patient: [place]  Procedure(s) Performed: LAPAROSCOPIC BILATERAL INGUINAL HERNIA REPAIR (N/A Inguinal) INSERTION OF MESH (N/A )  Patient Location: PACU  Anesthesia Type:General  Level of Consciousness: awake, alert , oriented and patient cooperative  Airway & Oxygen Therapy: Patient Spontanous Breathing and Patient connected to nasal cannula oxygen  Post-op Assessment: Report given to RN and Post -op Vital signs reviewed and stable  Post vital signs: Reviewed and stable  Last Vitals:  Vitals Value Taken Time  BP 142/74 06/22/2018  9:40 AM  Temp    Pulse 73 06/22/2018  9:44 AM  Resp 15 06/22/2018  9:44 AM  SpO2 100 % 06/22/2018  9:44 AM  Vitals shown include unvalidated device data.  Last Pain:  Vitals:   06/22/18 0542  TempSrc: Oral         Complications: No apparent anesthesia complications

## 2018-06-22 NOTE — Op Note (Signed)
06/22/2018  9:22 AM  PATIENT:  [place]  77 y.o. male  Patient Care Team: [name], MD as PCP - Huston Foley, MD as Consulting Physician (General Surgery) Juanita Craver, MD as Consulting Physician (Gastroenterology) Dorna Leitz, MD as Consulting Physician (Orthopedic Surgery)  PRE-OPERATIVE DIAGNOSIS:  Right and possible left inguinal hernias  POST-OPERATIVE DIAGNOSIS:  BILATERAL INGUINAL HERNIAS  PROCEDURE:   LAPAROSCOPIC BILATERAL INGUINAL HERNIA REPAIR INSERTION OF MESH  SURGEON:  [name], MD  ASSISTANT: None  ANESTHESIA:     Regional ilioinguinal and genitofemoral and spermatic cord nerve blocks  General  EBL:  Total I/O In: -  Out: 25 [Blood:25].  See anesthesia record  Delay start of Pharmacological VTE agent (>24hrs) due to surgical blood loss or risk of bleeding:  no  DRAINS: NONE  SPECIMEN:  NONE  DISPOSITION OF SPECIMEN:  N/A  COUNTS:  YES  PLAN OF CARE: Discharge to home after PACU  PATIENT DISPOSITION:  PACU - hemodynamically stable.  INDICATION:   The anatomy & physiology of the abdominal wall and pelvic floor was discussed.  The pathophysiology of hernias in the inguinal and pelvic region was discussed.  Natural history risks such as progressive enlargement, pain, incarceration & strangulation was discussed.   Contributors to complications such as smoking, obesity, diabetes, prior surgery, etc were discussed.    I feel the risks of no intervention will lead to serious problems that outweigh the operative risks; therefore, I recommended surgery to reduce and repair the hernia.  I explained laparoscopic techniques with possible need for an open approach.  I noted usual use of mesh to patch and/or buttress hernia repair  Risks such as bleeding, infection, abscess, need for further treatment, heart attack, death, and other risks were discussed.  I noted a good likelihood this will help address the problem.   Goals of  post-operative recovery were discussed as well.  Possibility that this will not correct all symptoms was explained.  I stressed the importance of low-impact activity, aggressive pain control, avoiding constipation, & not pushing through pain to minimize risk of post-operative chronic pain or injury. Possibility of reherniation was discussed.  We will work to minimize complications.     An educational handout further explaining the pathology & treatment options was given as well.  Questions were answered.  The patient expresses understanding & wishes to proceed with surgery.  OR FINDINGS: On the right side pantaloon type direct and indirect inguinal hernias.  Rather large.  No femoral nor obturator hernias.  In the left side small direct space hernia.  No indirect femoral nor obturator hernia.  DESCRIPTION:  The patient was identified & brought into the operating room. The patient was positioned supine with arms tucked. SCDs were active during the entire case. The patient underwent general anesthesia without any difficulty.  The abdomen was prepped and draped in a sterile fashion. The patient's bladder was emptied.  A Surgical Timeout confirmed our plan.  I made a transverse incision through the inferior umbilical fold.  I made a small transverse nick through the anterior rectus fascia contralateral to the inguinal hernia side and placed a 0-vicryl stitch through the fascia.  I placed a Hasson trocar into the preperitoneal plane.  Entry was clean.  We induced carbon dioxide insufflation. Camera inspection revealed no injury.  I used a 21mm angled scope to bluntly free the peritoneum off the infraumbilical anterior abdominal wall.  I created enough of a preperitoneal pocket to place  1mm ports into the right & left mid-abdomen into this preperitoneal cavity.  I focused attention on the RIGHT pelvis since that was the dominant hernia side.   I used blunt & focused sharp dissection to free the peritoneum  off the flank and down to the pubic rim.  I freed the anteriolateral bladder wall off the anteriolateral pelvic wall, sparing midline attachments.   I located a swath of peritoneum going into a hernia fascial defect at the  direct space consistent with  a direct space inguinal hernia.  Also a moderate indirect hernia as well, a classic pantaloon type double inguinal hernia.  I gradually freed the peritoneal hernia sac off safely and reduced it into the preperitoneal space.  I freed the peritoneum off the spermatic vessels & vas deferens.  I freed peritoneum off the retroperitoneum along the psoas muscle.  Spermatic cord lipoma was dissected away & removed.  I checked & assured hemostasis.  As anticipated, surgical dissection was challenged by dense adhesions and poor planes resulting in the need for careful repair of the resulting in a few small peritoneal hernia sac defects.  Repair was done with  minimally invasive intracorporeal suturing using 2-0 vicryl absorbable suture    I turned attention on the opposite  LEFT pelvis.  I did dissection in a similar, mirror-image fashion. The patient had a direct space inguinal hernia.Marland Kitchen   Spermatic cord lipoma was dissected away & removed.    I checked & assured hemostasis.     I chose 15x15 cm sheets mesh.  I chose a more standard dense Bard medium weight polypropylene mesh for the right side given the larger defects.  On the smaller hernia on the left side I used ultra-lightweight polypropylene mesh (Ultrapro).  I cut a single sigmoid-shaped slit ~6cm from a corner of each mesh.  I placed the meshes into the preperitoneal space & laid them as overlapping diamonds such that at the inferior points, a 6x6 cm corner flap rested in the true anterolateral pelvis, covering the obturator & femoral foramina.   I allowed the bladder to return to the pubis, this helping tuck the corners of the mesh in the anteriolateral pelvis.  The medial corners overlapped each other across  midline cephalad to the pubic rim.   Given the numerous hernias of moderate size, I placed a third 15x15cm medium weight Bard mesh mesh in the center as a vertical diamond.  The lateral wings of the mesh overlap across the direct spaces and internal rings where the dominant hernias were.  This provided good coverage and reinforcement of the hernia repairs.  Because of the central mesh placement with good overlap, I did not place any tacks.   I held the hernia sacs cephalad & evacuated carbon dioxide.  I closed the fascia with absorbable suture.  I closed the skin using 4-0 monocryl stitch.  Sterile dressings were applied.   The patient was extubated & arrived in the PACU in stable condition..  I had discussed postoperative care with the patient in the holding area.  Instructions are written in the chart.  I discussed operative findings, updated the patient's status, discussed probable steps to recovery, and gave postoperative recommendations to the patient's spouse.  Recommendations were made.  Questions were answered.  She expressed understanding & appreciation.   [name], M.D., F.A.C.S. Gastrointestinal and Minimally Invasive Surgery [facility] [address] [place], [place] [address] [phone] Main / Paging  06/22/2018 9:22  AM

## 2018-06-22 NOTE — Interval H&P Note (Signed)
History and Physical Interval Note:  06/22/2018 7:38 AM  [place]  has presented today for surgery, with the diagnosis of Right and possible left inguinal hernia  The various methods of treatment have been discussed with the patient and family. After consideration of risks, benefits and other options for treatment, the patient has consented to  Procedure(s): LAPAROSCOPIC RIGHT AND POSSIBLE LEFT INGUINAL HERNIA (N/A) INSERTION OF MESH (N/A) as a surgical intervention .  The patient's history has been reviewed, patient examined, no change in status, stable for surgery.  I have reviewed the patient's chart and labs.  Questions were answered to the patient's satisfaction.    I have re-reviewed the the patient's records, history, medications, and allergies.  I have re-examined the patient.  I again discussed intraoperative plans and goals of post-operative recovery.  The patient agrees to proceed.  H[place] Lewis  May 26, 1941 [id number]  Patient Care Team: [name], MD as PCP - Huston Foley, MD as Consulting Physician (General Surgery) Juanita Craver, MD as Consulting Physician (Gastroenterology) Dorna Leitz, MD as Consulting Physician (Orthopedic Surgery)  Patient Active Problem List   Diagnosis Date Noted  . Right inguinal hernia 05/02/2018  . History of urinary retention 05/02/2018  . History of adenomatous polyp of colon 01/24/2017  . Primary osteoarthritis of right knee 02/16/2016  . Right knee pain 08/03/2013  . GERD 12/29/2010  . 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  . METATARSALGIA 01/27/2010  . VIRAL INFECTION 01/23/2010  . HYPERLIPIDEMIA 05/30/2009  . MEDIAL MENISCUS TEAR, RIGHT 10/17/2008  . KNEE PAIN, RIGHT 09/16/2008  . BUNION, LEFT FOOT 09/16/2008  . 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    Past Medical History:  Diagnosis Date  . Arthritis    history in knee prior to surgery  . BUNION, LEFT FOOT 09/16/2008  . 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  Complication of anesthesia    had urinary retention after surgery cause elevated heart rate and severe abd pain once catheterized symptoms resolved  . GERD 12/29/2010  . History of colon polyps   . HYPERLIPIDEMIA 05/30/2009  . Hyperthyroidism 1980's   resolved now, took medicine at the time  . MEDIAL MENISCUS TEAR, RIGHT 10/17/2008  . METATARSALGIA 01/27/2010  . 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  . 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 White coat syndrome with hypertension    no medications    Past Surgical History:  Procedure Laterality Date  . APPENDECTOMY     done with colectomy  . COLON SURGERY  2008   precancerous polyps  . COLONOSCOPY  05/2018  . [place]  2012  . KNEE ARTHROSCOPY Right   . PARTIAL COLECTOMY  2008  . TONSILLECTOMY    . 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 Marital status: Married    Spouse name: Not on file  . Number of children: Not on file  . Years of education: Not on file  . Highest education level: Not on file  Occupational History  . Not on file  Social Needs  . Financial resource strain: Not on file  . Food insecurity:    Worry: Not on file    Inability: Not on file  . Transportation needs:    Medical: Not on file    Non-medical: Not on file  Tobacco Use  .  Smoking status: Former Smoker    Packs/day: 1.00    Years: 6.00    Pack years: 6.00    Types: Cigarettes    Last attempt to quit: 06/09/1968    Years since quitting: 50.0  . Smokeless tobacco: Never Used  . Tobacco comment: quit at 77 yo   Substance and Sexual Activity  . Alcohol use: Yes    Comment: 2 glasses per night weekends only  . Drug use: No  . Sexual activity: Not on file  Lifestyle  . Physical activity:    Days per week: Not on file    Minutes per session: Not on file  . Stress: Not on file   Relationships  . Social connections:    Talks on phone: Not on file    Gets together: Not on file    Attends religious service: Not on file    Active member of club or organization: Not on file    Attends meetings of clubs or organizations: Not on file    Relationship status: Not on file  . Intimate partner violence:    Fear of current or ex partner: Not on file    Emotionally abused: Not on file    Physically abused: Not on file    Forced sexual activity: Not on file  Other Topics Concern  . Not on file  Social History Narrative  . Not on file    Family History  Problem Relation Age of Onset  . Cancer Father        colon, prostate  . Hyperlipidemia Mother     Medications Prior to Admission  Medication Sig Dispense Refill Last Dose  . atorvastatin (LIPITOR) 20 MG tablet TAKE 1 TABLET BY MOUTH EVERY OTHER DAY 45 tablet 3 06/21/2018 at Unknown time  . ibuprofen (ADVIL,MOTRIN) 200 MG tablet Take 400 mg by mouth daily as needed for moderate pain.   Past Month at Unknown time  . Multiple Vitamin (MULTIVITAMIN) capsule Take 1 capsule by mouth daily.   06/21/2018 at Unknown time  . Omega-3 Fatty Acids (FISH OIL) 1200 MG CAPS Take 1,200 mg by mouth 2 (two) times daily.   06/21/2018 at Unknown time  . pantoprazole (PROTONIX) 40 MG tablet TAKE 1 TABLET BY MOUTH EVERY DAY 90 tablet 3 06/21/2018 at Unknown time  . Polyvinyl Alcohol-Povidone (REFRESH OP) Place 1 drop into both eyes daily as needed (dry eyes).   06/22/2018 at 0300  . Probiotic Product (ULTRAFLORA IMMUNE HEALTH PO) Take 1 capsule by mouth daily.    Past Week at Unknown time  . tamsulosin (FLOMAX) 0.4 MG CAPS capsule Take 0.4 mg by mouth daily.  2 06/21/2018 at Unknown time    Current Facility-Administered Medications  Medication Dose Route Frequency Provider Last Rate Last Dose  . ceFAZolin (ANCEF) IVPB 2g/100 mL premix  2 g Intravenous On Call to OR Michael Boston, MD      . Chlorhexidine Gluconate Cloth 2 % PADS 6 each  6 each  Topical Once Michael Boston, MD       And  . Chlorhexidine Gluconate Cloth 2 % PADS 6 each  6 each Topical Once Michael Boston, MD      . lactated ringers infusion   Intravenous Continuous Janeece Riggers, MD 50 mL/hr at 06/22/18 0604       Allergies  Allergen Reactions  . Codeine Sulfate Itching and Rash    BP (!) 153/88   Pulse 72   Temp 97.7 F (36.5 C) (Oral)  Resp 18   Ht 5\' 11"  (1.803 m)   Wt 85.7 kg (189 lb)   SpO2 97%   BMI 26.36 kg/m   Labs: No results found for this or any previous visit (from the past 48 hour(s)).  Imaging / Studies: No results found.   [name], M.D., F.A.C.S. Gastrointestinal and Minimally Invasive Surgery [facility] [address] [place], [place] [address] [phone] Main / Paging  06/22/2018 7:39 AM    [name]

## 2018-06-22 NOTE — H&P (Signed)
Hector Lewis DOB: September 05, 1941  Patient Care Team: [name], MD as PCP - Huston Foley, MD as Consulting Physician (General Surgery) Juanita Craver, MD as Consulting Physician (Gastroenterology) Dorna Leitz, MD as Consulting Physician (Orthopedic Surgery)  ` Patient sent for surgical consultation at the request of Dr. Carolann Littler  Chief Complaint: Right groin swelling. Probable hernia.  The patient is a pleasant active male who noticed right groin bulging in the shower. He thinks it's has gotten a bit larger. Occasionally uncomfortable and sensitive but no episodes of severe sharp pain. He mentioned it to his primary care physician. Inguinal hernia diagnosed. Surgical consultation offered. Patient has some sensitivity in the left groin but no real pain or swelling there. He gets up to urinate about twice at night. He did have an episode of urinary retention after his knee replacement a few years ago by Dr. Berenice Primas. Required in and out catheterization. Usually moves his bowels about 2 or 3 times a day. He does not smoke. He had a colectomy for a unresectable polyp in 2008 by my partner, Dr. Zella Richer, who has since retired. No other abdominal surgeries. Patient denies any history of infections. He goes to the gym at least 3 times a week and enjoys playing pickle ball. 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 Otherwise all other 12 point ROS are negative) ` ` `   Past Surgical History Mammie Lorenzo, LPN; 3/50/0938 1:82 AM) Colon Polyp Removal - Colonoscopy Colon Removal - Partial Foot Surgery Right. Knee Surgery Right.  Diagnostic Studies History Mammie Lorenzo, LPN; 9/93/7169 6:78 AM) Colonoscopy 1-5 years ago  Allergies Mammie Lorenzo, LPN; 9/38/1017 5:10 AM) Codeine and Related Rash,  Itching.  Medication History Mammie Lorenzo, LPN; 2/58/5277 8:24 AM) Atorvastatin Calcium (20MG  Tablet, Oral) Active. Pantoprazole Sodium (40MG  Tablet DR, Oral) Active. Fish Oil (1000MG  Capsule, Oral) Active. Probiotic (Oral) Active. Medications Reconciled  Social History Mammie Lorenzo, LPN; 2/35/3614 4:31 AM) Alcohol use Moderate alcohol use. Caffeine use Coffee. No drug use Tobacco use Former smoker.  Family History Mammie Lorenzo, LPN; 5/40/0867 6:19 AM) Colon Cancer Father. Hypertension Mother. Prostate Cancer Father.  Other Problems Mammie Lorenzo, LPN; 04/20/3266 1:24 AM) Back Pain Gastroesophageal Reflux Disease Hypercholesterolemia Thyroid Disease     Review of Systems Claiborne Billings Dockery LPN; 5/80/9983 3:82 AM) General Not Present- Appetite Loss, Chills, Fatigue, Fever, Night Sweats, Weight Gain and Weight Loss. Skin Not Present- Change in Wart/Mole, Dryness, Hives, Jaundice, New Lesions, Non-Healing Wounds, Rash and Ulcer. HEENT Present- Seasonal Allergies and Wears glasses/contact lenses. Not Present- Earache, Hearing Loss, Hoarseness, Nose Bleed, Oral Ulcers, Ringing in the Ears, Sinus Pain, Sore Throat, Visual Disturbances and Yellow Eyes. Respiratory Not Present- Bloody sputum, Chronic Cough, Difficulty Breathing, Snoring and Wheezing. Breast Not Present- Breast Mass, Breast Pain, Nipple Discharge and Skin Changes. Cardiovascular Not Present- Chest Pain, Difficulty Breathing Lying Down, Leg Cramps, Palpitations, Rapid Heart Rate, Shortness of Breath and Swelling of Extremities. Gastrointestinal Not Present- Abdominal Pain, Bloating, Bloody Stool, Change in Bowel Habits, Chronic diarrhea, Constipation, Difficulty Swallowing, Excessive gas, Gets full quickly at meals, Hemorrhoids, Indigestion, Nausea, Rectal Pain and Vomiting. Male Genitourinary Not Present- Blood in Urine, Change in Urinary Stream, Frequency, Impotence, Nocturia, Painful Urination,  Urgency and Urine Leakage. Musculoskeletal Not Present- Back Pain, Joint Pain, Joint Stiffness, Muscle Pain, Muscle Weakness and Swelling of Extremities. Neurological  Not Present- Decreased Memory, Fainting, Headaches, Numbness, Seizures, Tingling, Tremor, Trouble walking and Weakness. Psychiatric Not Present- Anxiety, Bipolar, Change in Sleep Pattern, Depression, Fearful and Frequent crying. Endocrine Not Present- Cold Intolerance, Excessive Hunger, Hair Changes, Heat Intolerance, Hot flashes and New Diabetes. Hematology Not Present- Blood Thinners, Easy Bruising, Excessive bleeding, Gland problems, HIV and Persistent Infections.  Vitals Claiborne Billings Dockery LPN; 02/03/9797 9:21 AM) 05/02/2018 9:31 AM Weight: 184.8 lb Height: 70.5in Body Surface Area: 2.03 m Body Mass Index: 26.14 kg/m  Temp.: 97.72F(Temporal)  Pulse: 92 (Regular)  BP: 142/84 (Sitting, Left Arm, Standard)    BP (!) 153/88   Pulse 72   Temp 97.7 F (36.5 C) (Oral)   Resp 18   Ht 5\' 11"  (1.803 m)   Wt 85.7 kg (189 lb)   SpO2 97%   BMI 26.36 kg/m    Physical Exam [name] MD; 05/02/2018 9:57 AM)  General Mental Status-Alert. General Appearance-Not in acute distress, Not Sickly. Orientation-Oriented X3. Hydration-Well hydrated. Voice-Normal.  Integumentary Global Assessment Upon inspection and palpation of skin surfaces of the - Axillae: non-tender, no inflammation or ulceration, no drainage. and Distribution of scalp and body hair is normal. General Characteristics Temperature - normal warmth is noted.  Head and Neck Head-normocephalic, atraumatic with no lesions or palpable masses. Face Global Assessment - atraumatic, no absence of expression. Neck Global Assessment - no abnormal movements, no bruit auscultated on the right, no bruit auscultated on the left, no decreased range of motion, non-tender. Trachea-midline. Thyroid Gland Characteristics -  non-tender.  Eye Eyeball - Left-Extraocular movements intact, No Nystagmus. Eyeball - Right-Extraocular movements intact, No Nystagmus. Cornea - Left-No Hazy. Cornea - Right-No Hazy. Sclera/Conjunctiva - Left-No scleral icterus, No Discharge. Sclera/Conjunctiva - Right-No scleral icterus, No Discharge. Pupil - Left-Direct reaction to light normal. Pupil - Right-Direct reaction to light normal.  ENMT Ears Pinna - Left - no drainage observed, no generalized tenderness observed. Right - no drainage observed, no generalized tenderness observed. Nose and Sinuses External Inspection of the Nose - no destructive lesion observed. Inspection of the nares - Left - quiet respiration. Right - quiet respiration. Mouth and Throat Lips - Upper Lip - no fissures observed, no pallor noted. Lower Lip - no fissures observed, no pallor noted. Nasopharynx - no discharge present. Oral Cavity/Oropharynx - Tongue - no dryness observed. Oral Mucosa - no cyanosis observed. Hypopharynx - no evidence of airway distress observed.  Chest and Lung Exam Inspection Movements - Normal and Symmetrical. Accessory muscles - No use of accessory muscles in breathing. Palpation Palpation of the chest reveals - Non-tender. Auscultation Breath sounds - Normal and Clear.  Cardiovascular Auscultation Rhythm - Regular. Murmurs & Other Heart Sounds - Auscultation of the heart reveals - No Murmurs and No Systolic Clicks.  Abdomen Inspection Inspection of the abdomen reveals - No Visible peristalsis and No Abnormal pulsations. Umbilicus - No Bleeding, No Urine drainage. Palpation/Percussion Palpation and Percussion of the abdomen reveal - Soft, Non Tender, No Rebound tenderness, No Rigidity (guarding) and No Cutaneous hyperesthesia. Note: Abdomen soft. Nontender. Not distended. No umbilical or incisional hernias. No guarding.  Male Genitourinary Sexual Maturity Tanner 5 - Adult hair pattern and  Adult penile size and shape. Note: ` ` ` Obvious right groin bulging moderate size. Sensitive but reducible. Consistent with moderate size RIGHT inguinal hernia. Nothing definite on the left side but sensitive at the external ring  Peripheral Vascular Upper Extremity Inspection - Left - No Cyanotic nailbeds, Not Ischemic. Right - No  Cyanotic nailbeds, Not Ischemic.  Neurologic Neurologic evaluation reveals -normal attention span and ability to concentrate, able to name objects and repeat phrases. Appropriate fund of knowledge , normal sensation and normal coordination. Mental Status Affect - not angry, not paranoid. Cranial Nerves-Normal Bilaterally. Gait-Normal.  Neuropsychiatric Mental status exam performed with findings of-able to articulate well with normal speech/language, rate, volume and coherence, thought content normal with ability to perform basic computations and apply abstract reasoning and no evidence of hallucinations, delusions, obsessions or homicidal/suicidal ideation.  Musculoskeletal Global Assessment Spine, Ribs and Pelvis - no instability, subluxation or laxity. Right Upper Extremity - no instability, subluxation or laxity.  Lymphatic Head & Neck  General Head & Neck Lymphatics: Bilateral - Description - No Localized lymphadenopathy. Axillary  General Axillary Region: Bilateral - Description - No Localized lymphadenopathy. Femoral & Inguinal  Generalized Femoral & Inguinal Lymphatics: Left - Description - No Localized lymphadenopathy. Right - Description - No Localized lymphadenopathy.    Assessment & Plan   RIGHT INGUINAL HERNIA (K40.90) Impression: Pleasant active retired male with moderate size right inguinal hernia. Some sensitivity left groin as well.  I think he would benefit from hernia repair. Laparoscopic approach.  He did have an episode of urinary retention after his knee replacement. Therefore, we'll place him on  Flomax one week preop & then one week postoperatively to hopefully minimize the risk of that.   The anatomy & physiology of the abdominal wall and pelvic floor was discussed. The pathophysiology of hernias in the inguinal and pelvic region was discussed. Natural history risks such as progressive enlargement, pain, incarceration, and strangulation was discussed. Contributors to complications such as smoking, obesity, diabetes, prior surgery, etc were discussed.  I feel the risks of no intervention will lead to serious problems that outweigh the operative risks; therefore, I recommended surgery to reduce and repair the hernia. I explained laparoscopic techniques with possible need for an open approach. I noted usual use of mesh to patch and/or buttress hernia repair  Risks such as bleeding, infection, abscess, need for further treatment, heart attack, death, and other risks were discussed. I noted a good likelihood this will help address the problem. Goals of post-operative recovery were discussed as well. Possibility that this will not correct all symptoms was explained. I stressed the importance of low-impact activity, aggressive pain control, avoiding constipation, & not pushing through pain to minimize risk of post-operative chronic pain or injury. Possibility of reherniation was discussed. We will work to minimize complications.  An educational handout further explaining the pathology & treatment options was given as well. Questions were answered. The patient expresses understanding & wishes to proceed with surgery.  HISTORY OF URINARY RETENTION (L39.030) Impression: Given his history of urinary retention, we'll place him on perioperative Flomax to hopefully minimize the risk this time around.  Current Plans Started Flomax 0.4 MG Oral Capsule, 1 (one) Capsule daily, #14, 14 days starting 05/02/2018, Ref. x2. Local Order: to shrink the prostate and help with  urination  [name], M.D., F.A.C.S. Gastrointestinal and Minimally Invasive Surgery [facility] [address] [place], [place] [address] [phone] Main / Paging

## 2018-06-23 ENCOUNTER — Encounter (HOSPITAL_COMMUNITY): Payer: Self-pay | Admitting: Surgery

## 2018-12-12 ENCOUNTER — Other Ambulatory Visit: Payer: Self-pay | Admitting: Family Medicine

## 2019-02-02 ENCOUNTER — Encounter: Payer: Medicare Other | Admitting: Family Medicine

## 2019-02-02 ENCOUNTER — Encounter: Payer: Self-pay | Admitting: Family Medicine

## 2019-02-02 ENCOUNTER — Ambulatory Visit: Payer: Medicare Other | Admitting: Family Medicine

## 2019-02-02 ENCOUNTER — Other Ambulatory Visit: Payer: Self-pay

## 2019-02-02 VITALS — BP 130/84 | HR 77 | Temp 98.3°F | Ht 69.0 in | Wt 189.9 lb

## 2019-02-02 DIAGNOSIS — R059 Cough, unspecified: Secondary | ICD-10-CM

## 2019-02-02 DIAGNOSIS — R05 Cough: Secondary | ICD-10-CM | POA: Diagnosis not present

## 2019-02-02 DIAGNOSIS — F419 Anxiety disorder, unspecified: Secondary | ICD-10-CM | POA: Diagnosis not present

## 2019-02-02 MED ORDER — BENZONATATE 100 MG PO CAPS: ORAL_CAPSULE | ORAL | 0 refills | Status: DC

## 2019-02-02 NOTE — Progress Notes (Signed)
Subjective:     Patient ID: Hector Lewis, male   DOB: 1941-10-01, 78 y.o.   MRN: [id number]  HPI Patient is seen for the following issues  Cough.  Onset about 10 days ago.  He states his wife was diagnosed with influenza.  He states he had 1 day of fever chills but none since.  His cough is mostly dry.  Worse with sitting and supine and periods of inactivity.  Fairly severe at times and not greatly relieved with cough drops.  He still plays pickle ball and has not had any problems with activity.  No wheezing.  No dyspnea.  No hemoptysis.  No pleuritic pain.  Generally feels well overall.  Getting ready to travel to [place].  Second issue is some increased anxiety symptoms.  Onset several weeks ago.  No specific triggering factors.  He states he has had some anxiety frequently waking up in the middle the night or with specific situations like when having guests over.  He states this is not something he has had to deal with much in the past.  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  He is tried some tai chi and yoga type breathing.  He denies depression symptoms.  No tremors, weight loss, diarrhea, tachycardia.    Past Medical History:  Diagnosis Date  . Arthritis    history in knee prior to surgery  . BUNION, LEFT FOOT 09/16/2008  . 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 Complication of anesthesia    had urinary retention after surgery cause elevated heart rate and severe abd pain once catheterized symptoms resolved  . GERD 12/29/2010  . History of colon polyps   . HYPERLIPIDEMIA 05/30/2009  . Hyperthyroidism 1980's   resolved now, took medicine at the time  . MEDIAL MENISCUS TEAR, RIGHT 10/17/2008  . METATARSALGIA 01/27/2010  . 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  . 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 White coat syndrome with hypertension    no medications   Past Surgical History:  Procedure Laterality Date  . APPENDECTOMY     done with colectomy  . COLON  SURGERY  2008   precancerous polyps  . COLONOSCOPY  05/2018  . [place]  2012  . INGUINAL HERNIA REPAIR N/A 06/22/2018   Procedure: LAPAROSCOPIC BILATERAL INGUINAL HERNIA REPAIR;  Surgeon: Michael Boston, MD;  Location: [facility];  Service: General;  Laterality: N/A;  . INSERTION OF MESH N/A 06/22/2018   Procedure: INSERTION OF MESH;  Surgeon: Michael Boston, MD;  Location: [facility];  Service: General;  Laterality: N/A;  . KNEE ARTHROSCOPY Right   . PARTIAL COLECTOMY  2008  . TONSILLECTOMY    . 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   Procedure: TOTAL KNEE ARTHROPLASTY;  Surgeon: Dorna Leitz, MD;  Location: [facility];  Service: Orthopedics;  Laterality: Right;    reports that he quit smoking about 50 years ago. His smoking use included cigarettes. He has a 6.00 pack-year smoking history. He has never used smokeless tobacco. He reports current alcohol use. He reports that he does not use drugs. family history includes Cancer in his father; Hyperlipidemia in his mother. Allergies  Allergen Reactions  . Codeine Sulfate Itching and Rash     Review of Systems  Constitutional: Negative for appetite change, chills, fever and unexpected weight change.  HENT: Negative for sinus pressure and sinus pain.   Respiratory: Positive for cough. Negative for shortness of breath and wheezing.  Cardiovascular: Negative for chest pain, palpitations and leg swelling.  Gastrointestinal: Negative for abdominal pain.  Psychiatric/Behavioral: Negative for agitation, confusion and dysphoric mood. The patient is nervous/anxious.        Objective:   Physical Exam Constitutional:      Appearance: He is well-developed.  HENT:     Right Ear: External ear normal.     Left Ear: External ear normal.  Eyes:     Pupils: Pupils are equal, round, and reactive to light.  Neck:     Musculoskeletal: Neck supple.     Thyroid: No thyromegaly.  Cardiovascular:     Rate and Rhythm: Normal rate and regular rhythm.   Pulmonary:     Effort: Pulmonary effort is normal. No respiratory distress.     Breath sounds: Normal breath sounds. No wheezing or rales.  Neurological:     Mental Status: He is alert and oriented to person, place, and time.  Psychiatric:        Mood and Affect: Mood normal.        Thought Content: Thought content normal.        Assessment:     #1 cough.  Suspect post viral.  Nonfocal exam.  No red flags such as fever, dyspnea, hypoxemia.  #2 nonspecific anxiety.  Etiology unclear.   No hx of specific anxiety disorder.    Plan:     -Discussed nonpharmacologic ways of trying to manage anxiety.  We gave him option of counseling but he declines this time. -Consider low-dose SSRI if symptoms persist -Regarding cough, benzonatate 100 mg 1-2 every 8 hours as needed for cough -Follow-up promptly for any fever, increased shortness of breath, or other concern  [name] MD [facility]

## 2019-02-02 NOTE — Patient Instructions (Signed)
Cough, Adult  Coughing is a reflex that clears your throat and your airways. Coughing helps to heal and protect your lungs. It is normal to cough occasionally, but a cough that happens with other symptoms or lasts a long time may be a sign of a condition that needs treatment. A cough may last only 2-3 weeks (acute), or it may last longer than 8 weeks (chronic). What are the causes? Coughing is commonly caused by:  Breathing in substances that irritate your lungs.  A viral or bacterial respiratory infection.  Allergies.  Asthma.  Postnasal drip.  Smoking.  Acid backing up from the stomach into the esophagus (gastroesophageal reflux).  Certain medicines.  Chronic lung problems, including COPD (or rarely, lung cancer).  Other medical conditions such as heart failure. Follow these instructions at home: Pay attention to any changes in your symptoms. Take these actions to help with your discomfort:  Take medicines only as told by your health care provider. ? If you were prescribed an antibiotic medicine, take it as told by your health care provider. Do not stop taking the antibiotic even if you start to feel better. ? Talk with your health care provider before you take a cough suppressant medicine.  Drink enough fluid to keep your urine clear or pale yellow.  If the air is dry, use a cold steam vaporizer or humidifier in your bedroom or your home to help loosen secretions.  Avoid anything that causes you to cough at work or at home.  If your cough is worse at night, try sleeping in a semi-upright position.  Avoid cigarette smoke. If you smoke, quit smoking. If you need help quitting, ask your health care provider.  Avoid caffeine.  Avoid alcohol.  Rest as needed. Contact a health care provider if:  You have new symptoms.  You cough up pus.  Your cough does not get better after 2-3 weeks, or your cough gets worse.  You cannot control your cough with suppressant  medicines and you are losing sleep.  You develop pain that is getting worse or pain that is not controlled with pain medicines.  You have a fever.  You have unexplained weight loss.  You have night sweats. Get help right away if:  You cough up blood.  You have difficulty breathing.  Your heartbeat is very fast. This information is not intended to replace advice given to you by your health care provider. Make sure you discuss any questions you have with your health care provider. Document Released: 05/28/2011 Document Revised: 05/06/2016 Document Reviewed: 02/05/2015 Elsevier Interactive Patient Education  2019 Elsevier Inc.  

## 2019-02-07 ENCOUNTER — Encounter: Payer: Self-pay | Admitting: Family Medicine

## 2019-02-07 ENCOUNTER — Other Ambulatory Visit: Payer: Self-pay

## 2019-02-07 ENCOUNTER — Ambulatory Visit (INDEPENDENT_AMBULATORY_CARE_PROVIDER_SITE_OTHER): Payer: Medicare Other | Admitting: Family Medicine

## 2019-02-07 VITALS — BP 130/84 | HR 58 | Temp 97.7°F | Ht 69.0 in | Wt 191.0 lb

## 2019-02-07 DIAGNOSIS — Z0000 Encounter for general adult medical examination without abnormal findings: Secondary | ICD-10-CM

## 2019-02-07 LAB — CBC WITH DIFFERENTIAL/PLATELET
Basophils Absolute: 0.1 10*3/uL (ref 0.0–0.1)
Basophils Relative: 1 % (ref 0.0–3.0)
Eosinophils Absolute: 0.4 10*3/uL (ref 0.0–0.7)
Eosinophils Relative: 6.1 % — ABNORMAL HIGH (ref 0.0–5.0)
HCT: 44.2 % (ref 39.0–52.0)
Hemoglobin: 15.2 g/dL (ref 13.0–17.0)
Lymphocytes Relative: 23.8 % (ref 12.0–46.0)
Lymphs Abs: 1.6 10*3/uL (ref 0.7–4.0)
MCHC: 34.4 g/dL (ref 30.0–36.0)
MCV: 92.4 fl (ref 78.0–100.0)
Monocytes Absolute: 0.6 10*3/uL (ref 0.1–1.0)
Monocytes Relative: 9 % (ref 3.0–12.0)
Neutro Abs: 4.1 10*3/uL (ref 1.4–7.7)
Neutrophils Relative %: 60.1 % (ref 43.0–77.0)
Platelets: 231 10*3/uL (ref 150.0–400.0)
RBC: 4.78 Mil/uL (ref 4.22–5.81)
RDW: 13.5 % (ref 11.5–15.5)
WBC: 6.9 10*3/uL (ref 4.0–10.5)

## 2019-02-07 LAB — LIPID PANEL
Cholesterol: 194 mg/dL (ref 0–200)
HDL: 53.7 mg/dL (ref >39.00)
LDL Cholesterol: 110 mg/dL — ABNORMAL HIGH (ref 0–99)
NonHDL: 140.06
Total CHOL/HDL Ratio: 4
Triglycerides: 151 mg/dL — ABNORMAL HIGH (ref 0.0–149.0)
VLDL: 30.2 mg/dL (ref 0.0–40.0)

## 2019-02-07 LAB — BASIC METABOLIC PANEL
BUN: 18 mg/dL (ref 6–23)
CO2: 28 mEq/L (ref 19–32)
Calcium: 9.2 mg/dL (ref 8.4–10.5)
Chloride: 103 mEq/L (ref 96–112)
Creatinine, Ser: 0.88 mg/dL (ref 0.40–1.50)
GFR: 84 mL/min (ref >60.00)
Glucose, Bld: 85 mg/dL (ref 70–99)
Potassium: 4.3 mEq/L (ref 3.5–5.1)
Sodium: 139 mEq/L (ref 135–145)

## 2019-02-07 LAB — HEPATIC FUNCTION PANEL
ALT: 24 U/L (ref 0–53)
AST: 26 U/L (ref 0–37)
Albumin: 4.4 g/dL (ref 3.5–5.2)
Alkaline Phosphatase: 73 U/L (ref 39–117)
Bilirubin, Direct: 0.1 mg/dL (ref 0.0–0.3)
Total Bilirubin: 0.8 mg/dL (ref 0.2–1.2)
Total Protein: 7 g/dL (ref 6.0–8.3)

## 2019-02-07 MED ORDER — SERTRALINE HCL 50 MG PO TABS: 50.0000 mg | ORAL_TABLET | Freq: Every day | ORAL | 3 refills | Status: DC

## 2019-02-07 NOTE — Progress Notes (Signed)
Subjective:     Patient ID: Hector Lewis, male   DOB: 1941-05-26, 78 y.o.   MRN: [id number]  HPI Patient seen for complete physical.  Generally doing well with exception of some recent anxiety symptoms as per prior note.  He cannot put a finger on any specific stressors.  He is still exercising regularly.  He would like to consider possible low-dose SSRI.  Health maintenance reviewed.  His immunizations are up-to-date.  Colonoscopy up-to-date.  Has had both shingles vaccines.  No recent falls.  Denies any major depression symptoms.  Past Medical History:  Diagnosis Date  . Arthritis    history in knee prior to surgery  . BUNION, LEFT FOOT 09/16/2008  . 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 Complication of anesthesia    had urinary retention after surgery cause elevated heart rate and severe abd pain once catheterized symptoms resolved  . GERD 12/29/2010  . History of colon polyps   . HYPERLIPIDEMIA 05/30/2009  . Hyperthyroidism 1980's   resolved now, took medicine at the time  . MEDIAL MENISCUS TEAR, RIGHT 10/17/2008  . METATARSALGIA 01/27/2010  . 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  . 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 White coat syndrome with hypertension    no medications   Past Surgical History:  Procedure Laterality Date  . APPENDECTOMY     done with colectomy  . COLON SURGERY  2008   precancerous polyps  . COLONOSCOPY  05/2018  . [place]  2012  . INGUINAL HERNIA REPAIR N/A 06/22/2018   Procedure: LAPAROSCOPIC BILATERAL INGUINAL HERNIA REPAIR;  Surgeon: Michael Boston, MD;  Location: [facility];  Service: General;  Laterality: N/A;  . INSERTION OF MESH N/A 06/22/2018   Procedure: INSERTION OF MESH;  Surgeon: Michael Boston, MD;  Location: [facility];  Service: General;  Laterality: N/A;  . KNEE ARTHROSCOPY Right   . PARTIAL COLECTOMY  2008  . TONSILLECTOMY    . 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   Procedure: TOTAL KNEE ARTHROPLASTY;  Surgeon: Dorna Leitz, MD;  Location:  [facility];  Service: Orthopedics;  Laterality: Right;    reports that he quit smoking about 50 years ago. His smoking use included cigarettes. He has a 6.00 pack-year smoking history. He has never used smokeless tobacco. He reports current alcohol use. He reports that he does not use drugs. family history includes Cancer in his father; Hyperlipidemia in his mother. Allergies  Allergen Reactions  . Codeine Sulfate Itching and Rash     Review of Systems  Constitutional: Negative for activity change, appetite change, fatigue and fever.  HENT: Negative for congestion, ear pain and trouble swallowing.   Eyes: Negative for pain and visual disturbance.  Respiratory: Negative for cough, shortness of breath and wheezing.   Cardiovascular: Negative for chest pain and palpitations.  Gastrointestinal: Negative for abdominal distention, abdominal pain, blood in stool, constipation, diarrhea, nausea, rectal pain and vomiting.  Genitourinary: Negative for dysuria, hematuria and testicular pain.  Musculoskeletal: Negative for arthralgias and joint swelling.  Skin: Negative for rash.  Neurological: Negative for dizziness, syncope and headaches.  Hematological: Negative for adenopathy.  Psychiatric/Behavioral: Negative for confusion, dysphoric mood and suicidal ideas. The patient is nervous/anxious.        Objective:   Physical Exam Constitutional:      General: He is not in acute distress.    Appearance: He is well-developed.  HENT:     Head: Normocephalic and atraumatic.     Right Ear:  External ear normal.     Left Ear: External ear normal.  Eyes:     Conjunctiva/sclera: Conjunctivae normal.     Pupils: Pupils are equal, round, and reactive to light.  Neck:     Musculoskeletal: Normal range of motion and neck supple.     Thyroid: No thyromegaly.  Cardiovascular:     Rate and Rhythm: Normal rate and regular rhythm.     Heart sounds: Normal heart sounds. No murmur.  Pulmonary:     Effort: No  respiratory distress.     Breath sounds: No wheezing or rales.  Abdominal:     General: Bowel sounds are normal. There is no distension.     Palpations: Abdomen is soft. There is no mass.     Tenderness: There is no abdominal tenderness. There is no guarding or rebound.  Lymphadenopathy:     Cervical: No cervical adenopathy.  Skin:    Findings: No rash.  Neurological:     Mental Status: He is alert and oriented to person, place, and time.     Cranial Nerves: No cranial nerve deficit.     Deep Tendon Reflexes: Reflexes normal.        Assessment:     Physical exam.  Generally healthy 78 year old male.  He has had some recent somewhat pervasive anxiety symptoms with no clear precipitating factors    Plan:     -Obtain follow-up labs. -Continue with yearly flu vaccine -We discussed trial of sertraline 50 mg 1/2 tablet daily for at least 2 weeks and then if anxiety symptoms not improved at that point increase to 1 tablet daily -Give [place] some feedback if anxiety symptoms not improving over the next 3 to 4 weeks with the above.  Previously discussed some possible counseling but he is not interested at this point  [name] MD [facility] at [facility]

## 2019-02-07 NOTE — Patient Instructions (Signed)
Start the Sertraline at one half tablet daily for at least 2 weeks  If anxiety not improved at 2 weeks may increase to one tablet daily.

## 2019-02-08 ENCOUNTER — Encounter: Payer: Self-pay | Admitting: Family Medicine

## 2019-02-08 LAB — TSH: TSH: 2.91 u[IU]/mL (ref 0.35–4.50)

## 2019-03-12 ENCOUNTER — Other Ambulatory Visit: Payer: Self-pay | Admitting: Family Medicine

## 2019-10-22 ENCOUNTER — Other Ambulatory Visit: Payer: Self-pay

## 2019-10-22 DIAGNOSIS — Z20822 Contact with and (suspected) exposure to covid-19: Secondary | ICD-10-CM

## 2019-10-23 LAB — NOVEL CORONAVIRUS, NAA: SARS-CoV-2, NAA: NOT DETECTED

## 2019-11-12 ENCOUNTER — Encounter: Payer: Self-pay | Admitting: Family Medicine

## 2019-11-12 ENCOUNTER — Telehealth (INDEPENDENT_AMBULATORY_CARE_PROVIDER_SITE_OTHER): Payer: Medicare Other | Admitting: Family Medicine

## 2019-11-12 ENCOUNTER — Other Ambulatory Visit: Payer: Self-pay

## 2019-11-12 ENCOUNTER — Ambulatory Visit: Payer: Self-pay

## 2019-11-12 VITALS — HR 60 | Temp 96.8°F | Ht 69.0 in | Wt 194.0 lb

## 2019-11-12 DIAGNOSIS — K219 Gastro-esophageal reflux disease without esophagitis: Secondary | ICD-10-CM | POA: Diagnosis not present

## 2019-11-12 NOTE — Progress Notes (Signed)
Virtual Visit via Video Note  I connected with the patient on 11/12/19 at  1:30 PM EST by a video enabled telemedicine application and verified that I am speaking with the correct person using two identifiers.  Location patient: home Location provider:work or home office Persons participating in the virtual visit: patient, provider  I discussed the limitations of evaluation and management by telemedicine and the availability of in person appointments. The patient expressed understanding and agreed to proceed.   HPI: Here for some intermittent symptoms that have been bothering him for several months. He describes episodes where he feels that he cannot take a deep enough breath, and he gets relief by taking long slow breaths. He denies feeling SOB as such. No chest pain. This is not related to exercise, in fact he works out regularly at his gym for 45 minutes at a time and this does not bother him at all. He does not cough, but he does have some slight burning in the throat and he feels the need to clear his throat frequently. This can occur during the day but it is more frequent in the evenings or at night, and it sometimes wakes him up from sleep. He takes Pantoprazole every morning for GERD, and he seldom has heartburn any more. Of note he tested negative for the Covid-19 virus on 10-22-19.    ROS: See pertinent positives and negatives per HPI.  Past Medical History:  Diagnosis Date  . Arthritis    history in knee prior to surgery  . BUNION, LEFT FOOT 09/16/2008  . 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 Complication of anesthesia    had urinary retention after surgery cause elevated heart rate and severe abd pain once catheterized symptoms resolved  . GERD 12/29/2010  . History of colon polyps   . HYPERLIPIDEMIA 05/30/2009  . Hyperthyroidism 1980's   resolved now, took medicine at the time  . MEDIAL MENISCUS TEAR, RIGHT 10/17/2008  . METATARSALGIA 01/27/2010  . 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   . 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 White coat syndrome with hypertension    no medications    Past Surgical History:  Procedure Laterality Date  . APPENDECTOMY     done with colectomy  . COLON SURGERY  2008   precancerous polyps  . COLONOSCOPY  05/2018  . [place]  2012  . INGUINAL HERNIA REPAIR N/A 06/22/2018   Procedure: LAPAROSCOPIC BILATERAL INGUINAL HERNIA REPAIR;  Surgeon: Michael Boston, MD;  Location: [facility];  Service: General;  Laterality: N/A;  . INSERTION OF MESH N/A 06/22/2018   Procedure: INSERTION OF MESH;  Surgeon: Michael Boston, MD;  Location: [facility];  Service: General;  Laterality: N/A;  . KNEE ARTHROSCOPY Right   . PARTIAL COLECTOMY  2008  . TONSILLECTOMY    . 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 Cancer Father        colon, prostate  . Hyperlipidemia Mother      Current Outpatient Medications:  .  atorvastatin (LIPITOR) 20 MG tablet, TAKE 1 TABLET BY MOUTH EVERY OTHER DAY, Disp: 45 tablet, Rfl: 3 .  ciprofloxacin (CIPRO) 500 MG tablet, TK 1 T PO BID, Disp: , Rfl:  .  gabapentin (NEURONTIN) 300 MG capsule, TK 1 C PO TID PRN.GRADUALLY INCREASE WEEK BY  WEEK TO 600MG  TID, Disp: , Rfl:  .  ibuprofen (ADVIL,MOTRIN) 200 MG tablet, Take 400 mg by mouth daily as needed for moderate pain., Disp: , Rfl:  .  Multiple Vitamin (MULTIVITAMIN) capsule, Take 1 capsule by mouth daily., Disp: , Rfl:  .  Omega-3 Fatty Acids (FISH OIL) 1200 MG CAPS, Take 1,200 mg by mouth 2 (two) times daily., Disp: , Rfl:  .  pantoprazole (PROTONIX) 40 MG tablet, TAKE 1 TABLET BY MOUTH EVERY DAY, Disp: 90 tablet, Rfl: 3 .  Polyvinyl Alcohol-Povidone (REFRESH OP), Place 1 drop into both eyes daily as needed (dry eyes)., Disp: , Rfl:  .  Probiotic Product (ULTRAFLORA IMMUNE HEALTH PO), Take 1 capsule by mouth  daily. , Disp: , Rfl:  .  sertraline (ZOLOFT) 50 MG tablet, Take 1 tablet (50 mg total) by mouth daily., Disp: 90 tablet, Rfl: 3  EXAM:  VITALS per patient if applicable:  GENERAL: alert, oriented, appears well and in no acute distress  HEENT: atraumatic, conjunttiva clear, no obvious abnormalities on inspection of external nose and ears  NECK: normal movements of the head and neck  LUNGS: on inspection no signs of respiratory distress, breathing rate appears normal, no obvious gross SOB, gasping or wheezing  CV: no obvious cyanosis  MS: moves all visible extremities without noticeable abnormality  PSYCH/NEURO: pleasant and cooperative, no obvious depression or anxiety, speech and thought processing grossly intact  ASSESSMENT AND PLAN: His symptoms may be the result of GERD. I asked him to try taking Pepcid 20 mg OTC every evening before supper along with the morning Pantoprazole. Recheck if not better in 2 weeks.  Alysia Penna, MD  Discussed the following assessment and plan:  No diagnosis found.     I discussed the assessment and treatment plan with the patient. The patient was provided an opportunity to ask questions and all were answered. The patient agreed with the plan and demonstrated an understanding of the instructions.   The patient was advised to call back or seek an in-person evaluation if the symptoms worsen or if the condition fails to improve as anticipated.

## 2019-11-12 NOTE — Telephone Encounter (Signed)
Pt. Called to report shortness of breath over past several months, with worsening the last 3 nights. Stated "when I take a deep breath, it feels like it doesn't go all the way down into my lungs."  Stated the shortness of breath has awakened him the last 3 nights, forcing him to sit up for a short period to get his breath.  Stated he has been able to continue his normal activity and participate in exercise program at the [facility].  Reported he has intermittent upper mid-chest discomfort, in an area approx. Size of palm of hand, just below the throat.  Denied any radiation of the pain to jaw, shoulder, or arm.  Denied fever/ chills.  Denied cough, sore throat, body aches, diarrhea.  Stated he has not had any known exposure to [place].  Reported he tested negative for COVID about 3 weeks ago.  Transferred pt. To the office to have an appt. Scheduled.  Advised that due to worsening SOB, with laying down, the past 3 nights, he should be evaluated today.   Pt. Agreed with plan.      Reason for Disposition . [1] MILD difficulty breathing (e.g., minimal/no SOB at rest, SOB with walking, pulse <100) AND [2] NEW-onset or WORSE than normal  Answer Assessment - Initial Assessment Questions 1. RESPIRATORY STATUS: "Describe your breathing?" (e.g., wheezing, shortness of breath, unable to speak, severe coughing)      Shortness of breath; feels like when taking a breath, it doesn't go all the way down into the lungs  2. ONSET: "When did this breathing problem begin?"      Has had increased SOB the past 3 nights  3. PATTERN "Does the difficult breathing come and go, or has it been constant since it started?"      Constant  4. SEVERITY: "How bad is your breathing?" (e.g., mild, moderate, severe)    - MILD: No SOB at rest, mild SOB with walking, speaks normally in sentences, can lay down, no retractions, pulse < 100.    - MODERATE: SOB at rest, SOB with minimal exertion and prefers to sit, cannot lie down flat, speaks in  phrases, mild retractions, audible wheezing, pulse 100-120.    - SEVERE: Very SOB at rest, speaks in single words, struggling to breathe, sitting hunched forward, retractions, pulse > 120      mild 5. RECURRENT SYMPTOM: "Have you had difficulty breathing before?" If so, ask: "When was the last time?" and "What happened that time?"      *No Answer* 6. CARDIAC HISTORY: "Do you have any history of heart disease?" (e.g., heart attack, angina, bypass surgery, angioplasty)      Denied  7. LUNG HISTORY: "Do you have any history of lung disease?"  (e.g., pulmonary embolus, asthma, emphysema)     Denied  8. CAUSE: "What do you think is causing the breathing problem?"     unknown 9. OTHER SYMPTOMS: "Do you have any other symptoms? (e.g., dizziness, runny nose, cough, chest pain, fever)     Denied fever, cough, intermittent chest discomfort in upper mid- chest; denied radiation of chest discomfort.    10. PREGNANCY: "Is there any chance you are pregnant?" "When was your last menstrual period?"       N/a  11. TRAVEL: "Have you traveled out of the country in the last month?" (e.g., travel history, exposures)       *No Answer*  Protocols used: BREATHING DIFFICULTY-A-AH

## 2019-12-16 ENCOUNTER — Other Ambulatory Visit: Payer: Self-pay | Admitting: Family Medicine

## 2019-12-17 ENCOUNTER — Encounter: Payer: Self-pay | Admitting: Family Medicine

## 2020-01-08 ENCOUNTER — Ambulatory Visit: Payer: Medicare PPO

## 2020-01-17 ENCOUNTER — Ambulatory Visit: Payer: Medicare PPO

## 2020-01-17 ENCOUNTER — Ambulatory Visit: Payer: Medicare PPO | Attending: Internal Medicine

## 2020-01-17 DIAGNOSIS — Z23 Encounter for immunization: Secondary | ICD-10-CM | POA: Insufficient documentation

## 2020-01-17 NOTE — Progress Notes (Signed)
   Covid-19 Vaccination Clinic  Name:  EYTAN KRAVETS    MRN: [id number] DOB: October 26, 1941  01/17/2020  Mr. Bartnick was observed post Covid-19 immunization for 15 minutes without incidence. He was provided with Vaccine Information Sheet and instruction to access the V-Safe system.   Mr. Fuess was instructed to call 911 with any severe reactions post vaccine: Marland Kitchen Difficulty breathing  . Swelling of your face and throat  . A fast heartbeat  . A bad rash all over your body  . Dizziness and weakness    Immunizations Administered    Name Date Dose VIS Date Route   Pfizer COVID-19 Vaccine 01/17/2020  5:02 PM 0.3 mL 11/23/2019 Intramuscular   Manufacturer: [place]   Lot: [id number]   [place]: [id number]

## 2020-01-19 ENCOUNTER — Other Ambulatory Visit: Payer: Self-pay | Admitting: Family Medicine

## 2020-01-22 ENCOUNTER — Ambulatory Visit: Payer: Medicare PPO | Attending: Internal Medicine

## 2020-01-22 DIAGNOSIS — Z20822 Contact with and (suspected) exposure to covid-19: Secondary | ICD-10-CM

## 2020-01-23 LAB — NOVEL CORONAVIRUS, NAA: SARS-CoV-2, NAA: NOT DETECTED

## 2020-02-06 ENCOUNTER — Ambulatory Visit (INDEPENDENT_AMBULATORY_CARE_PROVIDER_SITE_OTHER): Payer: Medicare PPO

## 2020-02-06 ENCOUNTER — Other Ambulatory Visit: Payer: Self-pay

## 2020-02-06 DIAGNOSIS — Z0000 Encounter for general adult medical examination without abnormal findings: Secondary | ICD-10-CM

## 2020-02-06 NOTE — Patient Instructions (Signed)
[name] , Thank you for taking time to come for your Medicare Wellness Visit. I appreciate your ongoing commitment to your health goals. Please review the following plan we discussed and let me know if I can assist you in the future.   Screening recommendations/referrals: Colorectal Screening: up to date; last colonoscopy 05/15/18  Vision and Dental Exams: Recommended annual ophthalmology exams for early detection of glaucoma and other disorders of the eye Recommended annual dental exams for proper oral hygiene  Vaccinations: Influenza vaccine: completed 08/17/19 Pneumococcal vaccine: Prevnar 01/11/14; repeat Pneumovax recommended in the future  Tdap vaccine: up to date; last 01/19/16 Shingles vaccine: Shingrix completed   Advanced directives: Please bring a copy of your POA (Power of [place]) and/or Living Will to your next appointment.  Goals: Recommend to drink at least 6-8 8oz glasses of water per day and consume a balanced diet rich in fresh fruits and vegetables.   Next appointment: Please schedule your Annual Wellness Visit with your Nurse Health Advisor in one year.  Preventive Care 25 Years and Older, Male Preventive care refers to lifestyle choices and visits with your health care provider that can promote health and wellness. What does preventive care include?  A yearly physical exam. This is also called an annual well check.  Dental exams once or twice a year.  Routine eye exams. Ask your health care provider how often you should have your eyes checked.  Personal lifestyle choices, including:  Daily care of your teeth and gums.  Regular physical activity.  Eating a healthy diet.  Avoiding tobacco and drug use.  Limiting alcohol use.  Practicing safe sex.  Taking low doses of aspirin every day if recommended by your health care provider..  Taking vitamin and mineral supplements as recommended by your health care provider. What happens during an annual well  check? The services and screenings done by your health care provider during your annual well check will depend on your age, overall health, lifestyle risk factors, and family history of disease. Counseling  Your health care provider may ask you questions about your:  Alcohol use.  Tobacco use.  Drug use.  Emotional well-being.  Home and relationship well-being.  Sexual activity.  Eating habits.  History of falls.  Memory and ability to understand (cognition).  Work and work Statistician. Screening  You may have the following tests or measurements:  Height, weight, and BMI.  Blood pressure.  Lipid and cholesterol levels. These may be checked every 5 years, or more frequently if you are over 59 years old.  Skin check.  Lung cancer screening. You may have this screening every year starting at age 23 if you have a 30-pack-year history of smoking and currently smoke or have quit within the past 15 years.  Fecal occult blood test (FOBT) of the stool. You may have this test every year starting at age 2.  Flexible sigmoidoscopy or colonoscopy. You may have a sigmoidoscopy every 5 years or a colonoscopy every 10 years starting at age 43.  Prostate cancer screening. Recommendations will vary depending on your family history and other risks.  Hepatitis C blood test.  Hepatitis B blood test.  Sexually transmitted disease (STD) testing.  Diabetes screening. This is done by checking your blood sugar (glucose) after you have not eaten for a while (fasting). You may have this done every 1-3 years.  Abdominal aortic aneurysm (AAA) screening. You may need this if you are a current or former smoker.  Osteoporosis. You may  be screened starting at age 26 if you are at high risk. Talk with your health care provider about your test results, treatment options, and if necessary, the need for more tests. Vaccines  Your health care provider may recommend certain vaccines, such  as:  Influenza vaccine. This is recommended every year.  Tetanus, diphtheria, and acellular pertussis (Tdap, Td) vaccine. You may need a Td booster every 10 years.  Zoster vaccine. You may need this after age 10.  Pneumococcal 13-valent conjugate (PCV13) vaccine. One dose is recommended after age 13.  Pneumococcal polysaccharide (PPSV23) vaccine. One dose is recommended after age 52. Talk to your health care provider about which screenings and vaccines you need and how often you need them. This information is not intended to replace advice given to you by your health care provider. Make sure you discuss any questions you have with your health care provider. Document Released: 12/26/2015 Document Revised: 08/18/2016 Document Reviewed: 09/30/2015 Elsevier Interactive Patient Education  2017 [place] Prevention in the Home Falls can cause injuries. They can happen to people of all ages. There are many things you can do to make your home safe and to help prevent falls. What can I do on the outside of my home?  Regularly fix the edges of walkways and driveways and fix any cracks.  Remove anything that might make you trip as you walk through a door, such as a raised step or threshold.  Trim any bushes or trees on the path to your home.  Use bright outdoor lighting.  Clear any walking paths of anything that might make someone trip, such as rocks or tools.  Regularly check to see if handrails are loose or broken. Make sure that both sides of any steps have handrails.  Any raised decks and porches should have guardrails on the edges.  Have any leaves, snow, or ice cleared regularly.  Use sand or salt on walking paths during winter.  Clean up any spills in your garage right away. This includes oil or grease spills. What can I do in the bathroom?  Use night lights.  Install grab bars by the toilet and in the tub and shower. Do not use towel bars as grab bars.  Use  non-skid mats or decals in the tub or shower.  If you need to sit down in the shower, use a plastic, non-slip stool.  Keep the floor dry. Clean up any water that spills on the floor as soon as it happens.  Remove soap buildup in the tub or shower regularly.  Attach bath mats securely with double-sided non-slip rug tape.  Do not have throw rugs and other things on the floor that can make you trip. What can I do in the bedroom?  Use night lights.  Make sure that you have a light by your bed that is easy to reach.  Do not use any sheets or blankets that are too big for your bed. They should not hang down onto the floor.  Have a firm chair that has side arms. You can use this for support while you get dressed.  Do not have throw rugs and other things on the floor that can make you trip. What can I do in the kitchen?  Clean up any spills right away.  Avoid walking on wet floors.  Keep items that you use a lot in easy-to-reach places.  If you need to reach something above you, use a strong step stool that has  a grab bar.  Keep electrical cords out of the way.  Do not use floor polish or wax that makes floors slippery. If you must use wax, use non-skid floor wax.  Do not have throw rugs and other things on the floor that can make you trip. What can I do with my stairs?  Do not leave any items on the stairs.  Make sure that there are handrails on both sides of the stairs and use them. Fix handrails that are broken or loose. Make sure that handrails are as long as the stairways.  Check any carpeting to make sure that it is firmly attached to the stairs. Fix any carpet that is loose or worn.  Avoid having throw rugs at the top or bottom of the stairs. If you do have throw rugs, attach them to the floor with carpet tape.  Make sure that you have a light switch at the top of the stairs and the bottom of the stairs. If you do not have them, ask someone to add them for you. What  else can I do to help prevent falls?  Wear shoes that:  Do not have high heels.  Have rubber bottoms.  Are comfortable and fit you well.  Are closed at the toe. Do not wear sandals.  If you use a stepladder:  Make sure that it is fully opened. Do not climb a closed stepladder.  Make sure that both sides of the stepladder are locked into place.  Ask someone to hold it for you, if possible.  Clearly mark and make sure that you can see:  Any grab bars or handrails.  First and last steps.  Where the edge of each step is.  Use tools that help you move around (mobility aids) if they are needed. These include:  Canes.  Walkers.  Scooters.  Crutches.  Turn on the lights when you go into a dark area. Replace any light bulbs as soon as they burn out.  Set up your furniture so you have a clear path. Avoid moving your furniture around.  If any of your floors are uneven, fix them.  If there are any pets around you, be aware of where they are.  Review your medicines with your doctor. Some medicines can make you feel dizzy. This can increase your chance of falling. Ask your doctor what other things that you can do to help prevent falls. This information is not intended to replace advice given to you by your health care provider. Make sure you discuss any questions you have with your health care provider. Document Released: 09/25/2009 Document Revised: 05/06/2016 Document Reviewed: 01/03/2015 Elsevier Interactive Patient Education  2017 [employer].

## 2020-02-06 NOTE — Progress Notes (Signed)
This visit is being conducted via phone call due to the COVID-19 pandemic. This patient has given me verbal consent via phone to conduct this visit, patient states they are participating from their home address. Some vital signs may be absent or patient reported.   Patient identification: identified by name, DOB, and current address.  Location provider: [facility] Persons participating in the virtual visit: Denman George LPN, patient, and Dr. Carolann Littler    Subjective:   Hector Lewis is a 79 y.o. male who presents for Medicare Annual/Subsequent preventive examination.  Review of Systems:   Cardiac Risk Factors include: male gender;dyslipidemia;advanced age (>77men, >57 women)    Objective:    Vitals: There were no vitals taken for this visit.  There is no height or weight on file to calculate BMI.  Advanced Directives 02/06/2020 06/12/2018 11/09/2017 09/23/2017 07/30/2016 02/06/2016  Does Patient Have a Medical Advance Directive? Yes Yes Yes Yes Yes Yes  Type of Advance Directive Living will;Healthcare Power of [place];Living will [place];Living will - - [place];Living will  Does patient want to make changes to medical advance directive? No - Patient declined No - Patient declined No - Patient declined - - No - Patient declined  Copy of [place] in Chart? No - copy requested No - copy requested - - - -    Tobacco Social History   Tobacco Use  Smoking Status Former Smoker  . Packs/day: 1.00  . Years: 6.00  . Pack years: 6.00  . Types: Cigarettes  . Quit date: 06/09/1968  . Years since quitting: 51.6  Smokeless Tobacco Never Used  Tobacco Comment   quit at 79 yo      Counseling given: Not Answered Comment: quit at 79 yo    Clinical Intake:  Pre-visit preparation completed: Yes  Pain : No/denies pain  Diabetes: No  How often do you need to have someone help you when  you read instructions, pamphlets, or other written materials from your doctor or pharmacy?: 1 - Never  Interpreter Needed?: No  Information entered by :: Denman George LPN  Past Medical History:  Diagnosis Date  . Arthritis    history in knee prior to surgery  . BUNION, LEFT FOOT 09/16/2008  . 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 Complication of anesthesia    had urinary retention after surgery cause elevated heart rate and severe abd pain once catheterized symptoms resolved  . GERD 12/29/2010  . History of colon polyps   . HYPERLIPIDEMIA 05/30/2009  . Hyperthyroidism 1980's   resolved now, took medicine at the time  . MEDIAL MENISCUS TEAR, RIGHT 10/17/2008  . METATARSALGIA 01/27/2010  . 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  . 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 White coat syndrome with hypertension    no medications   Past Surgical History:  Procedure Laterality Date  . APPENDECTOMY     done with colectomy  . COLON SURGERY  2008   precancerous polyps  . COLONOSCOPY  05/2018  . [place]  2012  . INGUINAL HERNIA REPAIR N/A 06/22/2018   Procedure: LAPAROSCOPIC BILATERAL INGUINAL HERNIA REPAIR;  Surgeon: Michael Boston, MD;  Location: [facility];  Service: General;  Laterality: N/A;  . INSERTION OF MESH N/A 06/22/2018   Procedure: INSERTION OF MESH;  Surgeon: Michael Boston, MD;  Location: [facility];  Service: General;  Laterality: N/A;  . KNEE ARTHROSCOPY Right   .  PARTIAL COLECTOMY  2008  . TONSILLECTOMY    . 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 Cancer Father        colon, prostate  . Hyperlipidemia Mother    Social History   Socioeconomic History  . Marital status: Married    Spouse name: Not on file  . Number of children: Not on file  . Years of education: Not on file  . Highest education level:  Not on file  Occupational History  . Not on file  Tobacco Use  . Smoking status: Former Smoker    Packs/day: 1.00    Years: 6.00    Pack years: 6.00    Types: Cigarettes    Quit date: 06/09/1968    Years since quitting: 51.6  . Smokeless tobacco: Never Used  . Tobacco comment: quit at 79 yo   Substance and Sexual Activity  . Alcohol use: Yes    Comment: 2 glasses per night weekends only  . Drug use: No  . Sexual activity: Not on file  Other Topics Concern  . Not on file  Social History Narrative  . Not on file   Social Determinants of Health   Financial Resource Strain:   . Difficulty of Paying Living Expenses: Not on file  Food Insecurity:   . Worried About Charity fundraiser in the Last Year: Not on file  . Ran Out of Food in the Last Year: Not on file  Transportation Needs:   . Lack of Transportation (Medical): Not on file  . Lack of Transportation (Non-Medical): Not on file  Physical Activity:   . Days of Exercise per Week: Not on file  . Minutes of Exercise per Session: Not on file  Stress:   . Feeling of Stress : Not on file  Social Connections:   . Frequency of Communication with Friends and Family: Not on file  . Frequency of Social Gatherings with Friends and Family: Not on file  . Attends Religious Services: Not on file  . Active Member of Clubs or Organizations: Not on file  . Attends Archivist Meetings: Not on file  . Marital Status: Not on file    Outpatient Encounter Medications as of 02/06/2020  Medication Sig  . atorvastatin (LIPITOR) 20 MG tablet TAKE 1 TABLET BY MOUTH EVERY OTHER DAY  . ciprofloxacin (CIPRO) 500 MG tablet TK 1 T PO BID  . gabapentin (NEURONTIN) 300 MG capsule TK 1 C PO TID PRN.GRADUALLY INCREASE WEEK BY WEEK TO 600MG  TID  . ibuprofen (ADVIL,MOTRIN) 200 MG tablet Take 400 mg by mouth daily as needed for moderate pain.  . Multiple Vitamin (MULTIVITAMIN) capsule Take 1 capsule by mouth daily.  . Omega-3 Fatty Acids (FISH  OIL) 1200 MG CAPS Take 1,200 mg by mouth 2 (two) times daily.  . pantoprazole (PROTONIX) 40 MG tablet TAKE 1 TABLET BY MOUTH EVERY DAY  . Polyvinyl Alcohol-Povidone (REFRESH OP) Place 1 drop into both eyes daily as needed (dry eyes).  . Probiotic Product (ULTRAFLORA IMMUNE HEALTH PO) Take 1 capsule by mouth daily.   . sertraline (ZOLOFT) 50 MG tablet TAKE 1 TABLET(50 MG) BY MOUTH DAILY   No facility-administered encounter medications on file as of 02/06/2020.    Activities of Daily Living In your present state of health, do you have  any difficulty performing the following activities: 02/06/2020  Hearing? N  Vision? N  Difficulty concentrating or making decisions? N  Walking or climbing stairs? N  Dressing or bathing? N  Doing errands, shopping? N  Preparing Food and eating ? N  Using the Toilet? N  In the past six months, have you accidently leaked urine? N  Do you have problems with loss of bowel control? N  Managing your Medications? N  Managing your Finances? N  Housekeeping or managing your Housekeeping? N  Some recent data might be hidden    Patient Care Team: [name], MD as PCP - Huston Foley, MD as Consulting Physician (General Surgery) Juanita Craver, MD as Consulting Physician (Gastroenterology) Dorna Leitz, MD as Consulting Physician (Orthopedic Surgery) Juluis Rainier as Consulting Physician (Optometry)   Assessment:   This is a routine wellness examination for Hector Lewis.  Exercise Activities and Dietary recommendations Current Exercise Habits: Home exercise routine;Structured exercise class, Type of exercise: walking;strength training/weights;calisthenics, Time (Minutes): 45, Frequency (Times/Week): 5, Weekly Exercise (Minutes/Week): 225, Intensity: Moderate  Goals    . patient     Will continue to try to eat well!    . patient     2 drinks per night w food is the limit          Fall Risk Fall Risk  02/06/2020 09/23/2017 07/30/2016 01/19/2016  01/15/2015  Falls in the past year? 0 Yes No No No  Comment - stumbled x 1 month ago  - - -  Number falls in past yr: 0 - - - -  Injury with Fall? 0 Yes - - -  Risk for fall due to : - Impaired balance/gait - - -  Follow up Falls evaluation completed;Education provided;Falls prevention discussed - - - -   Is the patient's home free of loose throw rugs in walkways, pet beds, electrical cords, etc?   yes      Grab bars in the bathroom? yes      Handrails on the stairs?   yes      Adequate lighting?   yes  Depression Screen PHQ 2/9 Scores 02/06/2020 09/23/2017 07/30/2016 01/19/2016  PHQ - 2 Score 0 0 0 0    Cognitive Function MMSE - Mini Mental State Exam 09/23/2017 07/30/2016  Not completed: (No Data) (No Data)     6CIT Screen 02/06/2020  What Year? 0 points  What month? 0 points  What time? 0 points  Count back from 20 0 points  Months in reverse 0 points  Repeat phrase 0 points  Total Score 0    Immunization History  Administered Date(s) Administered  . Influenza Split 09/02/2011, 10/01/2012, 10/01/2013, 09/12/2014  . Influenza Whole 09/12/2008, 09/12/2009, 09/11/2010  . Influenza, High Dose Seasonal PF 09/23/2015, 09/12/2017  . Influenza-Unspecified 09/28/2016  . PFIZER SARS-COV-2 Vaccination 01/17/2020  . Pneumococcal Conjugate-13 01/11/2014  . Pneumococcal Polysaccharide-23 12/13/2006  . Td 12/14/1995, 03/13/2006  . Tdap 01/19/2016  . Zoster 12/13/2004  . Zoster Recombinat (Shingrix) 04/22/2017, 06/12/2017    Qualifies for Shingles Vaccine? Shingrix completed   Screening Tests Health Maintenance  Topic Date Due  . PNA vac Low Risk Adult (2 of 2 - PPSV23) 01/18/2026 (Originally 01/11/2015)  . COLONOSCOPY  05/16/2023  . TETANUS/TDAP  01/18/2026  . INFLUENZA VACCINE  Completed   Cancer Screenings: Lung: Low Dose CT Chest recommended if Age 46-80 years, 30 pack-year currently smoking OR have quit w/in 15years. Patient does not qualify. Colorectal: colonoscopy 05/15/18  (repeat  in 5 years)      Plan:  I have personally reviewed and addressed the Medicare Annual Wellness questionnaire and have noted the following in the patient's chart:  A. Medical and social history B. Use of alcohol, tobacco or illicit drugs  C. Current medications and supplements D. Functional ability and status E.  Nutritional status F.  Physical activity G. Advance directives H. List of other physicians I.  Hospitalizations, surgeries, and ER visits in previous 12 months J.  [place] such as hearing and vision if needed, cognitive and depression L. Referrals, records requested, and appointments- none   In addition, I have reviewed and discussed with patient certain preventive protocols, quality metrics, and best practice recommendations. A written personalized care plan for preventive services as well as general preventive health recommendations were provided to patient.   Signed,  Denman George, LPN  Nurse Health Advisor   Nurse Notes: no additional

## 2020-02-11 ENCOUNTER — Encounter: Payer: Self-pay | Admitting: Family Medicine

## 2020-02-11 ENCOUNTER — Ambulatory Visit (INDEPENDENT_AMBULATORY_CARE_PROVIDER_SITE_OTHER): Payer: Medicare PPO | Admitting: Family Medicine

## 2020-02-11 ENCOUNTER — Other Ambulatory Visit: Payer: Self-pay

## 2020-02-11 VITALS — BP 120/86 | HR 60 | Temp 97.7°F | Wt 189.0 lb

## 2020-02-11 DIAGNOSIS — Z0000 Encounter for general adult medical examination without abnormal findings: Secondary | ICD-10-CM | POA: Diagnosis not present

## 2020-02-11 LAB — CBC WITH DIFFERENTIAL/PLATELET
Basophils Absolute: 0 10*3/uL (ref 0.0–0.1)
Basophils Relative: 0.8 % (ref 0.0–3.0)
Eosinophils Absolute: 0.3 10*3/uL (ref 0.0–0.7)
Eosinophils Relative: 5.6 % — ABNORMAL HIGH (ref 0.0–5.0)
HCT: 47.7 % (ref 39.0–52.0)
Hemoglobin: 16.3 g/dL (ref 13.0–17.0)
Lymphocytes Relative: 27.5 % (ref 12.0–46.0)
Lymphs Abs: 1.6 10*3/uL (ref 0.7–4.0)
MCHC: 34.2 g/dL (ref 30.0–36.0)
MCV: 94 fl (ref 78.0–100.0)
Monocytes Absolute: 0.5 10*3/uL (ref 0.1–1.0)
Monocytes Relative: 8.9 % (ref 3.0–12.0)
Neutro Abs: 3.4 10*3/uL (ref 1.4–7.7)
Neutrophils Relative %: 57.2 % (ref 43.0–77.0)
Platelets: 203 10*3/uL (ref 150.0–400.0)
RBC: 5.07 Mil/uL (ref 4.22–5.81)
RDW: 13.4 % (ref 11.5–15.5)
WBC: 6 10*3/uL (ref 4.0–10.5)

## 2020-02-11 LAB — LIPID PANEL
Cholesterol: 216 mg/dL — ABNORMAL HIGH (ref 0–200)
HDL: 48.8 mg/dL (ref >39.00)
LDL Cholesterol: 134 mg/dL — ABNORMAL HIGH (ref 0–99)
NonHDL: 167.08
Total CHOL/HDL Ratio: 4
Triglycerides: 165 mg/dL — ABNORMAL HIGH (ref 0.0–149.0)
VLDL: 33 mg/dL (ref 0.0–40.0)

## 2020-02-11 LAB — HEPATIC FUNCTION PANEL
ALT: 33 U/L (ref 0–53)
AST: 27 U/L (ref 0–37)
Albumin: 4.4 g/dL (ref 3.5–5.2)
Alkaline Phosphatase: 80 U/L (ref 39–117)
Bilirubin, Direct: 0.2 mg/dL (ref 0.0–0.3)
Total Bilirubin: 1 mg/dL (ref 0.2–1.2)
Total Protein: 7.4 g/dL (ref 6.0–8.3)

## 2020-02-11 LAB — BASIC METABOLIC PANEL
BUN: 13 mg/dL (ref 6–23)
CO2: 28 mEq/L (ref 19–32)
Calcium: 9.7 mg/dL (ref 8.4–10.5)
Chloride: 103 mEq/L (ref 96–112)
Creatinine, Ser: 0.99 mg/dL (ref 0.40–1.50)
GFR: 73.13 mL/min (ref >60.00)
Glucose, Bld: 92 mg/dL (ref 70–99)
Potassium: 4.7 mEq/L (ref 3.5–5.1)
Sodium: 140 mEq/L (ref 135–145)

## 2020-02-11 LAB — TSH: TSH: 3.35 u[IU]/mL (ref 0.35–4.50)

## 2020-02-11 NOTE — Patient Instructions (Addendum)

## 2020-02-11 NOTE — Progress Notes (Signed)
Subjective:     Patient ID: Hector Lewis, male   DOB: December 28, 1940, 79 y.o.   MRN: [id number]  HPI Hector Lewis is seen for physical exam.  He had his wife just had moved to an apartment little over a year ago.  That is working well for them.  [facility] [employer] [facility] couple [facility] per [facility].  Exercising fairly regularly.  No recent falls.  No depression concerns.  He gets his second Covid vaccine tomorrow.  Other health maintenance reviewed and up-to-date.  His medications are reviewed he remains on atorvastatin, pantoprazole, and sertraline  Past Medical History:  Diagnosis Date  . Arthritis    history in knee prior to surgery  . BUNION, LEFT FOOT 09/16/2008  . 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 Complication of anesthesia    had urinary retention after surgery cause elevated heart rate and severe abd pain once catheterized symptoms resolved  . GERD 12/29/2010  . History of colon polyps   . HYPERLIPIDEMIA 05/30/2009  . Hyperthyroidism 1980's   resolved now, took medicine at the time  . MEDIAL MENISCUS TEAR, RIGHT 10/17/2008  . METATARSALGIA 01/27/2010  . 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  . 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 White coat syndrome with hypertension    no medications   Past Surgical History:  Procedure Laterality Date  . APPENDECTOMY     done with colectomy  . COLON SURGERY  2008   precancerous polyps  . COLONOSCOPY  05/2018  . [place]  2012  . INGUINAL HERNIA REPAIR N/A 06/22/2018   Procedure: LAPAROSCOPIC BILATERAL INGUINAL HERNIA REPAIR;  Surgeon: Michael Boston, MD;  [facility];  Service: General;  Laterality: N/A;  . INSERTION OF MESH N/A 06/22/2018   Procedure: INSERTION OF MESH;  Surgeon: Michael Boston, MD;  Location[facility];  Service: General;  Laterality: N/A;  . KNEE ARTHROSCOPY Right   . PARTIAL COLECTOMY  2008  . TONSILLECTOMY    . 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 His smoking use included cigarettes. He has a 6.00 pack-year smoking history. He has never used smokeless tobacco. He reports current alcohol use. He reports that he does not use drugs. family history includes Cancer in his father; Hyperlipidemia in his mother. Allergies  Allergen Reactions  . Codeine Sulfate Itching and Rash   Wt Readings from Last 3 Encounters:  02/11/20 189 lb (85.7 kg)  11/12/19 194 lb (88 kg)  02/07/19 191 lb (86.6 kg)    Health Maintenance  Topic Date Due  . PNA vac Low Risk Adult (2 of 2 - PPSV23) 01/18/2026 (Originally 01/11/2015)  . COLONOSCOPY  05/16/2023  . TETANUS/TDAP  01/18/2026  . INFLUENZA VACCINE  Completed     Review of Systems  Constitutional: Negative for appetite change, fatigue and unexpected weight change.  Eyes: Negative for visual disturbance.  Respiratory: Negative for cough, chest tightness and shortness of breath.   Cardiovascular: Negative for chest pain, palpitations and leg swelling.  Gastrointestinal: Negative for abdominal pain.  Endocrine: Negative for polydipsia and polyuria.  Genitourinary: Negative for dysuria.  Neurological: Negative for dizziness, syncope, weakness, light-headedness and headaches.  Hematological: Negative for adenopathy.  Psychiatric/Behavioral: Negative for dysphoric mood.       Objective:   Physical  Exam Constitutional:      Appearance: He is well-developed.  HENT:     Right Ear: External ear normal.     Left Ear: External ear normal.  Eyes:     Pupils: Pupils are equal, round, and reactive to light.  Neck:     Thyroid: No thyromegaly.  Cardiovascular:     Rate and Rhythm: Normal rate and regular rhythm.  Pulmonary:     Effort: Pulmonary effort is normal. No respiratory distress.     Breath sounds: Normal breath sounds. No wheezing or rales.  Musculoskeletal:      Cervical back: Neck supple.  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  He is followed closely by dermatology.  No ulcerative lesions.  Neurological:     Mental Status: He is alert and oriented to person, place, and time.        Assessment:     Physical exam.  He has hyperlipidemia and GERD which are stable symptomatically.  Addressed health maintenance issues as below    Plan:     -Obtain follow-up screening labs -Continue with annual flu vaccine -He get second Covid vaccine tomorrow -We will be due for repeat colonoscopy in about 3 years.  He is undecided whether he will follow through with that since he will be around age 43 at that time. -Continue regular exercise habits  [name] MD [facility]

## 2020-02-12 ENCOUNTER — Other Ambulatory Visit: Payer: Self-pay | Admitting: *Deleted

## 2020-02-12 ENCOUNTER — Ambulatory Visit: Payer: Medicare PPO | Attending: Internal Medicine

## 2020-02-12 DIAGNOSIS — Z23 Encounter for immunization: Secondary | ICD-10-CM | POA: Insufficient documentation

## 2020-02-12 MED ORDER — ATORVASTATIN CALCIUM 20 MG PO TABS: 20.0000 mg | ORAL_TABLET | Freq: Every day | ORAL | 3 refills | Status: DC

## 2020-02-12 MED ORDER — ATORVASTATIN CALCIUM 20 MG PO TABS: 20.0000 mg | ORAL_TABLET | ORAL | 3 refills | Status: DC

## 2020-02-12 NOTE — Addendum Note (Signed)
Addended by: Zacarias Pontes on: 02/12/2020 04:37 PM   Modules accepted: Orders

## 2020-02-12 NOTE — Progress Notes (Signed)
   Covid-19 Vaccination Clinic  Name:  Hector Lewis    MRN: [id number] DOB: July 19, 1941  02/12/2020  Hector Lewis was observed post Covid-19 immunization for 15 minutes without incident. He was provided with Vaccine Information Sheet and instruction to access the V-Safe system.   Hector Lewis was instructed to call 911 with any severe reactions post vaccine: Mar[place] Kitchen Difficulty breathing  . Swelling of face and throat  . A fast heartbeat  . A bad rash all over body  . Dizziness and weakness   Immunizations Administered    Name Date Dose VIS Date Route   Pfizer COVID-19 Vaccine 02/12/2020 10:03 AM 0.3 mL 11/23/2019 Intramuscular   Manufacturer: [place]   Lot: [id number]   [place]: [id number]

## 2020-03-10 ENCOUNTER — Other Ambulatory Visit: Payer: Self-pay | Admitting: Family Medicine

## 2020-04-20 ENCOUNTER — Other Ambulatory Visit: Payer: Self-pay | Admitting: Family Medicine

## 2020-06-01 ENCOUNTER — Other Ambulatory Visit: Payer: Self-pay | Admitting: Family Medicine

## 2020-07-15 DIAGNOSIS — H40013 Open angle with borderline findings, low risk, bilateral: Secondary | ICD-10-CM | POA: Diagnosis not present

## 2020-08-24 ENCOUNTER — Other Ambulatory Visit: Payer: Self-pay | Admitting: Family Medicine

## 2020-10-20 DIAGNOSIS — L723 Sebaceous cyst: Secondary | ICD-10-CM | POA: Diagnosis not present

## 2020-10-20 DIAGNOSIS — Z85828 Personal history of other malignant neoplasm of skin: Secondary | ICD-10-CM | POA: Diagnosis not present

## 2020-10-20 DIAGNOSIS — D1801 Hemangioma of skin and subcutaneous tissue: Secondary | ICD-10-CM | POA: Diagnosis not present

## 2020-10-20 DIAGNOSIS — L821 Other seborrheic keratosis: Secondary | ICD-10-CM | POA: Diagnosis not present

## 2020-10-20 DIAGNOSIS — L570 Actinic keratosis: Secondary | ICD-10-CM | POA: Diagnosis not present

## 2020-10-20 DIAGNOSIS — L718 Other rosacea: Secondary | ICD-10-CM | POA: Diagnosis not present

## 2020-12-15 ENCOUNTER — Encounter: Payer: Self-pay | Admitting: Podiatry

## 2020-12-15 ENCOUNTER — Ambulatory Visit: Payer: Medicare Other | Admitting: Podiatry

## 2020-12-15 ENCOUNTER — Other Ambulatory Visit: Payer: Self-pay

## 2020-12-15 DIAGNOSIS — L600 Ingrowing nail: Secondary | ICD-10-CM

## 2020-12-15 NOTE — Patient Instructions (Signed)

## 2020-12-16 NOTE — Progress Notes (Signed)
Subjective:   Patient ID: Hector Lewis, male   DOB: 80 y.o.   MRN: [id number]   HPI Patient presents stating he has developed a painful ingrown toenail of his right third nail stating that it is been irritated and hard for him to wear shoe gear with.  States he is tried to trim it and soak it and it also has turned colors and patient does not smoke likes to be active   Review of Systems  All other systems reviewed and are negative.       Objective:  Physical Exam Vitals and nursing note reviewed.  Constitutional:      Appearance: He is well-developed and well-nourished.  Cardiovascular:     Pulses: Intact distal pulses.  Pulmonary:     Effort: Pulmonary effort is normal.  Musculoskeletal:        General: Normal range of motion.  Skin:    General: Skin is warm.  Neurological:     Mental Status: He is alert.     Neurovascular status intact muscle strength found to be adequate range of motion adequate.  Patient has had permanent nail procedures of to his nails and the third right has become thickened and incurvated in the corners and it makes shoe gear difficult.  Patient has good digital perfusion well oriented x3     Assessment:  Combination of deformed ingrown toenail third right along with mycotic nail infection     Plan:  H&P reviewed both conditions and I do think nail removal would be best.  I did discuss fungal infection also and we opted for permanent removal.  Patient wants surgery and today I infiltrated the right third toe 60 mg like Marcaine mixture sterile prep done and using sterile instrumentation I remove the nail exposed the matrix applied phenol 3 applications 30 seconds followed by alcohol lavage sterile dressing.  Gave instructions on soaks reappoint to be seen back and encouraged him to call with questions concerns

## 2021-01-13 DIAGNOSIS — Z800 Family history of malignant neoplasm of digestive organs: Secondary | ICD-10-CM | POA: Diagnosis not present

## 2021-01-13 DIAGNOSIS — R194 Change in bowel habit: Secondary | ICD-10-CM | POA: Diagnosis not present

## 2021-01-13 DIAGNOSIS — K219 Gastro-esophageal reflux disease without esophagitis: Secondary | ICD-10-CM | POA: Diagnosis not present

## 2021-01-13 DIAGNOSIS — Z86010 Personal history of colonic polyps: Secondary | ICD-10-CM | POA: Diagnosis not present

## 2021-01-20 DIAGNOSIS — H40013 Open angle with borderline findings, low risk, bilateral: Secondary | ICD-10-CM | POA: Diagnosis not present

## 2021-01-29 ENCOUNTER — Other Ambulatory Visit: Payer: Self-pay | Admitting: Family Medicine

## 2021-02-14 ENCOUNTER — Other Ambulatory Visit: Payer: Self-pay | Admitting: Family Medicine

## 2021-02-16 ENCOUNTER — Other Ambulatory Visit: Payer: Self-pay

## 2021-02-17 ENCOUNTER — Ambulatory Visit (INDEPENDENT_AMBULATORY_CARE_PROVIDER_SITE_OTHER): Payer: Medicare PPO | Admitting: Family Medicine

## 2021-02-17 ENCOUNTER — Encounter: Payer: Self-pay | Admitting: Family Medicine

## 2021-02-17 VITALS — BP 140/70 | HR 58 | Temp 98.3°F | Ht 70.5 in | Wt 193.0 lb

## 2021-02-17 DIAGNOSIS — K5730 Diverticulosis of large intestine without perforation or abscess without bleeding: Secondary | ICD-10-CM | POA: Diagnosis not present

## 2021-02-17 DIAGNOSIS — Z800 Family history of malignant neoplasm of digestive organs: Secondary | ICD-10-CM | POA: Diagnosis not present

## 2021-02-17 DIAGNOSIS — Z0000 Encounter for general adult medical examination without abnormal findings: Secondary | ICD-10-CM | POA: Diagnosis not present

## 2021-02-17 DIAGNOSIS — K219 Gastro-esophageal reflux disease without esophagitis: Secondary | ICD-10-CM | POA: Diagnosis not present

## 2021-02-17 DIAGNOSIS — E785 Hyperlipidemia, unspecified: Secondary | ICD-10-CM

## 2021-02-17 DIAGNOSIS — R194 Change in bowel habit: Secondary | ICD-10-CM | POA: Diagnosis not present

## 2021-02-17 DIAGNOSIS — R140 Abdominal distension (gaseous): Secondary | ICD-10-CM | POA: Diagnosis not present

## 2021-02-17 DIAGNOSIS — Z86010 Personal history of colonic polyps: Secondary | ICD-10-CM | POA: Diagnosis not present

## 2021-02-17 LAB — LIPID PANEL
Cholesterol: 180 mg/dL (ref 0–200)
HDL: 50.3 mg/dL (ref >39.00)
LDL Cholesterol: 98 mg/dL (ref 0–99)
NonHDL: 129.41
Total CHOL/HDL Ratio: 4
Triglycerides: 158 mg/dL — ABNORMAL HIGH (ref 0.0–149.0)
VLDL: 31.6 mg/dL (ref 0.0–40.0)

## 2021-02-17 NOTE — Patient Instructions (Signed)
Preventive Care 65 Years and Older, Male Preventive care refers to lifestyle choices and visits with your health care provider that can promote health and wellness. This includes:  A yearly physical exam. This is also called an annual wellness visit.  Regular dental and eye exams.  Immunizations.  Screening for certain conditions.  Healthy lifestyle choices, such as: ? Eating a healthy diet. ? Getting regular exercise. ? Not using drugs or products that contain nicotine and tobacco. ? Limiting alcohol use. What can I expect for my preventive care visit? Physical exam Your health care provider will check your:  Height and weight. These may be used to calculate your BMI (body mass index). BMI is a measurement that tells if you are at a healthy weight.  Heart rate and blood pressure.  Body temperature.  Skin for abnormal spots. Counseling Your health care provider may ask you questions about your:  Past medical problems.  Family's medical history.  Alcohol, tobacco, and drug use.  Emotional well-being.  Home life and relationship well-being.  Sexual activity.  Diet, exercise, and sleep habits.  History of falls.  Memory and ability to understand (cognition).  Work and work environment.  Access to firearms. What immunizations do I need? Vaccines are usually given at various ages, according to a schedule. Your health care provider will recommend vaccines for you based on your age, medical history, and lifestyle or other factors, such as travel or where you work.   What tests do I need? Blood tests  Lipid and cholesterol levels. These may be checked every 5 years, or more often depending on your overall health.  Hepatitis C test.  Hepatitis B test. Screening  Lung cancer screening. You may have this screening every year starting at age 79 if you have a 30-pack-year history of smoking and currently smoke or have quit within the past 15 years.  Colorectal  cancer screening. ? All adults should have this screening starting at age 79 and continuing until age 75. ? Your health care provider may recommend screening at age 45 if you are at increased risk. ? You will have tests every 1-10 years, depending on your results and the type of screening test.  Prostate cancer screening. Recommendations will vary depending on your family history and other risks.  Genital exam to check for testicular cancer or hernias.  Diabetes screening. ? This is done by checking your blood sugar (glucose) after you have not eaten for a while (fasting). ? You may have this done every 1-3 years.  Abdominal aortic aneurysm (AAA) screening. You may need this if you are a current or former smoker.  STD (sexually transmitted disease) testing, if you are at risk. Follow these instructions at home: Eating and drinking  Eat a diet that includes fresh fruits and vegetables, whole grains, lean protein, and low-fat dairy products. Limit your intake of foods with high amounts of sugar, saturated fats, and salt.  Take vitamin and mineral supplements as recommended by your health care provider.  Do not drink alcohol if your health care provider tells you not to drink.  If you drink alcohol: ? Limit how much you have to 0-2 drinks a day. ? Be aware of how much alcohol is in your drink. In the [place] one drink equals one 12 oz bottle of beer (355 mL), one 5 oz glass of wine (148 mL), or one 1 oz glass of hard liquor (44 mL).   Lifestyle  Take daily care of your teeth   and gums. Brush your teeth every morning and night with fluoride toothpaste. Floss one time each day.  Stay active. Exercise for at least 30 minutes 5 or more days each week.  Do not use any products that contain nicotine or tobacco, such as cigarettes, e-cigarettes, and chewing tobacco. If you need help quitting, ask your health care provider.  Do not use drugs.  If you are sexually active, practice safe sex.  Use a condom or other form of protection to prevent STIs (sexually transmitted infections).  Talk with your health care provider about taking a low-dose aspirin or statin.  Find healthy ways to cope with stress, such as: ? Meditation, yoga, or listening to music. ? Journaling. ? Talking to a trusted person. ? Spending time with friends and family. Safety  Always wear your seat belt while driving or riding in a vehicle.  Do not drive: ? If you have been drinking alcohol. Do not ride with someone who has been drinking. ? When you are tired or distracted. ? While texting.  Wear a helmet and other protective equipment during sports activities.  If you have firearms in your house, make sure you follow all gun safety procedures. What's next?  Visit your health care provider once a year for an annual wellness visit.  Ask your health care provider how often you should have your eyes and teeth checked.  Stay up to date on all vaccines. This information is not intended to replace advice given to you by your health care provider. Make sure you discuss any questions you have with your health care provider. Document Revised: 08/28/2019 Document Reviewed: 11/23/2018 Elsevier Patient Education  2021 [place].  Monitor blood pressure and be in touch if consistently > 140/90.

## 2021-02-17 NOTE — Progress Notes (Signed)
Established Patient Office Visit  Subjective:  Patient ID: Hector Lewis, male    DOB: 1941-09-23  Age: 80 y.o. MRN: [id number]  CC:  Chief Complaint  Patient presents with  . Annual Exam    HPI Hector Lewis presents for physical exam.  He has history of GERD, type osteoarthritis, hyperlipidemia.  Has had history of adenomatous polyps.  He has had history of partial colectomy in the past.  He gets colonoscopies every 5 years.  He has recently had some issues with frequent bowel movements up to 3 to 4/day with frequent abdominal cramps and gas.  He was tried by GI on Xifaxan but did not improve.  He is currently taking a probiotic.  He had multiple recent labs done in February including basic metabolic panel, TSH, CBC, hepatic panel and these were reviewed and all basically normal. No recent antibiotic use.  He has hyperlipidemia treated with Lipitor.  Does need follow-up lipids.  No history of hepatitis C screening.  Low risk. Other health maintenance reviewed and up-to-date.  He had flu vaccine in September.  Tetanus due 2027.  Colonoscopy due 2024.  Has had previous pneumonia vaccines.  COVID-19 vaccines complete.  He continues to exercise regularly.  He goes to the [facility] and frequently plays pickle ball.  Social history-he is married.  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  Quit smoking 1969.  No regular alcohol use. He and his wife plan to travel to [place] next October  Family history-significant for his father having colon cancer and prostate cancer.  Mother had hyperlipidemia history.  Past Medical History:  Diagnosis Date  . Arthritis    history in knee prior to surgery  . BUNION, LEFT FOOT 09/16/2008  . 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 Complication of anesthesia    had urinary retention after surgery cause elevated heart rate and severe abd pain once catheterized symptoms resolved  . GERD 12/29/2010  . History of colon polyps   . HYPERLIPIDEMIA  05/30/2009  . Hyperthyroidism 1980's   resolved now, took medicine at the time  . MEDIAL MENISCUS TEAR, RIGHT 10/17/2008  . METATARSALGIA 01/27/2010  . 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  . 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 White coat syndrome with hypertension    no medications    Past Surgical History:  Procedure Laterality Date  . APPENDECTOMY     done with colectomy  . COLON SURGERY  2008   precancerous polyps  . COLONOSCOPY  05/2018  . [place]  2012  . INGUINAL HERNIA REPAIR N/A 06/22/2018   Procedure: LAPAROSCOPIC BILATERAL INGUINAL HERNIA REPAIR;  Surgeon: Michael Boston, MD;  Location: [facility];  Service: General;  Laterality: N/A;  . INSERTION OF MESH N/A 06/22/2018   Procedure: INSERTION OF MESH;  Surgeon: Michael Boston, MD;  Location: [facility];  Service: General;  Laterality: N/A;  . KNEE ARTHROSCOPY Right   . PARTIAL COLECTOMY  2008  . TONSILLECTOMY    . 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   Procedure: TOTAL KNEE ARTHROPLASTY;  Surgeon: Dorna Leitz, MD;  Location: [facility];  Service: Orthopedics;  Laterality: Right;    Family History  Problem Relation Age of Onset  . Cancer Father        colon, prostate  . Hyperlipidemia Mother     Social History   Socioeconomic History  . Marital status: Married    Spouse name: Not on file  . Number of children: Not  on file  . Years of education: Not on file  . Highest education level: Not on file  Occupational History  . Not on file  Tobacco Use  . Smoking status: Former Smoker    Packs/day: 1.00    Years: 6.00    Pack years: 6.00    Types: Cigarettes    Quit date: 06/09/1968    Years since quitting: 52.7  . Smokeless tobacco: Never Used  . Tobacco comment: quit at 80 yo   Vaping Use  . Vaping Use: Never used  Substance and Sexual Activity  . Alcohol use: Yes    Comment: 2 glasses per night weekends only  . Drug use: No  . Sexual activity: Not on file  Other Topics Concern  . Not on file  Social History  Narrative  . Not on file   Social Determinants of Health   Financial Resource Strain: Not on file  Food Insecurity: Not on file  Transportation Needs: Not on file  Physical Activity: Not on file  Stress: Not on file  Social Connections: Not on file  Intimate Partner Violence: Not on file    Outpatient Medications Prior to Visit  Medication Sig Dispense Refill  . atorvastatin (LIPITOR) 20 MG tablet TAKE 1 TABLET BY MOUTH EVERY OTHER DAY (Patient taking differently: daily.) 90 tablet 0  . Famotidine (PEPCID AC PO)     . Multiple Vitamin (MULTIVITAMIN) capsule Take 1 capsule by mouth daily.    . Omega-3 Fatty Acids (FISH OIL) 1200 MG CAPS Take 1,200 mg by mouth 2 (two) times daily.    . pantoprazole (PROTONIX) 40 MG tablet TAKE 1 TABLET BY MOUTH EVERY DAY 90 tablet 2  . Polyvinyl Alcohol-Povidone (REFRESH OP) Place 1 drop into both eyes daily as needed (dry eyes).    . Probiotic Product (ULTRAFLORA IMMUNE HEALTH PO) Take 1 capsule by mouth daily.     . sertraline (ZOLOFT) 50 MG tablet TAKE 1 TABLET(50 MG) BY MOUTH DAILY 90 tablet 0   No facility-administered medications prior to visit.    Allergies  Allergen Reactions  . Codeine Sulfate Itching and Rash    ROS Review of Systems  Constitutional: Negative for activity change, appetite change, fatigue and fever.  HENT: Negative for congestion, ear pain and trouble swallowing.   Eyes: Negative for pain and visual disturbance.  Respiratory: Negative for cough, shortness of breath and wheezing.   Cardiovascular: Negative for chest pain and palpitations.  Gastrointestinal: Positive for diarrhea. Negative for abdominal distention, blood in stool, constipation, nausea, rectal pain and vomiting.  Genitourinary: Negative for dysuria, hematuria and testicular pain.  Musculoskeletal: Negative for arthralgias and joint swelling.  Skin: Negative for rash.  Neurological: Negative for dizziness, syncope and headaches.  Hematological:  Negative for adenopathy.  Psychiatric/Behavioral: Negative for confusion and dysphoric mood.      Objective:    Physical Exam Constitutional:      General: He is not in acute distress.    Appearance: He is well-developed and well-nourished.  HENT:     Head: Normocephalic and atraumatic.     Right Ear: External ear normal.     Left Ear: External ear normal.     Mouth/Throat:     Mouth: Oropharynx is clear and moist.  Eyes:     Extraocular Movements: EOM normal.     Conjunctiva/sclera: Conjunctivae normal.     Pupils: Pupils are equal, round, and reactive to light.  Neck:     Thyroid: No thyromegaly.  Cardiovascular:  Rate and Rhythm: Normal rate and regular rhythm.     Heart sounds: Normal heart sounds. No murmur heard.   Pulmonary:     Effort: No respiratory distress.     Breath sounds: No wheezing or rales.  Abdominal:     General: Bowel sounds are normal. There is no distension.     Palpations: Abdomen is soft. There is no mass.     Tenderness: There is no abdominal tenderness. There is no guarding or rebound.  Musculoskeletal:        General: No edema.     Cervical back: Normal range of motion and neck supple.     Right lower leg: No edema.     Left lower leg: No edema.  Lymphadenopathy:     Cervical: No cervical adenopathy.  Skin:    Findings: No rash.  Neurological:     Mental Status: He is alert and oriented to person, place, and time.     Cranial Nerves: No cranial nerve deficit.  Psychiatric:        Mood and Affect: Mood and affect normal.     BP 140/70 (BP Location: Left Arm, Patient Position: Sitting, Cuff Size: Normal)   Pulse (!) 58   Temp 98.3 F (36.8 C) (Oral)   Ht 5' 10.5" (1.791 m)   Wt 193 lb (87.5 kg)   SpO2 96%   BMI 27.30 kg/m  Wt Readings from Last 3 Encounters:  02/17/21 193 lb (87.5 kg)  02/11/20 189 lb (85.7 kg)  11/12/19 194 lb (88 kg)     Health Maintenance Due  Topic Date Due  . Hepatitis C Screening  Never done  .  COVID-19 Vaccine (3 - Booster for Pfizer series) 12/13/2020    There are no preventive care reminders to display for this patient.  Lab Results  Component Value Date   TSH 3.35 02/11/2020   Lab Results  Component Value Date   WBC 6.0 02/11/2020   HGB 16.3 02/11/2020   HCT 47.7 02/11/2020   MCV 94.0 02/11/2020   PLT 203.0 02/11/2020   Lab Results  Component Value Date   NA 140 02/11/2020   K 4.7 02/11/2020   CO2 28 02/11/2020   GLUCOSE 92 02/11/2020   BUN 13 02/11/2020   CREATININE 0.99 02/11/2020   BILITOT 1.0 02/11/2020   ALKPHOS 80 02/11/2020   AST 27 02/11/2020   ALT 33 02/11/2020   PROT 7.4 02/11/2020   ALBUMIN 4.4 02/11/2020   CALCIUM 9.7 02/11/2020   ANIONGAP 9 06/12/2018   GFR 73.13 02/11/2020   Lab Results  Component Value Date   CHOL 216 (H) 02/11/2020   Lab Results  Component Value Date   HDL 48.80 02/11/2020   Lab Results  Component Value Date   LDLCALC 134 (H) 02/11/2020   Lab Results  Component Value Date   TRIG 165.0 (H) 02/11/2020   Lab Results  Component Value Date   CHOLHDL 4 02/11/2020   No results found for: HGBA1C    Assessment & Plan:   Problem List Items Addressed This Visit      Unprioritized   Hyperlipidemia   Relevant Orders   Lipid panel    Other Visit Diagnoses    Physical exam    -  Primary   Relevant Orders   Hep C Antibody    -Check labs as above with hepatitis C antibody and lipid panel -Continue regular exercise habits -Continue annual flu vaccine -Other vaccines up-to-date including Shingrix  No  orders of the defined types were placed in this encounter.   Follow-up: No follow-ups on file.    Carolann Littler, MD

## 2021-02-18 LAB — HEPATITIS C ANTIBODY
Hepatitis C Ab: NONREACTIVE
SIGNAL TO CUT-OFF: 0.01 (ref <1.00)

## 2021-02-25 ENCOUNTER — Encounter: Payer: Self-pay | Admitting: Podiatry

## 2021-03-04 ENCOUNTER — Encounter: Payer: Self-pay | Admitting: Family Medicine

## 2021-03-26 ENCOUNTER — Telehealth: Payer: Self-pay | Admitting: Family Medicine

## 2021-03-26 MED ORDER — ATORVASTATIN CALCIUM 20 MG PO TABS: 1.0000 | ORAL_TABLET | ORAL | 0 refills | Status: DC

## 2021-03-26 NOTE — Telephone Encounter (Signed)
atorvastatin (LIPITOR) 20 MG tablet pt  Says he only have 45 pills he need another 45 to make [facility] is his current new pharmacy he like to use  [address], [place] [address]  [phone]

## 2021-03-26 NOTE — Telephone Encounter (Signed)
Rx done. 

## 2021-03-27 ENCOUNTER — Encounter: Payer: Self-pay | Admitting: Family Medicine

## 2021-03-30 ENCOUNTER — Other Ambulatory Visit: Payer: Self-pay

## 2021-03-30 MED ORDER — ATORVASTATIN CALCIUM 20 MG PO TABS: 1.0000 | ORAL_TABLET | Freq: Every day | ORAL | 3 refills | Status: DC

## 2021-03-30 NOTE — Telephone Encounter (Signed)
Please advise. Should the patient be taking this once a day or every other day?

## 2021-03-30 NOTE — Addendum Note (Signed)
Addended byRebecca Eaton on: 03/30/2021 02:10 PM   Modules accepted: Orders

## 2021-04-14 ENCOUNTER — Ambulatory Visit (INDEPENDENT_AMBULATORY_CARE_PROVIDER_SITE_OTHER): Payer: Medicare PPO

## 2021-04-14 DIAGNOSIS — Z0000 Encounter for general adult medical examination without abnormal findings: Secondary | ICD-10-CM | POA: Diagnosis not present

## 2021-04-14 NOTE — Progress Notes (Signed)
Subjective:   Hector Lewis is a 80 y.o. male who presents for Medicare Annual/Subsequent preventive examination.  I connected with Hector Lewis today by telephone and verified that I am speaking with the correct person using two identifiers. Location patient: home Location provider: work Persons participating in the virtual visit: patient, provider.   I discussed the limitations, risks, security and privacy concerns of performing an evaluation and management service by telephone and the availability of in person appointments. I also discussed with the patient that there may be a patient responsible charge related to this service. The patient expressed understanding and verbally consented to this telephonic visit.    Interactive audio and video telecommunications were attempted between this provider and patient, however failed, due to patient having technical difficulties OR patient did not have access to video capability.  We continued and completed visit with audio only.    Review of Systems    n/a Cardiac Risk Factors include: advanced age (>105men, >13 women);dyslipidemia;male gender     Objective:    Today's Vitals   There is no height or weight on file to calculate BMI.  Advanced Directives 04/14/2021 02/06/2020 06/12/2018 11/09/2017 09/23/2017 07/30/2016 02/06/2016  Does Patient Have a Medical Advance Directive? No Yes Yes Yes Yes Yes Yes  Type of Advance Directive - Living will;Healthcare Power of [place];Living will [place];Living will - - [place];Living will  Does patient want to make changes to medical advance directive? - No - Patient declined No - Patient declined No - Patient declined - - No - Patient declined  Copy of [place] in Chart? - No - copy requested No - copy requested - - - -  Would patient like information on creating a medical advance directive? No - Patient declined  - - - - - -    Current Medications (verified) Outpatient Encounter Medications as of 04/14/2021  Medication Sig  . atorvastatin (LIPITOR) 20 MG tablet Take 1 tablet (20 mg total) by mouth daily.  . Multiple Vitamin (MULTIVITAMIN) capsule Take 1 capsule by mouth daily.  . Omega-3 Fatty Acids (FISH OIL) 1200 MG CAPS Take 1,200 mg by mouth 2 (two) times daily.  . pantoprazole (PROTONIX) 40 MG tablet TAKE 1 TABLET BY MOUTH EVERY DAY  . Polyvinyl Alcohol-Povidone (REFRESH OP) Place 1 drop into both eyes daily as needed (dry eyes).  . Probiotic Product (ULTRAFLORA IMMUNE HEALTH PO) Take 1 capsule by mouth daily.   . Famotidine (PEPCID AC PO)   . [DISCONTINUED] sertraline (ZOLOFT) 50 MG tablet TAKE 1 TABLET(50 MG) BY MOUTH DAILY   No facility-administered encounter medications on file as of 04/14/2021.    Allergies (verified) Codeine sulfate   History: Past Medical History:  Diagnosis Date  . Arthritis    history in knee prior to surgery  . BUNION, LEFT FOOT 09/16/2008  . 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 Complication of anesthesia    had urinary retention after surgery cause elevated heart rate and severe abd pain once catheterized symptoms resolved  . GERD 12/29/2010  . History of colon polyps   . HYPERLIPIDEMIA 05/30/2009  . Hyperthyroidism 1980's   resolved now, took medicine at the time  . MEDIAL MENISCUS TEAR, RIGHT 10/17/2008  . METATARSALGIA 01/27/2010  . 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  . 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 White coat syndrome with hypertension    no medications   Past Surgical History:  Procedure Laterality Date  . APPENDECTOMY     done with colectomy  . COLON SURGERY  2008   precancerous polyps  . COLONOSCOPY  05/2018  . [place]  2012  . INGUINAL HERNIA REPAIR N/A 06/22/2018   Procedure: LAPAROSCOPIC BILATERAL INGUINAL HERNIA REPAIR;  Surgeon: Michael Boston, MD;  [facility];  Service: General;  Laterality: N/A;  . INSERTION OF MESH N/A  06/22/2018   Procedure: INSERTION OF MESH;  Surgeon: Michael Boston, MD;  [facility];  Service: General;  Laterality: N/A;  . KNEE ARTHROSCOPY Right   . PARTIAL COLECTOMY  2008  . TONSILLECTOMY    . 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   Procedure: TOTAL KNEE ARTHROPLASTY;  Surgeon: Dorna Leitz, MD;  Location: [facility];  Service: Orthopedics;  Laterality: Right;   Family History  Problem Relation Age of Onset  . Cancer Father        colon, prostate  . Hyperlipidemia Mother    Social History   Socioeconomic History  . Marital status: Married    Spouse name: Not on file  . Number of children: Not on file  . Years of education: Not on file  . Highest education level: Not on file  Occupational History  . Not on file  Tobacco Use  . Smoking status: Former Smoker    Packs/day: 1.00    Years: 6.00    Pack years: 6.00    Types: Cigarettes    Quit date: 06/09/1968    Years since quitting: 52.8  . Smokeless tobacco: Never Used  . Tobacco comment: quit at 80 yo   Vaping Use  . Vaping Use: Never used  Substance and Sexual Activity  . Alcohol use: Yes    Comment: 2 glasses per night weekends only  . Drug use: No  . Sexual activity: Not on file  Other Topics Concern  . Not on file  Social History Narrative  . Not on file   Social Determinants of Health   Financial Resource Strain: Low Risk   . Difficulty of Paying Living Expenses: Not hard at all  Food Insecurity: No Food Insecurity  . Worried About Charity fundraiser in the Last Year: Never true  . Ran Out of Food in the Last Year: Never true  Transportation Needs: No Transportation Needs  . Lack of Transportation (Medical): No  . Lack of Transportation (Non-Medical): No  Physical Activity: Sufficiently Active  . Days of Exercise per Week: 5 days  . Minutes of Exercise per Session: 50 min  Stress: No Stress Concern Present  . Feeling of Stress : Not at all  Social Connections: Moderately Integrated  . Frequency of  Communication with Friends and Family: More than three times a week  . Frequency of Social Gatherings with Friends and Family: More than three times a week  . Attends Religious Services: Never  . Active Member of Clubs or Organizations: Yes  . Attends Archivist Meetings: 1 to 4 times per year  . Marital Status: Married    Tobacco Counseling Counseling given: Not Answered Comment: quit at 80 yo    Clinical Intake:  Pre-visit preparation completed: Yes  Pain : No/denies pain     Nutritional Risks: None Diabetes: No  How often do you need to have someone help you when you read instructions, pamphlets, or other written materials from your doctor or pharmacy?: 1 - Never What is the last grade level you completed in school?: college  Diabetic?no  Interpreter Needed?: No  Information entered by :: L.Purcell Jungbluth,LPN   Activities of Daily Living In your present state of health, do you have any difficulty performing the following activities: 04/14/2021  Hearing? N  Vision? N  Difficulty concentrating or making decisions? N  Walking or climbing stairs? N  Dressing or bathing? N  Doing errands, shopping? N  Preparing Food and eating ? N  Using the Toilet? N  In the past six months, have you accidently leaked urine? N  Do you have problems with loss of bowel control? N  Managing your Medications? N  Managing your Finances? N  Housekeeping or managing your Housekeeping? N  Some recent data might be hidden    Patient Care Team: [name], MD as PCP - Huston Foley, MD as Consulting Physician (General Surgery) Juanita Craver, MD as Consulting Physician (Gastroenterology) Dorna Leitz, MD as Consulting Physician (Orthopedic Surgery) Juluis Rainier as Consulting Physician (Optometry)  Indicate any recent Medical Services you may have received from other than [facility] providers in the past year (date may be approximate).     Assessment:   This is a routine  wellness examination for Gwyndolyn Saxon.  Hearing/Vision screen  Hearing Screening   125Hz  250Hz  500Hz  1000Hz  2000Hz  3000Hz  4000Hz  6000Hz  8000Hz   Right ear:           Left ear:           Vision Screening Comments: Annual eye exams wears glasses   Dietary issues and exercise activities discussed: Current Exercise Habits: Home exercise routine;Structured exercise class, Type of exercise: walking, Time (Minutes): 40, Frequency (Times/Week): 3, Weekly Exercise (Minutes/Week): 120, Intensity: Mild, Exercise limited by: None identified  Goals Addressed   None    Depression Screen PHQ 2/9 Scores 04/14/2021 02/17/2021 02/06/2020 09/23/2017 07/30/2016 01/19/2016 01/15/2015  PHQ - 2 Score 0 0 0 0 0 0 0    Fall Risk Fall Risk  04/14/2021 02/17/2021 02/06/2020 09/23/2017 07/30/2016  Falls in the past year? 0 0 0 Yes No  Comment - - - stumbled x 1 month ago  -  Number falls in past yr: 0 0 0 - -  Injury with Fall? 0 0 0 Yes -  Risk for fall due to : - - - Impaired balance/gait -  Follow up Falls evaluation completed - Falls evaluation completed;Education provided;Falls prevention discussed - -    FALL RISK PREVENTION PERTAINING TO THE HOME:  Any stairs in or around the home? Yes  If so, are there any without handrails? Yes  Home free of loose throw rugs in walkways, pet beds, electrical cords, etc? Yes  Adequate lighting in your home to reduce risk of falls? Yes   ASSISTIVE DEVICES UTILIZED TO PREVENT FALLS:  Life alert? No  Use of a cane, walker or w/c? No  Grab bars in the bathroom? Yes  Shower chair or bench in shower? Yes  Elevated toilet seat or a handicapped toilet? Yes    Cognitive Function:  Normal cognitive status assessed by direct observation by this Nurse Health Advisor. No abnormalities found.   MMSE - Mini Mental State Exam 09/23/2017 07/30/2016  Not completed: (No Data) (No Data)     6CIT Screen 02/06/2020  What Year? 0 points  What month? 0 points  What time? 0 points  Count back  from 20 0 points  Months in reverse 0 points  Repeat phrase 0 points  Total Score 0    Immunizations Immunization History  Administered Date(s)  Administered  . Fluad Quad(high Dose 65+) 08/17/2019  . Influenza Split 09/02/2011, 10/01/2012, 10/01/2013, 09/12/2014  . Influenza Whole 09/12/2008, 09/12/2009, 09/11/2010  . Influenza, High Dose Seasonal PF 09/23/2015, 09/14/2017, 08/25/2018  . Influenza-Unspecified 09/28/2016, 07/29/2018, 08/17/2019, 08/13/2020  . PFIZER(Purple Top)SARS-COV-2 Vaccination 01/17/2020, 02/12/2020, 06/12/2020  . Pneumococcal Conjugate-13 01/11/2014  . Pneumococcal Polysaccharide-23 12/13/2006  . Td 12/14/1995, 03/13/2006  . Tdap 01/19/2016  . Zoster 12/13/2004  . Zoster Recombinat (Shingrix) 04/22/2017, 06/12/2017    TDAP status: Up to date  Flu Vaccine status: Up to date  Pneumococcal vaccine status: Up to date  Covid-19 vaccine status: Completed vaccines  Qualifies for Shingles Vaccine? Yes   Zostavax completed No   Shingrix Completed?: Yes  Screening Tests Health Maintenance  Topic Date Due  . COVID-19 Vaccine (3 - Booster for Pfizer series) 12/13/2020  . PNA vac Low Risk Adult (2 of 2 - PPSV23) 01/18/2026 (Originally 01/11/2015)  . INFLUENZA VACCINE  07/13/2021  . COLONOSCOPY (Pts 45-30yrs Insurance coverage will need to be confirmed)  05/16/2023  . TETANUS/TDAP  01/18/2026  . Hepatitis C Screening  Completed  . HPV VACCINES  Aged Out    Health Maintenance  Health Maintenance Due  Topic Date Due  . COVID-19 Vaccine (3 - Booster for Pfizer series) 12/13/2020    Colorectal cancer screening: Type of screening: Colonoscopy. Completed 0603/2019. Repeat every 5 years  Lung Cancer Screening: (Low Dose CT Chest recommended if Age 51-80 years, 30 pack-year currently smoking OR have quit w/in 15years.) does not qualify.   Lung Cancer Screening Referral: n/a  Additional Screening:  Hepatitis C Screening: does not qualify; Completed  02/17/2021   Vision Screening: Recommended annual ophthalmology exams for early detection of glaucoma and other disorders of the eye. Is the patient up to date with their annual eye exam?  Yes  Who is the provider or what is the name of the office in which the patient attends annual eye exams? 2x year  If pt is not established with a provider, would they like to be referred to a provider to establish care? No .   Dental Screening: Recommended annual dental exams for proper oral hygiene  Community Resource Referral / Chronic Care Management: CRR required this visit?  No   CCM required this visit?  No      Plan:     I have personally reviewed and noted the following in the patient's chart:   . Medical and social history . Use of alcohol, tobacco or illicit drugs  . Current medications and supplements including opioid prescriptions. Patient is not currently taking opioid prescriptions. . Functional ability and status . Nutritional status . Physical activity . Advanced directives . List of other physicians . Hospitalizations, surgeries, and ER visits in previous 12 months . Vitals . Screenings to include cognitive, depression, and falls . Referrals and appointments  In addition, I have reviewed and discussed with patient certain preventive protocols, quality metrics, and best practice recommendations. A written personalized care plan for preventive services as well as general preventive health recommendations were provided to patient.     Randel Pigg, LPN   [id number]   Nurse Notes: none

## 2021-04-14 NOTE — Patient Instructions (Signed)
Hector Lewis , Thank you for taking time to come for your Medicare Wellness Visit. I appreciate your ongoing commitment to your health goals. Please review the following plan we discussed and let me know if I can assist you in the future.   Screening recommendations/referrals: Colonoscopy: current due 05/16/2023 Recommended yearly ophthalmology/optometry visit for glaucoma screening and checkup Recommended yearly dental visit for hygiene and checkup  Vaccinations: Influenza vaccine: current due fall 2022 Pneumococcal vaccine: completed series Tdap vaccine: current due 01/18/2026 Shingles vaccine: completed series    Advanced directives: copies in chart   Conditions/risks identified: none   Next appointment: none   Preventive Care 30 Years and Older, Male Preventive care refers to lifestyle choices and visits with your health care provider that can promote health and wellness. What does preventive care include?  A yearly physical exam. This is also called an annual well check.  Dental exams once or twice a year.  Routine eye exams. Ask your health care provider how often you should have your eyes checked.  Personal lifestyle choices, including:  Daily care of your teeth and gums.  Regular physical activity.  Eating a healthy diet.  Avoiding tobacco and drug use.  Limiting alcohol use.  Practicing safe sex.  Taking low doses of aspirin every day.  Taking vitamin and mineral supplements as recommended by your health care provider. What happens during an annual well check? The services and screenings done by your health care provider during your annual well check will depend on your age, overall health, lifestyle risk factors, and family history of disease. Counseling  Your health care provider may ask you questions about your:  Alcohol use.  Tobacco use.  Drug use.  Emotional well-being.  Home and relationship well-being.  Sexual activity.  Eating  habits.  History of falls.  Memory and ability to understand (cognition).  Work and work Statistician. Screening  You may have the following tests or measurements:  Height, weight, and BMI.  Blood pressure.  Lipid and cholesterol levels. These may be checked every 5 years, or more frequently if you are over 41 years old.  Skin check.  Lung cancer screening. You may have this screening every year starting at age 69 if you have a 30-pack-year history of smoking and currently smoke or have quit within the past 15 years.  Fecal occult blood test (FOBT) of the stool. You may have this test every year starting at age 59.  Flexible sigmoidoscopy or colonoscopy. You may have a sigmoidoscopy every 5 years or a colonoscopy every 10 years starting at age 60.  Prostate cancer screening. Recommendations will vary depending on your family history and other risks.  Hepatitis C blood test.  Hepatitis B blood test.  Sexually transmitted disease (STD) testing.  Diabetes screening. This is done by checking your blood sugar (glucose) after you have not eaten for a while (fasting). You may have this done every 1-3 years.  Abdominal aortic aneurysm (AAA) screening. You may need this if you are a current or former smoker.  Osteoporosis. You may be screened starting at age 43 if you are at high risk. Talk with your health care provider about your test results, treatment options, and if necessary, the need for more tests. Vaccines  Your health care provider may recommend certain vaccines, such as:  Influenza vaccine. This is recommended every year.  Tetanus, diphtheria, and acellular pertussis (Tdap, Td) vaccine. You may need a Td booster every 10 years.  Zoster vaccine. You  may need this after age 24.  Pneumococcal 13-valent conjugate (PCV13) vaccine. One dose is recommended after age 31.  Pneumococcal polysaccharide (PPSV23) vaccine. One dose is recommended after age 30. Talk to your health  care provider about which screenings and vaccines you need and how often you need them. This information is not intended to replace advice given to you by your health care provider. Make sure you discuss any questions you have with your health care provider. Document Released: 12/26/2015 Document Revised: 08/18/2016 Document Reviewed: 09/30/2015 Elsevier Interactive Patient Education  2017 [place] Prevention in the Home Falls can cause injuries. They can happen to people of all ages. There are many things you can do to make your home safe and to help prevent falls. What can I do on the outside of my home?  Regularly fix the edges of walkways and driveways and fix any cracks.  Remove anything that might make you trip as you walk through a door, such as a raised step or threshold.  Trim any bushes or trees on the path to your home.  Use bright outdoor lighting.  Clear any walking paths of anything that might make someone trip, such as rocks or tools.  Regularly check to see if handrails are loose or broken. Make sure that both sides of any steps have handrails.  Any raised decks and porches should have guardrails on the edges.  Have any leaves, snow, or ice cleared regularly.  Use sand or salt on walking paths during winter.  Clean up any spills in your garage right away. This includes oil or grease spills. What can I do in the bathroom?  Use night lights.  Install grab bars by the toilet and in the tub and shower. Do not use towel bars as grab bars.  Use non-skid mats or decals in the tub or shower.  If you need to sit down in the shower, use a plastic, non-slip stool.  Keep the floor dry. Clean up any water that spills on the floor as soon as it happens.  Remove soap buildup in the tub or shower regularly.  Attach bath mats securely with double-sided non-slip rug tape.  Do not have throw rugs and other things on the floor that can make you trip. What can I do  in the bedroom?  Use night lights.  Make sure that you have a light by your bed that is easy to reach.  Do not use any sheets or blankets that are too big for your bed. They should not hang down onto the floor.  Have a firm chair that has side arms. You can use this for support while you get dressed.  Do not have throw rugs and other things on the floor that can make you trip. What can I do in the kitchen?  Clean up any spills right away.  Avoid walking on wet floors.  Keep items that you use a lot in easy-to-reach places.  If you need to reach something above you, use a strong step stool that has a grab bar.  Keep electrical cords out of the way.  Do not use floor polish or wax that makes floors slippery. If you must use wax, use non-skid floor wax.  Do not have throw rugs and other things on the floor that can make you trip. What can I do with my stairs?  Do not leave any items on the stairs.  Make sure that there are handrails on both  sides of the stairs and use them. Fix handrails that are broken or loose. Make sure that handrails are as long as the stairways.  Check any carpeting to make sure that it is firmly attached to the stairs. Fix any carpet that is loose or worn.  Avoid having throw rugs at the top or bottom of the stairs. If you do have throw rugs, attach them to the floor with carpet tape.  Make sure that you have a light switch at the top of the stairs and the bottom of the stairs. If you do not have them, ask someone to add them for you. What else can I do to help prevent falls?  Wear shoes that:  Do not have high heels.  Have rubber bottoms.  Are comfortable and fit you well.  Are closed at the toe. Do not wear sandals.  If you use a stepladder:  Make sure that it is fully opened. Do not climb a closed stepladder.  Make sure that both sides of the stepladder are locked into place.  Ask someone to hold it for you, if possible.  Clearly mark  and make sure that you can see:  Any grab bars or handrails.  First and last steps.  Where the edge of each step is.  Use tools that help you move around (mobility aids) if they are needed. These include:  Canes.  Walkers.  Scooters.  Crutches.  Turn on the lights when you go into a dark area. Replace any light bulbs as soon as they burn out.  Set up your furniture so you have a clear path. Avoid moving your furniture around.  If any of your floors are uneven, fix them.  If there are any pets around you, be aware of where they are.  Review your medicines with your doctor. Some medicines can make you feel dizzy. This can increase your chance of falling. Ask your doctor what other things that you can do to help prevent falls. This information is not intended to replace advice given to you by your health care provider. Make sure you discuss any questions you have with your health care provider. Document Released: 09/25/2009 Document Revised: 05/06/2016 Document Reviewed: 01/03/2015 Elsevier Interactive Patient Education  2017 [employer].

## 2021-05-07 ENCOUNTER — Other Ambulatory Visit: Payer: Self-pay | Admitting: Family Medicine

## 2021-06-08 ENCOUNTER — Other Ambulatory Visit: Payer: Self-pay | Admitting: Family Medicine

## 2021-06-17 ENCOUNTER — Encounter: Payer: Self-pay | Admitting: Family Medicine

## 2021-07-21 DIAGNOSIS — H40013 Open angle with borderline findings, low risk, bilateral: Secondary | ICD-10-CM | POA: Diagnosis not present

## 2021-09-15 DIAGNOSIS — R194 Change in bowel habit: Secondary | ICD-10-CM | POA: Diagnosis not present

## 2021-09-15 DIAGNOSIS — Z86010 Personal history of colonic polyps: Secondary | ICD-10-CM | POA: Diagnosis not present

## 2021-09-15 DIAGNOSIS — Z800 Family history of malignant neoplasm of digestive organs: Secondary | ICD-10-CM | POA: Diagnosis not present

## 2021-09-15 DIAGNOSIS — K5730 Diverticulosis of large intestine without perforation or abscess without bleeding: Secondary | ICD-10-CM | POA: Diagnosis not present

## 2021-09-15 DIAGNOSIS — K219 Gastro-esophageal reflux disease without esophagitis: Secondary | ICD-10-CM | POA: Diagnosis not present

## 2021-10-13 DIAGNOSIS — H2512 Age-related nuclear cataract, left eye: Secondary | ICD-10-CM | POA: Diagnosis not present

## 2021-10-13 DIAGNOSIS — H2513 Age-related nuclear cataract, bilateral: Secondary | ICD-10-CM | POA: Diagnosis not present

## 2021-10-13 DIAGNOSIS — H25043 Posterior subcapsular polar age-related cataract, bilateral: Secondary | ICD-10-CM | POA: Diagnosis not present

## 2021-10-13 DIAGNOSIS — H25013 Cortical age-related cataract, bilateral: Secondary | ICD-10-CM | POA: Diagnosis not present

## 2021-10-13 DIAGNOSIS — H18413 Arcus senilis, bilateral: Secondary | ICD-10-CM | POA: Diagnosis not present

## 2021-10-19 ENCOUNTER — Ambulatory Visit: Payer: Medicare PPO | Admitting: Podiatry

## 2021-10-19 ENCOUNTER — Other Ambulatory Visit: Payer: Self-pay

## 2021-10-19 ENCOUNTER — Ambulatory Visit (INDEPENDENT_AMBULATORY_CARE_PROVIDER_SITE_OTHER): Payer: Medicare PPO

## 2021-10-19 ENCOUNTER — Encounter: Payer: Self-pay | Admitting: Podiatry

## 2021-10-19 DIAGNOSIS — L84 Corns and callosities: Secondary | ICD-10-CM

## 2021-10-19 DIAGNOSIS — M7752 Other enthesopathy of left foot: Secondary | ICD-10-CM

## 2021-10-19 MED ORDER — TRIAMCINOLONE ACETONIDE 10 MG/ML IJ SUSP
10.0000 mg | Freq: Once | INTRAMUSCULAR | Status: AC
  Administered 2021-10-19: 10 mg

## 2021-10-20 DIAGNOSIS — L820 Inflamed seborrheic keratosis: Secondary | ICD-10-CM | POA: Diagnosis not present

## 2021-10-20 DIAGNOSIS — L821 Other seborrheic keratosis: Secondary | ICD-10-CM | POA: Diagnosis not present

## 2021-10-20 DIAGNOSIS — D2271 Melanocytic nevi of right lower limb, including hip: Secondary | ICD-10-CM | POA: Diagnosis not present

## 2021-10-20 DIAGNOSIS — D225 Melanocytic nevi of trunk: Secondary | ICD-10-CM | POA: Diagnosis not present

## 2021-10-20 DIAGNOSIS — Z85828 Personal history of other malignant neoplasm of skin: Secondary | ICD-10-CM | POA: Diagnosis not present

## 2021-10-20 DIAGNOSIS — L570 Actinic keratosis: Secondary | ICD-10-CM | POA: Diagnosis not present

## 2021-10-21 NOTE — Progress Notes (Signed)
Subjective:   Patient ID: Hector Lewis, male   DOB: 80 y.o.   MRN: [id number]   HPI Patient has developed a lot of inflammation around the outside of the left foot and states there is fluid buildup and it is hard to walk or wear shoe gear comfortably.  Also has developed a lesion underneath the outside of the left foot with a lucent type core that is painful   ROS      Objective:  Physical Exam  Neurovascular status intact with inflammation pain around the fifth MPJ left fluid buildup and lesions of the fifth metatarsal left that is now part of the inflammation fluid that is painful     Assessment:  Inflammatory capsulitis of the fifth MPJ left with fluid buildup with plantar porokeratotic lesion formation     Plan:  H&P reviewed condition sterile prep and injected the fifth MPJ 3 mg dexamethasone Kenalog 5 mg Xylocaine debrided plantar lesion and we will see the results and may require more extensive treatment depending on response  X-rays indicate that there is some reactivity around the fifth MPJ with moderate tailor's bunion deformity no indications of arthritis stress fracture

## 2021-11-10 DIAGNOSIS — M25561 Pain in right knee: Secondary | ICD-10-CM | POA: Diagnosis not present

## 2022-01-01 DIAGNOSIS — H2512 Age-related nuclear cataract, left eye: Secondary | ICD-10-CM | POA: Diagnosis not present

## 2022-01-01 DIAGNOSIS — H2511 Age-related nuclear cataract, right eye: Secondary | ICD-10-CM | POA: Diagnosis not present

## 2022-01-01 DIAGNOSIS — H25812 Combined forms of age-related cataract, left eye: Secondary | ICD-10-CM | POA: Diagnosis not present

## 2022-01-29 DIAGNOSIS — H2511 Age-related nuclear cataract, right eye: Secondary | ICD-10-CM | POA: Diagnosis not present

## 2022-02-22 ENCOUNTER — Encounter: Payer: Self-pay | Admitting: Family Medicine

## 2022-02-22 ENCOUNTER — Ambulatory Visit (INDEPENDENT_AMBULATORY_CARE_PROVIDER_SITE_OTHER): Payer: Medicare PPO | Admitting: Family Medicine

## 2022-02-22 VITALS — BP 162/80 | HR 75 | Temp 97.5°F | Ht 70.5 in | Wt 199.0 lb

## 2022-02-22 DIAGNOSIS — Z0000 Encounter for general adult medical examination without abnormal findings: Secondary | ICD-10-CM | POA: Diagnosis not present

## 2022-02-22 LAB — CBC WITH DIFFERENTIAL/PLATELET
Basophils Absolute: 0 10*3/uL (ref 0.0–0.1)
Basophils Relative: 0.8 % (ref 0.0–3.0)
Eosinophils Absolute: 0.2 10*3/uL (ref 0.0–0.7)
Eosinophils Relative: 3.3 % (ref 0.0–5.0)
HCT: 46.1 % (ref 39.0–52.0)
Hemoglobin: 16.1 g/dL (ref 13.0–17.0)
Lymphocytes Relative: 28.8 % (ref 12.0–46.0)
Lymphs Abs: 1.6 10*3/uL (ref 0.7–4.0)
MCHC: 34.8 g/dL (ref 30.0–36.0)
MCV: 92.4 fl (ref 78.0–100.0)
Monocytes Absolute: 0.5 10*3/uL (ref 0.1–1.0)
Monocytes Relative: 9.2 % (ref 3.0–12.0)
Neutro Abs: 3.2 10*3/uL (ref 1.4–7.7)
Neutrophils Relative %: 57.9 % (ref 43.0–77.0)
Platelets: 170 10*3/uL (ref 150.0–400.0)
RBC: 4.99 Mil/uL (ref 4.22–5.81)
RDW: 13.3 % (ref 11.5–15.5)
WBC: 5.5 10*3/uL (ref 4.0–10.5)

## 2022-02-22 LAB — HEPATIC FUNCTION PANEL
ALT: 32 U/L (ref 0–53)
AST: 24 U/L (ref 0–37)
Albumin: 4.4 g/dL (ref 3.5–5.2)
Alkaline Phosphatase: 66 U/L (ref 39–117)
Bilirubin, Direct: 0.1 mg/dL (ref 0.0–0.3)
Total Bilirubin: 0.8 mg/dL (ref 0.2–1.2)
Total Protein: 6.9 g/dL (ref 6.0–8.3)

## 2022-02-22 LAB — LIPID PANEL
Cholesterol: 165 mg/dL (ref 0–200)
HDL: 48.7 mg/dL (ref >39.00)
LDL Cholesterol: 86 mg/dL (ref 0–99)
NonHDL: 116.33
Total CHOL/HDL Ratio: 3
Triglycerides: 153 mg/dL — ABNORMAL HIGH (ref 0.0–149.0)
VLDL: 30.6 mg/dL (ref 0.0–40.0)

## 2022-02-22 LAB — BASIC METABOLIC PANEL
BUN: 19 mg/dL (ref 6–23)
CO2: 28 mEq/L (ref 19–32)
Calcium: 9.3 mg/dL (ref 8.4–10.5)
Chloride: 105 mEq/L (ref 96–112)
Creatinine, Ser: 1.17 mg/dL (ref 0.40–1.50)
GFR: 59.12 mL/min — ABNORMAL LOW (ref >60.00)
Glucose, Bld: 103 mg/dL — ABNORMAL HIGH (ref 70–99)
Potassium: 4.2 mEq/L (ref 3.5–5.1)
Sodium: 140 mEq/L (ref 135–145)

## 2022-02-22 LAB — TSH: TSH: 3.54 u[IU]/mL (ref 0.35–5.50)

## 2022-02-22 MED ORDER — LOSARTAN POTASSIUM 50 MG PO TABS: 50.0000 mg | ORAL_TABLET | Freq: Every day | ORAL | 5 refills | Status: DC

## 2022-02-22 NOTE — Progress Notes (Signed)
Established Patient Office Visit  Subjective:  Patient ID: Hector Lewis, male    DOB: 1941-12-01  Age: 81 y.o. MRN: [id number]  CC:  Chief Complaint  Patient presents with   Annual Exam    HPI Hector Lewis presents for physical exam.  He had cataract surgery back in January and February and that went well.  He and his wife got to go to [place] last October.  They had a great trip for that.  He continues to exercise well.  He gave up alcohol completely January 1 and has lost some weight.  However, has had some elevated blood pressures by multiple home readings.  These have been consistently over 140 with high around 782 systolic and diastolics as high as 96.  No headaches.  No chest pains.  No peripheral edema.  Health maintenance reviewed:  -Pneumonia vaccines complete -Colonoscopy due next year -Tetanus due 2027 -Shingles vaccine complete -Previous hepatitis C screen negative -Flu vaccine already given  Family history-mother lived to be 104.  She had history of hyperlipidemia.  Father had history of colon cancer and prostate cancer.  He has older brother who is alive and well who is 63  Social history-he is married.  Retired several years ago.  Quit smoking 1969.  Rare alcohol use.   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   White coat syndrome with hypertension    no medications    Past Surgical History:  Procedure Laterality Date    APPENDECTOMY     done with colectomy   COLON SURGERY  2008   precancerous polyps   COLONOSCOPY  05/2018   HAMMER TOE SURGERY  2012   INGUINAL HERNIA REPAIR N/A 06/22/2018   Procedure: LAPAROSCOPIC BILATERAL INGUINAL HERNIA REPAIR;  Surgeon: Michael Boston, MD;  Location: [facility];  Service: General;  Laterality: N/A;   INSERTION OF MESH N/A 06/22/2018   Procedure: INSERTION OF MESH;  Surgeon: Michael Boston, MD;  Location: [facility];  Service: General;  Laterality: N/A;   KNEE ARTHROSCOPY Right    PARTIAL COLECTOMY  2008   TONSILLECTOMY     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   Procedure: TOTAL KNEE ARTHROPLASTY;  Surgeon: Dorna Leitz, MD;  Location: [facility];  Service: Orthopedics;  Laterality: Right;    Family History  Problem Relation Age of Onset   Hyperlipidemia Mother    Cancer Father        colon, prostate    Social History   Socioeconomic History   Marital status: Married    Spouse name: Not on file   Number of children: Not on file   Years of education: Not on file   Highest education level: Not on file  Occupational History   Not on file  Tobacco Use   Smoking status: Former    Packs/day: 1.00    Years: 6.00    Pack years:  6.00    Types: Cigarettes    Quit date: 06/09/1968    Years since quitting: 53.7   Smokeless tobacco: Never   Tobacco comments:    quit at 81 yo   Vaping Use   Vaping Use: Never used  Substance and Sexual Activity   Alcohol use: Yes    Comment: 2 glasses per night weekends only   Drug use: No   Sexual activity: Not on file  Other Topics Concern   Not on file  Social History Narrative   Not on file   Social Determinants of Health   Financial Resource Strain: Low Risk    Difficulty of Paying Living Expenses: Not hard at all  Food Insecurity: No Food Insecurity   Worried About Charity fundraiser in the Last Year: Never true   [place] in the Last Year: Never true  Transportation Needs: No Transportation Needs   Lack of  Transportation (Medical): No   Lack of Transportation (Non-Medical): No  Physical Activity: Sufficiently Active   Days of Exercise per Week: 5 days   Minutes of Exercise per Session: 50 min  Stress: No Stress Concern Present   Feeling of Stress : Not at all  Social Connections: Moderately Integrated   Frequency of Communication with Friends and Family: More than three times a week   Frequency of Social Gatherings with Friends and Family: More than three times a week   Attends Religious Services: Never   Marine scientist or Organizations: Yes   Attends Music therapist: 1 to 4 times per year   Marital Status: Married  Human resources officer Violence: Not At Risk   Fear of Current or Ex-Partner: No   Emotionally Abused: No   Physically Abused: No   Sexually Abused: No    Outpatient Medications Prior to Visit  Medication Sig Dispense Refill   atorvastatin (LIPITOR) 20 MG tablet Take 1 tablet (20 mg total) by mouth daily. 90 tablet 3   Multiple Vitamin (MULTIVITAMIN) capsule Take 1 capsule by mouth daily.     Omega-3 Fatty Acids (FISH OIL) 1200 MG CAPS Take 1,200 mg by mouth 2 (two) times daily.     pantoprazole (PROTONIX) 40 MG tablet TAKE 1 TABLET BY MOUTH EVERY DAY 90 tablet 2   Polyvinyl Alcohol-Povidone (REFRESH OP) Place 1 drop into both eyes daily as needed (dry eyes).     Probiotic Product (ULTRAFLORA IMMUNE HEALTH PO) Take 1 capsule by mouth daily.      No facility-administered medications prior to visit.    Allergies  Allergen Reactions   Codeine Sulfate Itching and Rash    ROS Review of Systems  Constitutional:  Negative for activity change, appetite change, fatigue and fever.  HENT:  Negative for congestion, ear pain and trouble swallowing.   Eyes:  Negative for pain and visual disturbance.  Respiratory:  Negative for cough, shortness of breath and wheezing.   Cardiovascular:  Negative for chest pain and palpitations.  Gastrointestinal:  Negative for  abdominal distention, abdominal pain, blood in stool, constipation, diarrhea, nausea, rectal pain and vomiting.  Genitourinary:  Negative for dysuria, hematuria and testicular pain.  Musculoskeletal:  Negative for arthralgias and joint swelling.  Skin:  Negative for rash.  Neurological:  Negative for dizziness, syncope and headaches.  Hematological:  Negative for adenopathy.  Psychiatric/Behavioral:  Negative for confusion and dysphoric mood.      Objective:    Physical Exam Constitutional:      General:  He is not in acute distress.    Appearance: He is well-developed.  HENT:     Head: Normocephalic and atraumatic.     Right Ear: External ear normal.     Left Ear: External ear normal.  Neck:     Thyroid: No thyromegaly.  Cardiovascular:     Rate and Rhythm: Normal rate and regular rhythm.     Heart sounds: Normal heart sounds. No murmur heard. Pulmonary:     Effort: No respiratory distress.     Breath sounds: No wheezing or rales.  Abdominal:     General: Bowel sounds are normal. There is no distension.     Palpations: Abdomen is soft. There is no mass.     Tenderness: There is no abdominal tenderness. There is no guarding or rebound.  Musculoskeletal:     Cervical back: Normal range of motion and neck supple.     Right lower leg: No edema.     Left lower leg: No edema.  Lymphadenopathy:     Cervical: No cervical adenopathy.  Skin:    Findings: No rash.  Neurological:     Mental Status: He is alert and oriented to person, place, and time.     Cranial Nerves: No cranial nerve deficit.    BP (!) 162/80 (BP Location: Left Arm, Cuff Size: Normal)    Pulse 75    Temp (!) 97.5 F (36.4 C) (Oral)    Ht 5' 10.5" (1.791 m)    Wt 199 lb (90.3 kg)    SpO2 96%    BMI 28.15 kg/m  Wt Readings from Last 3 Encounters:  02/22/22 199 lb (90.3 kg)  02/17/21 193 lb (87.5 kg)  02/11/20 189 lb (85.7 kg)     Health Maintenance Due  Topic Date Due   Pneumonia Vaccine 109+ Years old (3  - PPSV23 if available, else PCV20) 01/11/2015   COVID-19 Vaccine (4 - Booster for Pfizer series) 08/07/2020    There are no preventive care reminders to display for this patient.  Lab Results  Component Value Date   TSH 3.35 02/11/2020   Lab Results  Component Value Date   WBC 6.0 02/11/2020   HGB 16.3 02/11/2020   HCT 47.7 02/11/2020   MCV 94.0 02/11/2020   PLT 203.0 02/11/2020   Lab Results  Component Value Date   NA 140 02/11/2020   K 4.7 02/11/2020   CO2 28 02/11/2020   GLUCOSE 92 02/11/2020   BUN 13 02/11/2020   CREATININE 0.99 02/11/2020   BILITOT 1.0 02/11/2020   ALKPHOS 80 02/11/2020   AST 27 02/11/2020   ALT 33 02/11/2020   PROT 7.4 02/11/2020   ALBUMIN 4.4 02/11/2020   CALCIUM 9.7 02/11/2020   ANIONGAP 9 06/12/2018   GFR 73.13 02/11/2020   Lab Results  Component Value Date   CHOL 180 02/17/2021   Lab Results  Component Value Date   HDL 50.30 02/17/2021   Lab Results  Component Value Date   LDLCALC 98 02/17/2021   Lab Results  Component Value Date   TRIG 158.0 (H) 02/17/2021   Lab Results  Component Value Date   CHOLHDL 4 02/17/2021   No results found for: HGBA1C    Assessment & Plan:   Problem List Items Addressed This Visit   None Visit Diagnoses     Physical exam    -  Primary   Relevant Orders   Basic metabolic panel   Lipid panel   CBC with Differential/Platelet  TSH   Hepatic function panel     He has hypertension with reading 140/70 last year and confirmed today after rest 162/80 with multiple home readings over several weeks consistently over 518 systolic.  -We discussed nonpharmacologic management with weight control and regular exercise and watching sodium intake -Start losartan 50 mg once daily -Set up 1 month follow-up to reassess -Continue annual flu vaccine -He plans to discuss with GI follow-up colonoscopy next year  Meds ordered this encounter  Medications   losartan (COZAAR) 50 MG tablet    Sig: Take 1  tablet (50 mg total) by mouth daily.    Dispense:  30 tablet    Refill:  5    Follow-up: Return in about 4 months (around 06/24/2022).    Carolann Littler, MD

## 2022-02-23 ENCOUNTER — Other Ambulatory Visit: Payer: Self-pay | Admitting: Family Medicine

## 2022-02-26 DIAGNOSIS — H18593 Other hereditary corneal dystrophies, bilateral: Secondary | ICD-10-CM | POA: Diagnosis not present

## 2022-03-01 DIAGNOSIS — H18593 Other hereditary corneal dystrophies, bilateral: Secondary | ICD-10-CM | POA: Diagnosis not present

## 2022-03-08 DIAGNOSIS — H18593 Other hereditary corneal dystrophies, bilateral: Secondary | ICD-10-CM | POA: Diagnosis not present

## 2022-03-30 ENCOUNTER — Encounter: Payer: Self-pay | Admitting: Family Medicine

## 2022-03-30 ENCOUNTER — Ambulatory Visit: Payer: Medicare PPO | Admitting: Family Medicine

## 2022-03-30 DIAGNOSIS — I10 Essential (primary) hypertension: Secondary | ICD-10-CM | POA: Diagnosis not present

## 2022-03-30 NOTE — Progress Notes (Signed)
?Subjective:  ?  ? Patient ID: Hector Lewis, male   DOB: [birth year], 81 y.o.   MRN: [id number] ? ?HPI ? ?Seen for follow-up regarding hypertension.  Refer to last note for details.  We initiated losartan 50 mg daily.  Tolerating with no side effects.  Brings in a log of home readings.  Consistently 175Z to 025 systolic and 85I to 77O diastolic with occasional readings over 90.  No headaches.  No dizziness.  Exercises fairly regularly.  Careful to watch sodium intake.  His mother lived to be 19 and his dad was mid 42s and neither of them had known hypertension history. ? ?Past Medical History:  ?Diagnosis Date  ? Arthritis   ? history in knee prior to surgery  ? BUNION, LEFT FOOT 09/16/2008  ? Cancer [facility])   ? 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 Complication of anesthesia   ? had urinary retention after surgery cause elevated heart rate and severe abd pain once catheterized symptoms resolved  ? GERD 12/29/2010  ? History of colon polyps   ? HYPERLIPIDEMIA 05/30/2009  ? Hyperthyroidism 1980's  ? resolved now, took medicine at the time  ? MEDIAL MENISCUS TEAR, RIGHT 10/17/2008  ? METATARSALGIA 01/27/2010  ? 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  ? 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  ? White coat syndrome with hypertension   ? no medications  ? ?Past Surgical History:  ?Procedure Laterality Date  ? APPENDECTOMY    ? done with colectomy  ? COLON SURGERY  2008  ? precancerous polyps  ? COLONOSCOPY  05/2018  ? HAMMER TOE SURGERY  2012  ? INGUINAL HERNIA REPAIR N/A 06/22/2018  ? Procedure: LAPAROSCOPIC BILATERAL INGUINAL HERNIA REPAIR;  Surgeon: Michael Boston, MD;  [facility];  Service: General;  Laterality: N/A;  ? INSERTION OF MESH N/A 06/22/2018  ? Procedure: INSERTION OF MESH;  Surgeon: Michael Boston, MD;  [facility];  Service: General;  Laterality: N/A;  ? KNEE ARTHROSCOPY Right   ? PARTIAL COLECTOMY  2008  ? TONSILLECTOMY    ? 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 Procedure: TOTAL KNEE ARTHROPLASTY;  Surgeon: Dorna Leitz, MD;   Location: [facility];  Service: Orthopedics;  Laterality: Right;  ? ? reports that he quit smoking about 53 years ago. His smoking use included cigarettes. He has a 6.00 pack-year smoking history. He has never used smokeless tobacco. He reports current alcohol use. He reports that he does not use drugs. ?family history includes Cancer in his father; Hyperlipidemia in his mother. ?Allergies  ?Allergen Reactions  ? Codeine Sulfate Itching and Rash  ? ? ? ?Review of Systems  ?Constitutional:  Negative for fatigue and unexpected weight change.  ?Eyes:  Negative for visual disturbance.  ?Respiratory:  Negative for cough, chest tightness and shortness of breath.   ?Cardiovascular:  Negative for chest pain, palpitations and leg swelling.  ?Endocrine: Negative for polydipsia and polyuria.  ?Neurological:  Negative for dizziness, syncope, weakness, light-headedness and headaches.  ? ?   ?Objective:  ? Physical Exam ?Vitals reviewed.  ?Constitutional:   ?   Appearance: Normal appearance.  ?Cardiovascular:  ?   Rate and Rhythm: Normal rate and regular rhythm.  ?Pulmonary:  ?   Effort: Pulmonary effort is normal.  ?   Breath sounds: Normal breath sounds.  ?Musculoskeletal:  ?   Right lower leg: No edema.  ?   Left lower leg: No edema.  ?Neurological:  ?   Mental Status: He is alert.  ? ? ?   ?  Assessment:  ?   ?Essential hypertension improved from last visit after addition of losartan 50 mg daily but not to goal. ?   ?Plan:  ?   ?-Continue healthy lifestyle changes. ?-Continue weight loss efforts and regular aerobic exercise ?-He will double up his losartan to 100 mg daily and set up 1 month follow-up.  If not to goal at that time consider addition of low-dose HCTZ or possibly low-dose calcium channel blocker such as amlodipine. ? ?[name] MD ?[facility] ? ?   ?

## 2022-03-30 NOTE — Patient Instructions (Addendum)
Increase the Losartan to 100 mg daily ? ?Continue to monitor blood pressure and let's set up one month follow up.   ?

## 2022-04-04 ENCOUNTER — Other Ambulatory Visit: Payer: Self-pay | Admitting: Family Medicine

## 2022-04-14 ENCOUNTER — Ambulatory Visit: Payer: Medicare PPO | Admitting: Podiatry

## 2022-04-14 ENCOUNTER — Encounter: Payer: Self-pay | Admitting: Podiatry

## 2022-04-14 DIAGNOSIS — L600 Ingrowing nail: Secondary | ICD-10-CM

## 2022-04-14 NOTE — Patient Instructions (Signed)

## 2022-04-15 ENCOUNTER — Ambulatory Visit (INDEPENDENT_AMBULATORY_CARE_PROVIDER_SITE_OTHER): Payer: Medicare PPO

## 2022-04-15 VITALS — Ht 70.0 in | Wt 194.0 lb

## 2022-04-15 DIAGNOSIS — Z0000 Encounter for general adult medical examination without abnormal findings: Secondary | ICD-10-CM

## 2022-04-15 NOTE — Progress Notes (Signed)
? ?Subjective:  ? Hector Lewis is a 81 y.o. male who presents for Medicare Annual/Subsequent preventive examination. ? ?Review of Systems    ?Virtual Visit via Telephone Note ? ?I connected with  Hector Lewis on 04/15/22 at 10:30 AM EDT by telephone and verified that I am speaking with the correct person using two identifiers. ? ?Location: ?Patient: Home ?Provider: Office ?Persons participating in the virtual visit: patient/Nurse Health Advisor ?  ?I discussed the limitations, risks, security and privacy concerns of performing an evaluation and management service by telephone and the availability of in person appointments. The patient expressed understanding and agreed to proceed. ? ?Interactive audio and video telecommunications were attempted between this nurse and patient, however failed, due to patient having technical difficulties OR patient did not have access to video capability.  We continued and completed visit with audio only. ? ?Some vital signs may be absent or patient reported.  ? ?Criselda Peaches, LPN  ?Cardiac Risk Factors include: advanced age (>34mn, >>36women);hypertension;male gender ? ?   ?Objective:  ?  ?Today's Vitals  ? 04/15/22 1030  ?Weight: 194 lb (88 kg)  ?Height: '5\' 10"'$  (1.778 m)  ? ?Body mass index is 27.84 kg/m?. ? ? ?  04/15/2022  ? 10:41 AM 04/14/2021  ? 11:12 AM 02/06/2020  ?  1:21 PM 06/12/2018  ?  1:26 PM 11/09/2017  ?  9:35 AM 09/23/2017  ?  2:23 PM 07/30/2016  ?  1:28 PM  ?Advanced Directives  ?Does Patient Have a Medical Advance Directive? Yes No Yes Yes Yes Yes Yes  ?Type of AParamed[place]of [place] will  Living will;Healthcare Power of [place]Living will [place] will    ?Does patient want to make changes to medical advance directive? No - Patient declined  No - Patient declined No - Patient declined No - Patient declined    ?Copy of [place] Chart? No - copy  requested  No - copy requested No - copy requested     ?Would patient like information on creating a medical advance directive?  No - Patient declined       ? ? ?Current Medications (verified) ?Outpatient Encounter Medications as of 04/15/2022  ?Medication Sig  ? atorvastatin (LIPITOR) 20 MG tablet TAKE 1 TABLET(20 MG) BY MOUTH DAILY  ? losartan (COZAAR) 100 MG tablet Take 100 mg by mouth daily.  ? Multiple Vitamin (MULTIVITAMIN) capsule Take 1 capsule by mouth daily.  ? Omega-3 Fatty Acids (FISH OIL) 1200 MG CAPS Take 1,200 mg by mouth 2 (two) times daily.  ? pantoprazole (PROTONIX) 40 MG tablet TAKE 1 TABLET BY MOUTH EVERY DAY  ? Polyvinyl Alcohol-Povidone (REFRESH OP) Place 1 drop into both eyes daily as needed (dry eyes).  ? Probiotic Product (ULTRAFLORA IMMUNE HEALTH PO) Take 1 capsule by mouth daily.   ? ?No facility-administered encounter medications on file as of 04/15/2022.  ? ? ?Allergies (verified) ?Codeine sulfate  ? ?History: ?Past Medical History:  ?Diagnosis Date  ? Arthritis   ? history in knee prior to surgery  ? BUNION, LEFT FOOT 09/16/2008  ? 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 Complication of anesthesia   ? had urinary retention after surgery cause elevated heart rate and severe abd pain once catheterized symptoms resolved  ? GERD 12/29/2010  ? History of colon polyps   ? HYPERLIPIDEMIA 05/30/2009  ? Hyperthyroidism 1980's  ? resolved now, took  medicine at the time  ? MEDIAL MENISCUS TEAR, RIGHT 10/17/2008  ? METATARSALGIA 01/27/2010  ? 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  ? 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  ? White coat syndrome with hypertension   ? no medications  ? ?Past Surgical History:  ?Procedure Laterality Date  ? APPENDECTOMY    ? done with colectomy  ? COLON SURGERY  2008  ? precancerous polyps  ? COLONOSCOPY  05/2018  ? EYE SURGERY    ? HAMMER TOE SURGERY  2012  ? INGUINAL HERNIA REPAIR N/A 06/22/2018  ? Procedure: LAPAROSCOPIC BILATERAL INGUINAL HERNIA REPAIR;  Surgeon: Michael Boston, MD;  Location:  [facility];  Service: General;  Laterality: N/A;  ? INSERTION OF MESH N/A 06/22/2018  ? Procedure: INSERTION OF MESH;  Surgeon: Michael Boston, MD;  Location: [facility];  Service: General;  Laterality: N/A;  ? KNEE ARTHROSCOPY Right   ? PARTIAL COLECTOMY  2008  ? TONSILLECTOMY    ? 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 Procedure: TOTAL KNEE ARTHROPLASTY;  Surgeon: Dorna Leitz, MD;  Location: [place];  Service: Orthopedics;  Laterality: Right;  ? ?Family History  ?Problem Relation Age of Onset  ? Hyperlipidemia Mother   ? Cancer Father   ?     colon, prostate  ? ?Social History  ? ?Socioeconomic History  ? Marital status: Married  ?  Spouse name: Not on file  ? Number of children: Not on file  ? Years of education: Not on file  ? Highest education level: Master's degree (e.g., MA, MS, MEng, MEd, MSW, MBA)  ?Occupational History  ? Not on file  ?Tobacco Use  ? Smoking status: Former  ?  Packs/day: 1.00  ?  Years: 6.00  ?  Pack years: 6.00  ?  Types: Cigarettes  ?  Quit date: 06/09/1968  ?  Years since quitting: 53.8  ? Smokeless tobacco: Never  ? Tobacco comments:  ?  quit at 81 yo   ?Vaping Use  ? Vaping Use: Never used  ?Substance and Sexual Activity  ? Alcohol use: Yes  ?  Comment: 2 glasses per night weekends only  ? Drug use: No  ? Sexual activity: Not on file  ?Other Topics Concern  ? Not on file  ?Social History Narrative  ? Not on file  ? ?Social Determinants of Health  ? ?Financial Resource Strain: Low Risk   ? Difficulty of Paying Living Expenses: Not hard at all  ?Food Insecurity: No Food Insecurity  ? Worried About Charity fundraiser in the Last Year: Never true  ? Ran Out of Food in the Last Year: Never true  ?Transportation Needs: No Transportation Needs  ? Lack of Transportation (Medical): No  ? Lack of Transportation (Non-Medical): No  ?Physical Activity: Sufficiently Active  ? Days of Exercise per Week: 4 days  ? Minutes of Exercise per Session: 60 min  ?Stress: No Stress Concern Present  ? Feeling of  Stress : Not at all  ?Social Connections: Socially Integrated  ? Frequency of Communication with Friends and Family: More than three times a week  ? Frequency of Social Gatherings with Friends and Family: More than three times a week  ? Attends Religious Services: More than 4 times per year  ? Active Member of Clubs or Organizations: Yes  ? Attends Archivist Meetings: More than 4 times per year  ? Marital Status: Married  ? ? ?Clinical Intake: ? ?Pre-visit preparation completed: Yes ? ?Pain : No/denies pain ? ?  ? ?  BMI - recorded: 27.53 ?Nutritional Status: BMI 25 -29 Overweight ?Nutritional Risks: None ?Diabetes: No ? ?How often do you need to have someone help you when you read instructions, pamphlets, or other written materials from your doctor or pharmacy?: 1 - Never ? ?Diabetic?  No ? ? ?Activities of Daily Living ? ?  04/15/2022  ? 10:40 AM 04/14/2022  ?  3:38 PM  ?In your present state of health, do you have any difficulty performing the following activities:  ?Hearing? 0 0  ?Vision? 0 0  ?Difficulty concentrating or making decisions? 0 0  ?Walking or climbing stairs? 0 0  ?Dressing or bathing? 0 0  ?Doing errands, shopping? 0 0  ?Preparing Food and eating ? N N  ?Using the Toilet? N N  ?In the past six months, have you accidently leaked urine? N N  ?Do you have problems with loss of bowel control? N N  ?Managing your Medications? N N  ?Managing your Finances? N N  ?Housekeeping or managing your Housekeeping? N N  ? ? ?Patient Care Team: ?[name], MD as PCP - General ?Michael Boston, MD as Consulting Physician (General Surgery) ?Juanita Craver, MD as Consulting Physician (Gastroenterology) ?Dorna Leitz, MD as Consulting Physician (Orthopedic Surgery) ?Juluis Rainier as Consulting Physician (Optometry) ? ?Indicate any recent Medical Services you may have received from other than [facility] providers in the past year (date may be approximate). ? ?   ?Assessment:  ? This is a routine wellness  examination for Ramy. ? ?Hearing/Vision screen ?Hearing Screening - Comments:: No hearing difficulty ?Vision Screening - Comments:: Wears reading glasses. Followed by Dr Idolina Primer ? ?Dietary issues and exercise activit

## 2022-04-15 NOTE — Patient Instructions (Addendum)
?Mr. Macpherson , ?Thank you for taking time to come for your Medicare Wellness Visit. I appreciate your ongoing commitment to your health goals. Please review the following plan we discussed and let me know if I can assist you in the future.  ? ?These are the goals we discussed: ? Goals   ? ?   patient (pt-stated)   ?   Will continue to try to eat well. ?  ?   patient   ?   2 drinks per night w food is the limit  ? ? ? ?  ? ?  ?  ?This is a list of the screening recommended for you and due dates:  ?Health Maintenance  ?Topic Date Due  ? Pneumonia Vaccine (3 - PPSV23 if available, else PCV20) 01/11/2015  ? COVID-19 Vaccine (4 - Booster for Pfizer series) 05/01/2022*  ? Flu Shot  07/13/2022  ? Colon Cancer Screening  05/16/2023  ? Tetanus Vaccine  01/18/2026  ? Zoster (Shingles) Vaccine  Completed  ? HPV Vaccine  Aged Out  ?*Topic was postponed. The date shown is not the original due date.  ? ?Advanced directives: yes Copies on file ? ?Conditions/risks identified: None ? ?Next appointment: Follow up in one year for your annual wellness visit.  ? ?Preventive Care 31 Years and Older, Male ?Preventive care refers to lifestyle choices and visits with your health care provider that can promote health and wellness. ?What does preventive care include? ?A yearly physical exam. This is also called an annual well check. ?Dental exams once or twice a year. ?Routine eye exams. Ask your health care provider how often you should have your eyes checked. ?Personal lifestyle choices, including: ?Daily care of your teeth and gums. ?Regular physical activity. ?Eating a healthy diet. ?Avoiding tobacco and drug use. ?Limiting alcohol use. ?Practicing safe sex. ?Taking low doses of aspirin every day. ?Taking vitamin and mineral supplements as recommended by your health care provider. ?What happens during an annual well check? ?The services and screenings done by your health care provider during your annual well check will depend on your  age, overall health, lifestyle risk factors, and family history of disease. ?Counseling  ?Your health care provider may ask you questions about your: ?Alcohol use. ?Tobacco use. ?Drug use. ?Emotional well-being. ?Home and relationship well-being. ?Sexual activity. ?Eating habits. ?History of falls. ?Memory and ability to understand (cognition). ?Work and work Statistician. ?Screening  ?You may have the following tests or measurements: ?Height, weight, and BMI. ?Blood pressure. ?Lipid and cholesterol levels. These may be checked every 5 years, or more frequently if you are over 75 years old. ?Skin check. ?Lung cancer screening. You may have this screening every year starting at age 61 if you have a 30-pack-year history of smoking and currently smoke or have quit within the past 15 years. ?Fecal occult blood test (FOBT) of the stool. You may have this test every year starting at age 47. ?Flexible sigmoidoscopy or colonoscopy. You may have a sigmoidoscopy every 5 years or a colonoscopy every 10 years starting at age 14. ?Prostate cancer screening. Recommendations will vary depending on your family history and other risks. ?Hepatitis C blood test. ?Hepatitis B blood test. ?Sexually transmitted disease (STD) testing. ?Diabetes screening. This is done by checking your blood sugar (glucose) after you have not eaten for a while (fasting). You may have this done every 1-3 years. ?Abdominal aortic aneurysm (AAA) screening. You may need this if you are a current or former smoker. ?Osteoporosis. You  may be screened starting at age 34 if you are at high risk. ?Talk with your health care provider about your test results, treatment options, and if necessary, the need for more tests. ?Vaccines  ?Your health care provider may recommend certain vaccines, such as: ?Influenza vaccine. This is recommended every year. ?Tetanus, diphtheria, and acellular pertussis (Tdap, Td) vaccine. You may need a Td booster every 10 years. ?Zoster  vaccine. You may need this after age 53. ?Pneumococcal 13-valent conjugate (PCV13) vaccine. One dose is recommended after age 33. ?Pneumococcal polysaccharide (PPSV23) vaccine. One dose is recommended after age 80. ?Talk to your health care provider about which screenings and vaccines you need and how often you need them. ?This information is not intended to replace advice given to you by your health care provider. Make sure you discuss any questions you have with your health care provider. ?Document Released: 12/26/2015 Document Revised: 08/18/2016 Document Reviewed: 09/30/2015 ?Elsevier Interactive Patient Education ? 2017 [employer]. ? ?Fall Prevention in the Home ?Falls can cause injuries. They can happen to people of all ages. There are many things you can do to make your home safe and to help prevent falls. ?What can I do on the outside of my home? ?Regularly fix the edges of walkways and driveways and fix any cracks. ?Remove anything that might make you trip as you walk through a door, such as a raised step or threshold. ?Trim any bushes or trees on the path to your home. ?Use bright outdoor lighting. ?Clear any walking paths of anything that might make someone trip, such as rocks or tools. ?Regularly check to see if handrails are loose or broken. Make sure that both sides of any steps have handrails. ?Any raised decks and porches should have guardrails on the edges. ?Have any leaves, snow, or ice cleared regularly. ?Use sand or salt on walking paths during winter. ?Clean up any spills in your garage right away. This includes oil or grease spills. ?What can I do in the bathroom? ?Use night lights. ?Install grab bars by the toilet and in the tub and shower. Do not use towel bars as grab bars. ?Use non-skid mats or decals in the tub or shower. ?If you need to sit down in the shower, use a plastic, non-slip stool. ?Keep the floor dry. Clean up any water that spills on the floor as soon as it happens. ?Remove  soap buildup in the tub or shower regularly. ?Attach bath mats securely with double-sided non-slip rug tape. ?Do not have throw rugs and other things on the floor that can make you trip. ?What can I do in the bedroom? ?Use night lights. ?Make sure that you have a light by your bed that is easy to reach. ?Do not use any sheets or blankets that are too big for your bed. They should not hang down onto the floor. ?Have a firm chair that has side arms. You can use this for support while you get dressed. ?Do not have throw rugs and other things on the floor that can make you trip. ?What can I do in the kitchen? ?Clean up any spills right away. ?Avoid walking on wet floors. ?Keep items that you use a lot in easy-to-reach places. ?If you need to reach something above you, use a strong step stool that has a grab bar. ?Keep electrical cords out of the way. ?Do not use floor polish or wax that makes floors slippery. If you must use wax, use non-skid floor wax. ?Do  not have throw rugs and other things on the floor that can make you trip. ?What can I do with my stairs? ?Do not leave any items on the stairs. ?Make sure that there are handrails on both sides of the stairs and use them. Fix handrails that are broken or loose. Make sure that handrails are as long as the stairways. ?Check any carpeting to make sure that it is firmly attached to the stairs. Fix any carpet that is loose or worn. ?Avoid having throw rugs at the top or bottom of the stairs. If you do have throw rugs, attach them to the floor with carpet tape. ?Make sure that you have a light switch at the top of the stairs and the bottom of the stairs. If you do not have them, ask someone to add them for you. ?What else can I do to help prevent falls? ?Wear shoes that: ?Do not have high heels. ?Have rubber bottoms. ?Are comfortable and fit you well. ?Are closed at the toe. Do not wear sandals. ?If you use a stepladder: ?Make sure that it is fully opened. Do not climb a  closed stepladder. ?Make sure that both sides of the stepladder are locked into place. ?Ask someone to hold it for you, if possible. ?Clearly mark and make sure that you can see: ?Any grab bars or handrails. ?Fi

## 2022-04-15 NOTE — Progress Notes (Signed)
Subjective:  ? ?Patient ID: Hector Lewis, male   DOB: 81 y.o.   MRN: [id number]  ? ?HPI ?Patient presents stating he has spicules of toenails on the third and fourth right which are becoming painful and he like to have them removed.  The rest the nails are doing well ? ? ?ROS ? ? ?   ?Objective:  ?Physical Exam  ?Neurovascular status intact with splintered nails third and fourth right lateral side painful when pressed ? ?   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 ?   ?Plan:  ?Reviewed condition recommended permanent removal explained procedure risk and patient wants them removed.  I allowed her to read a consent form and then he signed it after review and at this point I infiltrated each digit with 60 mg Xylocaine Marcaine mixture sterile prep done and using sterile instrumentation I remove the spicule on the third nail to remove the spicule on the fourth nail exposed the matrix applied phenol for applications 30 seconds followed by alcohol lavage sterile dressing gave instructions on soaks reappoint to recheck ?   ? ? ?

## 2022-04-19 ENCOUNTER — Telehealth: Payer: Self-pay | Admitting: Family Medicine

## 2022-04-19 MED ORDER — LOSARTAN POTASSIUM 100 MG PO TABS: 100.0000 mg | ORAL_TABLET | Freq: Every day | ORAL | 3 refills | Status: DC

## 2022-04-19 NOTE — Telephone Encounter (Signed)
Pt requesting refill of  losartan (COZAAR) 100 MG tablet  to be sent to  ?[facility] [place], [address] Phone:  [phone]  ?Fax:  [phone]  ?  ?He has ran out of his '50mg'$  ?

## 2022-04-19 NOTE — Telephone Encounter (Signed)
Rx sent 

## 2022-04-27 ENCOUNTER — Ambulatory Visit: Payer: Medicare PPO | Admitting: Family Medicine

## 2022-04-27 ENCOUNTER — Encounter: Payer: Self-pay | Admitting: Family Medicine

## 2022-04-27 VITALS — BP 142/72 | HR 84 | Temp 97.5°F | Ht 70.0 in | Wt 194.1 lb

## 2022-04-27 DIAGNOSIS — I10 Essential (primary) hypertension: Secondary | ICD-10-CM | POA: Diagnosis not present

## 2022-04-27 MED ORDER — AMLODIPINE BESYLATE 2.5 MG PO TABS: 2.5000 mg | ORAL_TABLET | Freq: Every day | ORAL | 3 refills | Status: DC

## 2022-04-27 NOTE — Progress Notes (Signed)
? ?Established Patient Office Visit ? ?Subjective   ?Patient ID: Hector Lewis, male    DOB: 1941/09/04  Age: 81 y.o. MRN: [id number] ? ?Chief Complaint  ?Patient presents with  ? Follow-up  ? ? ?HPI ? ? ?Here for follow-up regarding hypertension.  We had recently bumped his losartan dosage from 50 to 100 mg daily.  Compliant with therapy.  Brings in log of home readings.  These are some improved however, he still had some systolic readings between 140 and 564 and diastolics mostly 33I.  No headaches.  No dizziness.  No side effects from medication.  No orthostatic symptoms.  Stays fairly active with exercise. ? ?Past Medical History:  ?Diagnosis Date  ? Arthritis   ? history in knee prior to surgery  ? BUNION, LEFT FOOT 09/16/2008  ? Cancer [facility])   ? 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 Complication of anesthesia   ? had urinary retention after surgery cause elevated heart rate and severe abd pain once catheterized symptoms resolved  ? GERD 12/29/2010  ? History of colon polyps   ? HYPERLIPIDEMIA 05/30/2009  ? Hyperthyroidism 1980's  ? resolved now, took medicine at the time  ? MEDIAL MENISCUS TEAR, RIGHT 10/17/2008  ? METATARSALGIA 01/27/2010  ? 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  ? 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  ? White coat syndrome with hypertension   ? no medications  ? ?Past Surgical History:  ?Procedure Laterality Date  ? APPENDECTOMY    ? done with colectomy  ? COLON SURGERY  2008  ? precancerous polyps  ? COLONOSCOPY  05/2018  ? EYE SURGERY    ? HAMMER TOE SURGERY  2012  ? INGUINAL HERNIA REPAIR N/A 06/22/2018  ? Procedure: LAPAROSCOPIC BILATERAL INGUINAL HERNIA REPAIR;  Surgeon: Michael Boston, MD;  Location[facility];  Service: General;  Laterality: N/A;  ? INSERTION OF MESH N/A 06/22/2018  ? Procedure: INSERTION OF MESH;  Surgeon: Michael Boston, MD;  Location[facility];  Service: General;  Laterality: N/A;  ? KNEE ARTHROSCOPY Right   ? PARTIAL COLECTOMY  2008  ? TONSILLECTOMY    ? 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  Procedure: TOTAL KNEE ARTHROPLASTY;  Surgeon: Dorna Leitz, MD;  Location: [place];  Service: Orthopedics;  Laterality: Right;  ? ? reports that he quit smoking about 53 years ago. His smoking use included cigarettes. He has a 6.00 pack-year smoking history. He has never used smokeless tobacco. He reports current alcohol use. He reports that he does not use drugs. ?family history includes Cancer in his father; Hyperlipidemia in his mother. ?Allergies  ?Allergen Reactions  ? Codeine Sulfate Itching and Rash  ? ?' ?Review of Systems  ?Respiratory:  Negative for shortness of breath.   ?Cardiovascular:  Negative for chest pain.  ?Neurological:  Negative for dizziness and headaches.  ? ?  ?Objective:  ?  ? ?BP (!) 142/72 (BP Location: Left Arm, Patient Position: Sitting, Cuff Size: Normal)   Pulse 84   Temp (!) 97.5 ?F (36.4 ?C) (Oral)   Ht '5\' 10"'$  (1.778 m)   Wt 194 lb 1.6 oz (88 kg)   SpO2 97%   BMI 27.85 kg/m?  ? ? ?Physical Exam ?Vitals reviewed.  ?Constitutional:   ?   Appearance: Normal appearance.  ?Cardiovascular:  ?   Rate and Rhythm: Normal rate and regular rhythm.  ?Pulmonary:  ?   Effort: Pulmonary effort is normal.  ?   Breath sounds: Normal breath sounds.  ?Musculoskeletal:  ?  Cervical back: Neck supple.  ?   Right lower leg: No edema.  ?   Left lower leg: No edema.  ?Lymphadenopathy:  ?   Cervical: No cervical adenopathy.  ?Neurological:  ?   Mental Status: He is alert.  ? ? ? ?No results found for any visits on 04/27/22. ? ? ? ?The ASCVD Risk score (Arnett DK, et al., 2019) failed to calculate for the following reasons: ?  The 2019 ASCVD risk score is only valid for ages 47 to 4 ? ?  ?Assessment & Plan:  ? ?Hypertension slightly improved but still suboptimal control.  Multiple systolic readings over 827.  Continue losartan 100 mg daily.  Add amlodipine 2.5 mg daily.  Continue low-sodium diet.  Continue regular exercise.  Set up 1 month follow-up. ? ? ?Return in about 1 month (around 05/28/2022).   ? ? ?Carolann Littler, MD ? ?

## 2022-05-22 ENCOUNTER — Other Ambulatory Visit: Payer: Self-pay | Admitting: Family Medicine

## 2022-05-28 ENCOUNTER — Ambulatory Visit: Payer: Medicare PPO | Admitting: Family Medicine

## 2022-05-28 ENCOUNTER — Encounter: Payer: Self-pay | Admitting: Family Medicine

## 2022-05-28 VITALS — BP 130/68 | HR 62 | Temp 97.6°F | Ht 70.0 in | Wt 192.4 lb

## 2022-05-28 DIAGNOSIS — I10 Essential (primary) hypertension: Secondary | ICD-10-CM

## 2022-05-28 NOTE — Patient Instructions (Signed)
Go ahead and increase the Amlodipine to 5 mg daily.   Let me know if BP not consistently < 140/90 a couple of weeks after change.

## 2022-05-28 NOTE — Progress Notes (Signed)
Established Patient Office Visit  Subjective   Patient ID: Hector Lewis, male    DOB: 08/26/1941  Age: 81 y.o. MRN: [id number]  Chief Complaint  Patient presents with   Follow-up    HPI   Follow-up hypertension.  Last visit we added amlodipine 2.5 mg daily.  Tolerating well with no edema or other side effect.  Also remains on losartan 100 mg daily.  He is staying very active with exercise and particularly pickleball.  Brings in log of home readings.  He has had some readings 258N systolic but still several 140s and even some 150 range.  No headaches.  No dizziness.  No chest pains.  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   White coat syndrome with hypertension    no medications   Past Surgical History:  Procedure Laterality Date   APPENDECTOMY     done with colectomy   COLON SURGERY  2008   precancerous polyps   COLONOSCOPY  05/2018   EYE SURGERY     HAMMER TOE SURGERY  2012   INGUINAL HERNIA REPAIR N/A 06/22/2018   Procedure: LAPAROSCOPIC BILATERAL INGUINAL HERNIA REPAIR;  Surgeon: Michael Boston, MD;  Location: [facility];  Service: General;  Laterality: N/A;   INSERTION OF MESH N/A 06/22/2018   Procedure: INSERTION OF MESH;  Surgeon: Michael Boston, MD;  Location: [facility];  Service: General;  Laterality: N/A;   KNEE ARTHROSCOPY Right    PARTIAL COLECTOMY  2008   TONSILLECTOMY     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   Procedure: TOTAL  KNEE ARTHROPLASTY;  Surgeon: Dorna Leitz, MD;  Location: [facility];  Service: Orthopedics;  Laterality: Right;    reports that he quit smoking about 54 years ago. His smoking use included cigarettes. He has a 6.00 pack-year smoking history. He has never used smokeless tobacco. He reports current alcohol use. He reports that he does not use drugs. family history includes Cancer in his father; Hyperlipidemia in his mother. Allergies  Allergen Reactions   Codeine Sulfate Itching and Rash    Review of Systems  Constitutional:  Negative for malaise/fatigue.  Eyes:  Negative for blurred vision.  Respiratory:  Negative for shortness of breath.   Cardiovascular:  Negative for chest pain.  Neurological:  Negative for dizziness, weakness and headaches.      Objective:     BP 130/68 (BP Location: Left Arm, Patient Position: Sitting, Cuff Size: Normal)   Pulse 62   Temp 97.6 F (36.4 C) (Oral)   Ht '5\' 10"'$  (1.778 m)   Wt 192 lb 6.4 oz (87.3 kg)   SpO2 97%   BMI 27.61 kg/m    Physical Exam Vitals reviewed.  Constitutional:      Appearance: Normal appearance.  Cardiovascular:     Rate and Rhythm: Normal rate and regular rhythm.  Pulmonary:     Effort: Pulmonary effort is normal.  Breath sounds: Normal breath sounds.  Musculoskeletal:     Right lower leg: No edema.     Left lower leg: No edema.  Neurological:     Mental Status: He is alert.      No results found for any visits on 05/28/22.    The ASCVD Risk score (Arnett DK, et al., 2019) failed to calculate for the following reasons:   The 2019 ASCVD risk score is only valid for ages 60 to 35    Assessment & Plan:   Hypertension slightly improved but still has several readings over 242 systolic.  Titrate amlodipine up to 5 mg daily and continue losartan 100 mg daily.  Continue regular exercise habits and watch sodium intake closely.  -Be in touch if home readings not consistently less than 683 systolic over the next few  weeks -Set up follow-up for yearly physical otherwise  No follow-ups on file.    Carolann Littler, MD

## 2022-06-14 ENCOUNTER — Telehealth: Payer: Self-pay | Admitting: Family Medicine

## 2022-06-14 NOTE — Telephone Encounter (Signed)
Pt requesting prescription for amLODipine (NORVASC) 5 MG tablet be sent to  [facility], [place] AT [address] [place] Phone:  [phone]  Fax:  [phone]

## 2022-06-16 MED ORDER — AMLODIPINE BESYLATE 5 MG PO TABS: 5.0000 mg | ORAL_TABLET | Freq: Every day | ORAL | 3 refills | Status: DC

## 2022-06-16 NOTE — Telephone Encounter (Signed)
Rx done. 

## 2022-07-08 DIAGNOSIS — M7541 Impingement syndrome of right shoulder: Secondary | ICD-10-CM | POA: Diagnosis not present

## 2022-07-08 DIAGNOSIS — M67811 Other specified disorders of synovium, right shoulder: Secondary | ICD-10-CM | POA: Diagnosis not present

## 2022-09-04 ENCOUNTER — Telehealth: Payer: Medicare PPO | Admitting: Nurse Practitioner

## 2022-09-04 DIAGNOSIS — U071 COVID-19: Secondary | ICD-10-CM | POA: Diagnosis not present

## 2022-09-04 MED ORDER — MOLNUPIRAVIR EUA 200MG CAPSULE: 4.0000 | ORAL_CAPSULE | Freq: Two times a day (BID) | ORAL | 0 refills | Status: AC

## 2022-09-04 NOTE — Progress Notes (Signed)
Virtual Visit Consent   Hector Lewis, you are scheduled for a virtual visit with Mary-Margaret Hassell Done, [place], a [facility] provider, today.     Just as with appointments in the office, your consent must be obtained to participate.  Your consent will be active for this visit and any virtual visit you may have with one of our providers in the next 365 days.     If you have a MyChart account, a copy of this consent can be sent to you electronically.  All virtual visits are billed to your insurance company just like a traditional visit in the office.    As this is a virtual visit, video technology does not allow for your provider to perform a traditional examination.  This may limit your provider's ability to fully assess your condition.  If your provider identifies any concerns that need to be evaluated in person or the need to arrange testing (such as labs, EKG, etc.), we will make arrangements to do so.     Although advances in technology are sophisticated, we cannot ensure that it will always work on either your end or our end.  If the connection with a video visit is poor, the visit may have to be switched to a telephone visit.  With either a video or telephone visit, we are not always able to ensure that we have a secure connection.     I need to obtain your verbal consent now.   Are you willing to proceed with your visit today? YES   Hector Lewis has provided verbal consent on 09/04/2022 for a virtual visit (video or telephone).   Mary-Margaret Hassell Done, FNP   Date: 09/04/2022 8:44 AM   Virtual Visit via Video Note   I, Mary-Margaret Hassell Done, connected with Hector Lewis ([id number], 1941/06/04) on 09/04/22 at  8:45 AM EDT by a video-enabled telemedicine application and verified that I am speaking with the correct person using two identifiers.  Location: Patient: Virtual Visit Location Patient: Home Provider: Virtual Visit Location Provider: Mobile   I discussed the  limitations of evaluation and management by telemedicine and the availability of in person appointments. The patient expressed understanding and agreed to proceed.    History of Present Illness: Hector Lewis is a 81 y.o. who identifies as a male who was assigned male at birth, and is being seen today for covid positive.  HPI: Patietn just returned from [place] this past MOnday.  URI  This is a new problem. Episode onset: wednesday. The problem has been waxing and waning. The maximum temperature recorded prior to his arrival was 100.4 - 100.9 F. The fever has been present for 1 to 2 days. Associated symptoms include congestion and rhinorrhea. Pertinent negatives include no headaches or sinus pain. Sore throat: slight.He has tried acetaminophen for the symptoms. The treatment provided mild relief.    Review of Systems  HENT:  Positive for congestion and rhinorrhea. Negative for sinus pain. Sore throat: slight.  Neurological:  Negative for headaches.    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    Allergies:  Allergies  Allergen Reactions   Codeine Sulfate Itching and Rash   Medications:  Current Outpatient Medications:    amLODipine (NORVASC) 5 MG tablet, Take 5 mg by mouth daily., Disp: , Rfl:    amLODipine (NORVASC) 5 MG tablet, Take 1 tablet (5 mg total) by mouth daily., Disp: 90 tablet, Rfl: 3   atorvastatin (LIPITOR)  20 MG tablet, TAKE 1 TABLET(20 MG) BY MOUTH DAILY, Disp: 90 tablet, Rfl: 3   losartan (COZAAR) 100 MG tablet, Take 1 tablet (100 mg total) by mouth daily., Disp: 90 tablet, Rfl: 3   Multiple Vitamin (MULTIVITAMIN) capsule, Take 1 capsule by mouth daily., Disp: , Rfl:    Omega-3 Fatty Acids (FISH OIL) 1200 MG CAPS, Take 1,200 mg by mouth 2 (two) times daily., Disp: , Rfl:    pantoprazole (PROTONIX) 40 MG tablet, TAKE 1 TABLET BY MOUTH EVERY DAY, Disp: 90 tablet, Rfl: 1   Polyvinyl Alcohol-Povidone (REFRESH OP), Place 1 drop into both eyes daily as needed (dry eyes)., Disp: , Rfl:    Probiotic Product ([place]), Take 1 capsule by mouth daily. , Disp: , Rfl:   Observations/Objective: Patient is well-developed, well-nourished in no acute distress.  Resting comfortably  at home.  Head is normocephalic, atraumatic.  No labored breathing.  Speech is clear and coherent with logical content.  Patient is alert and oriented at baseline.  Raspy voice  Assessment and Plan: Hector Lewis in today with chief complaint of Covid Positive   1. Positive self-administered antigen test for COVID-19 1. Take meds as prescribed 2. Use a cool mist humidifier especially during the winter months and when heat has been humid. 3. Use saline nose sprays frequently 4. Saline irrigations of the nose can be very helpful if done frequently.  * 4X daily for 1 week*  * Use of a nettie pot can be helpful with this. Follow directions with this* 5. Drink plenty of fluids 6. Keep thermostat turn down low 7.For any cough or congestion- mucinex or delsym 8. For fever or aces or pains- take tylenol or ibuprofen appropriate for age and weight.  * for fevers greater than 101 orally you may alternate ibuprofen and tylenol every  3 hours.    Meds ordered this encounter  Medications   molnupiravir EUA (LAGEVRIO) 200 mg CAPS capsule    Sig: Take 4 capsules (800 mg total) by mouth 2 (two) times daily for  5 days.    Dispense:  40 capsule    Refill:  0    Order Specific Question:   Supervising Provider    Answer:   Chase Picket [id number]      Follow Up Instructions: I discussed the assessment and treatment plan with the patient. The patient was provided an opportunity to ask questions and all were answered. The patient agreed with the plan and demonstrated an understanding of the instructions.  A copy of instructions were sent to the patient via MyChart.  The patient was advised to call back or seek an in-person evaluation if the symptoms worsen or if the condition fails to improve as anticipated.  Time:  I spent 6 minutes with the patient via telehealth technology discussing the above problems/concerns.    Mary-Margaret Hassell Done, FNP

## 2022-09-04 NOTE — Patient Instructions (Signed)
1. Take meds as prescribed 2. Use a cool mist humidifier especially during the winter months and when heat has been humid. 3. Use saline nose sprays frequently 4. Saline irrigations of the nose can be very helpful if done frequently.  * 4X daily for 1 week*  * Use of a nettie pot can be helpful with this. Follow directions with this* 5. Drink plenty of fluids 6. Keep thermostat turn down low 7.For any cough or congestion- delsym, or mucinex 8. For fever or aces or pains- take tylenol or ibuprofen appropriate for age and weight.  * for fevers greater than 101 orally you may alternate ibuprofen and tylenol every  3 hours.

## 2022-10-21 DIAGNOSIS — L570 Actinic keratosis: Secondary | ICD-10-CM | POA: Diagnosis not present

## 2022-10-21 DIAGNOSIS — L821 Other seborrheic keratosis: Secondary | ICD-10-CM | POA: Diagnosis not present

## 2022-10-21 DIAGNOSIS — D225 Melanocytic nevi of trunk: Secondary | ICD-10-CM | POA: Diagnosis not present

## 2022-10-21 DIAGNOSIS — Z85828 Personal history of other malignant neoplasm of skin: Secondary | ICD-10-CM | POA: Diagnosis not present

## 2022-10-21 DIAGNOSIS — D2271 Melanocytic nevi of right lower limb, including hip: Secondary | ICD-10-CM | POA: Diagnosis not present

## 2022-10-21 DIAGNOSIS — D1801 Hemangioma of skin and subcutaneous tissue: Secondary | ICD-10-CM | POA: Diagnosis not present

## 2022-11-28 ENCOUNTER — Other Ambulatory Visit: Payer: Self-pay | Admitting: Family Medicine

## 2022-12-14 DIAGNOSIS — H40053 Ocular hypertension, bilateral: Secondary | ICD-10-CM | POA: Diagnosis not present

## 2022-12-14 DIAGNOSIS — H40013 Open angle with borderline findings, low risk, bilateral: Secondary | ICD-10-CM | POA: Diagnosis not present

## 2022-12-30 DIAGNOSIS — M13831 Other specified arthritis, right wrist: Secondary | ICD-10-CM | POA: Diagnosis not present

## 2022-12-30 DIAGNOSIS — M79644 Pain in right finger(s): Secondary | ICD-10-CM | POA: Diagnosis not present

## 2022-12-30 DIAGNOSIS — M79645 Pain in left finger(s): Secondary | ICD-10-CM | POA: Diagnosis not present

## 2022-12-30 DIAGNOSIS — R52 Pain, unspecified: Secondary | ICD-10-CM | POA: Diagnosis not present

## 2022-12-30 DIAGNOSIS — M13841 Other specified arthritis, right hand: Secondary | ICD-10-CM | POA: Diagnosis not present

## 2023-03-01 ENCOUNTER — Ambulatory Visit (INDEPENDENT_AMBULATORY_CARE_PROVIDER_SITE_OTHER): Payer: Medicare PPO | Admitting: Family Medicine

## 2023-03-01 ENCOUNTER — Encounter: Payer: Self-pay | Admitting: Family Medicine

## 2023-03-01 VITALS — BP 126/72 | HR 70 | Temp 97.8°F | Ht 70.08 in | Wt 195.6 lb

## 2023-03-01 DIAGNOSIS — I10 Essential (primary) hypertension: Secondary | ICD-10-CM | POA: Diagnosis not present

## 2023-03-01 DIAGNOSIS — Z0000 Encounter for general adult medical examination without abnormal findings: Secondary | ICD-10-CM | POA: Diagnosis not present

## 2023-03-01 DIAGNOSIS — E785 Hyperlipidemia, unspecified: Secondary | ICD-10-CM

## 2023-03-01 LAB — CBC WITH DIFFERENTIAL/PLATELET
Basophils Absolute: 0.1 10*3/uL (ref 0.0–0.1)
Basophils Relative: 0.9 % (ref 0.0–3.0)
Eosinophils Absolute: 0.2 10*3/uL (ref 0.0–0.7)
Eosinophils Relative: 4 % (ref 0.0–5.0)
HCT: 45 % (ref 39.0–52.0)
Hemoglobin: 15.9 g/dL (ref 13.0–17.0)
Lymphocytes Relative: 24.6 % (ref 12.0–46.0)
Lymphs Abs: 1.4 10*3/uL (ref 0.7–4.0)
MCHC: 35.2 g/dL (ref 30.0–36.0)
MCV: 93.5 fl (ref 78.0–100.0)
Monocytes Absolute: 0.4 10*3/uL (ref 0.1–1.0)
Monocytes Relative: 7.4 % (ref 3.0–12.0)
Neutro Abs: 3.7 10*3/uL (ref 1.4–7.7)
Neutrophils Relative %: 63.1 % (ref 43.0–77.0)
Platelets: 189 10*3/uL (ref 150.0–400.0)
RBC: 4.82 Mil/uL (ref 4.22–5.81)
RDW: 12.9 % (ref 11.5–15.5)
WBC: 5.8 10*3/uL (ref 4.0–10.5)

## 2023-03-01 LAB — LIPID PANEL
Cholesterol: 157 mg/dL (ref 0–200)
HDL: 43.9 mg/dL (ref >39.00)
LDL Cholesterol: 87 mg/dL (ref 0–99)
NonHDL: 113.47
Total CHOL/HDL Ratio: 4
Triglycerides: 132 mg/dL (ref 0.0–149.0)
VLDL: 26.4 mg/dL (ref 0.0–40.0)

## 2023-03-01 LAB — HEPATIC FUNCTION PANEL
ALT: 28 U/L (ref 0–53)
AST: 25 U/L (ref 0–37)
Albumin: 4.2 g/dL (ref 3.5–5.2)
Alkaline Phosphatase: 76 U/L (ref 39–117)
Bilirubin, Direct: 0.1 mg/dL (ref 0.0–0.3)
Total Bilirubin: 0.7 mg/dL (ref 0.2–1.2)
Total Protein: 7.2 g/dL (ref 6.0–8.3)

## 2023-03-01 LAB — BASIC METABOLIC PANEL
BUN: 11 mg/dL (ref 6–23)
CO2: 25 mEq/L (ref 19–32)
Calcium: 9.3 mg/dL (ref 8.4–10.5)
Chloride: 105 mEq/L (ref 96–112)
Creatinine, Ser: 0.97 mg/dL (ref 0.40–1.50)
GFR: 73.51 mL/min (ref >60.00)
Glucose, Bld: 101 mg/dL — ABNORMAL HIGH (ref 70–99)
Potassium: 3.9 mEq/L (ref 3.5–5.1)
Sodium: 139 mEq/L (ref 135–145)

## 2023-03-01 MED ORDER — ATORVASTATIN CALCIUM 20 MG PO TABS: ORAL_TABLET | ORAL | 3 refills | Status: DC

## 2023-03-01 MED ORDER — PANTOPRAZOLE SODIUM 40 MG PO TBEC: 40.0000 mg | DELAYED_RELEASE_TABLET | Freq: Every day | ORAL | 3 refills | Status: DC

## 2023-03-01 MED ORDER — LOSARTAN POTASSIUM 100 MG PO TABS: 100.0000 mg | ORAL_TABLET | Freq: Every day | ORAL | 3 refills | Status: DC

## 2023-03-01 MED ORDER — AMLODIPINE BESYLATE 5 MG PO TABS: 5.0000 mg | ORAL_TABLET | Freq: Every day | ORAL | 3 refills | Status: DC

## 2023-03-01 NOTE — Progress Notes (Signed)
Established Patient Office Visit  Subjective   Patient ID: Hector Lewis, male    DOB: 1941-11-05  Age: 82 y.o. MRN: [id number]  Chief Complaint  Patient presents with   Annual Exam    HPI   Hector Lewis is here for physical exam.  He has chronic problems including history of hypertension, GERD, hyperlipidemia, osteoarthritis.  He had previous right knee surgery.  Doing generally well.  No longer plays pickle ball.  Does still exercise some.  He has had some bilateral tinnitus but no acute hearing loss.  Occasional vertigo symptoms.  No pulsatile tinnitus.  No recent hearing check.  He has family history of colon cancer in his father.  He is due for repeat colonoscopy this year-if GI agrees to do this once more given his age  He does need several medications refilled today including pantoprazole, amlodipine, losartan, and atorvastatin.  Health maintenance reviewed  -Vaccines all up-to-date -Colonoscopy due this summer -Aged out of further PSA screening  Family history-mother lived to be 5.  She had hyperlipidemia.  Father had colon cancer and prostate cancer.  He has a brother who is 71 who is alive and well.  Social history-married.  1 daughter and 1 granddaughter.  Quit smoking 1969.  Rare alcohol use.  He retired several years ago.  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   White coat syndrome with hypertension    no medications   Past  Surgical History:  Procedure Laterality Date   APPENDECTOMY     done with colectomy   COLON SURGERY  2008   precancerous polyps   COLONOSCOPY  05/2018   EYE SURGERY     HAMMER TOE SURGERY  2012   INGUINAL HERNIA REPAIR N/A 06/22/2018   Procedure: LAPAROSCOPIC BILATERAL INGUINAL HERNIA REPAIR;  Surgeon: Michael Boston, MD;  [facility];  Service: General;  Laterality: N/A;   INSERTION OF MESH N/A 06/22/2018   Procedure: INSERTION OF MESH;  Surgeon: Michael Boston, MD;  Location[facility];  Service: General;  Laterality: N/A;   KNEE ARTHROSCOPY Right    PARTIAL COLECTOMY  2008   TONSILLECTOMY     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   Procedure: TOTAL KNEE ARTHROPLASTY;  Surgeon: Dorna Leitz, MD;  Location: [facility];  Service: Orthopedics;  Laterality: Right;    reports that he quit smoking about 54 years ago. His smoking use included cigarettes. He has a 6.00 pack-year smoking history. He has never used smokeless tobacco. He reports current alcohol use. He reports that he does not use drugs. family history includes Cancer in his father; Hyperlipidemia in his mother. Allergies  Allergen Reactions   Codeine Sulfate Itching and Rash    Review of Systems  Constitutional:  Negative for chills, fever, malaise/fatigue and weight loss.  HENT:  Positive for tinnitus. Negative for ear discharge.   Eyes:  Negative for blurred vision  and double vision.  Respiratory:  Negative for cough and shortness of breath.   Cardiovascular:  Negative for chest pain, palpitations and leg swelling.  Gastrointestinal:  Negative for abdominal pain, blood in stool, constipation and diarrhea.  Genitourinary:  Negative for dysuria.  Skin:  Negative for rash.  Neurological:  Negative for dizziness, speech change, seizures, loss of consciousness and headaches.  Psychiatric/Behavioral:  Negative for depression.       Objective:     BP 126/72 (BP Location: Left Arm, Patient Position: Sitting, Cuff Size:  Normal)   Pulse 70   Temp 97.8 F (36.6 C) (Oral)   Ht 5' 10.08" (1.78 m)   Wt 195 lb 9.6 oz (88.7 kg)   SpO2 96%   BMI 28.00 kg/m  BP Readings from Last 3 Encounters:  03/01/23 126/72  05/28/22 130/68  04/27/22 (!) 142/72   Wt Readings from Last 3 Encounters:  03/01/23 195 lb 9.6 oz (88.7 kg)  05/28/22 192 lb 6.4 oz (87.3 kg)  04/27/22 194 lb 1.6 oz (88 kg)      Physical Exam Vitals reviewed.  Constitutional:      General: He is not in acute distress.    Appearance: He is well-developed.  HENT:     Head: Normocephalic and atraumatic.     Right Ear: External ear normal.     Left Ear: External ear normal.  Eyes:     Pupils: Pupils are equal, round, and reactive to light.  Neck:     Thyroid: No thyromegaly.  Cardiovascular:     Rate and Rhythm: Normal rate and regular rhythm.     Heart sounds: Normal heart sounds.  Pulmonary:     Effort: No respiratory distress.     Breath sounds: No wheezing or rales.  Abdominal:     General: Bowel sounds are normal. There is no distension.     Palpations: Abdomen is soft. There is no mass.     Tenderness: There is no abdominal tenderness. There is no guarding or rebound.  Musculoskeletal:     Cervical back: Normal range of motion and neck supple.     Right lower leg: No edema.     Left lower leg: No edema.  Lymphadenopathy:     Cervical: No cervical adenopathy.  Skin:    Findings: No rash.  Neurological:     Mental Status: He is alert and oriented to person, place, and time.     Cranial Nerves: No cranial nerve deficit.      No results found for any visits on 03/01/23.    The ASCVD Risk score (Arnett DK, et al., 2019) failed to calculate for the following reasons:   The 2019 ASCVD risk score is only valid for ages 7 to 82    Assessment & Plan:   Physical exam.  He has controlled blood pressure and history of hyperlipidemia and GERD which is controlled with pantoprazole.  We discussed the following health  maintenance items  -Vaccines currently up-to-date -Continue annual flu vaccine -He plans to contact GI this summer regarding whether he will get follow-up colonoscopy. 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    Carolann Littler, MD

## 2023-03-08 ENCOUNTER — Encounter: Payer: Self-pay | Admitting: Family Medicine

## 2023-03-08 ENCOUNTER — Ambulatory Visit: Payer: Medicare PPO | Admitting: Family Medicine

## 2023-03-08 VITALS — BP 134/76 | HR 66 | Temp 97.8°F | Wt 195.0 lb

## 2023-03-08 DIAGNOSIS — R1031 Right lower quadrant pain: Secondary | ICD-10-CM

## 2023-03-08 NOTE — Progress Notes (Signed)
   Subjective:    Patient ID: Hector Lewis, male    DOB: 05-Nov-1941, 82 y.o.   MRN: [id number]  HPI Here for 2 weeks of a sharp intermittent pain in the right groin. No recent trauma, but he attends an exercise class several days a week where he does lunges, squats, and other exercises. He denies any urinary symptoms. His BM's are regular. On 06-22-18 he had bilateral laparoscopic inguinal hernia repairs with mesh placements per Dr. Michael Boston.    Review of Systems  Constitutional: Negative.   Respiratory: Negative.    Cardiovascular: Negative.   Gastrointestinal: Negative.   Genitourinary:  Negative for dysuria, flank pain, frequency, hematuria, testicular pain and urgency.       Objective:   Physical Exam Constitutional:      General: He is not in acute distress.    Appearance: Normal appearance.  Cardiovascular:     Rate and Rhythm: Normal rate and regular rhythm.     Pulses: Normal pulses.     Heart sounds: Normal heart sounds.  Pulmonary:     Effort: Pulmonary effort is normal.     Breath sounds: Normal breath sounds.  Abdominal:     General: Abdomen is flat. Bowel sounds are normal. There is no distension.     Palpations: Abdomen is soft. There is no mass.     Tenderness: There is no guarding or rebound.     Hernia: No hernia is present.     Comments: He is tender in the right inguinal area. No masses felt   Genitourinary:    Penis: Normal.      Testes: Normal.  Neurological:     Mental Status: He is alert.           Assessment & Plan:  Right inguinal pain consistent with a loose mesh. Refer to Dr. Johney Maine for a surgical consult.  Alysia Penna, MD

## 2023-03-15 DIAGNOSIS — L309 Dermatitis, unspecified: Secondary | ICD-10-CM | POA: Diagnosis not present

## 2023-03-15 DIAGNOSIS — D485 Neoplasm of uncertain behavior of skin: Secondary | ICD-10-CM | POA: Diagnosis not present

## 2023-03-15 DIAGNOSIS — Z85828 Personal history of other malignant neoplasm of skin: Secondary | ICD-10-CM | POA: Diagnosis not present

## 2023-03-15 DIAGNOSIS — L820 Inflamed seborrheic keratosis: Secondary | ICD-10-CM | POA: Diagnosis not present

## 2023-04-05 DIAGNOSIS — S76211A Strain of adductor muscle, fascia and tendon of right thigh, initial encounter: Secondary | ICD-10-CM | POA: Diagnosis not present

## 2023-04-05 DIAGNOSIS — Z8719 Personal history of other diseases of the digestive system: Secondary | ICD-10-CM | POA: Diagnosis not present

## 2023-04-18 ENCOUNTER — Ambulatory Visit (INDEPENDENT_AMBULATORY_CARE_PROVIDER_SITE_OTHER): Payer: Medicare PPO

## 2023-04-18 VITALS — Ht 70.0 in | Wt 195.0 lb

## 2023-04-18 DIAGNOSIS — Z0000 Encounter for general adult medical examination without abnormal findings: Secondary | ICD-10-CM | POA: Diagnosis not present

## 2023-04-18 NOTE — Patient Instructions (Addendum)
Hector Lewis , Thank you for taking time to come for your Medicare Wellness Visit. I appreciate your ongoing commitment to your health goals. Please review the following plan we discussed and let me know if I can assist you in the future.   These are the goals we discussed:  Goals       patient (pt-stated)      Will continue to try to eat well.      patient      2 drinks per night w food is the limit         Patient stated (pt-stated)      Continue to be positive with my wife and friends.        This is a list of the screening recommended for you and due dates:  Health Maintenance  Topic Date Due   COVID-19 Vaccine (4 - 2023-24 season) 05/04/2023*   Colon Cancer Screening  05/16/2023   Flu Shot  07/14/2023   Medicare Annual Wellness Visit  04/17/2024   DTaP/Tdap/Td vaccine (4 - Td or Tdap) 01/18/2026   Zoster (Shingles) Vaccine  Completed   HPV Vaccine  Aged Out   Pneumonia Vaccine  Discontinued  *Topic was postponed. The date shown is not the original due date.    Advanced directives: In Chart  Conditions/risks identified: None  Next appointment: Follow up in one year for your annual wellness visit.    Preventive Care 51 Years and Older, Male  Preventive care refers to lifestyle choices and visits with your health care provider that can promote health and wellness. What does preventive care include? A yearly physical exam. This is also called an annual well check. Dental exams once or twice a year. Routine eye exams. Ask your health care provider how often you should have your eyes checked. Personal lifestyle choices, including: Daily care of your teeth and gums. Regular physical activity. Eating a healthy diet. Avoiding tobacco and drug use. Limiting alcohol use. Practicing safe sex. Taking low doses of aspirin every day. Taking vitamin and mineral supplements as recommended by your health care provider. What happens during an annual well check? The services  and screenings done by your health care provider during your annual well check will depend on your age, overall health, lifestyle risk factors, and family history of disease. Counseling  Your health care provider may ask you questions about your: Alcohol use. Tobacco use. Drug use. Emotional well-being. Home and relationship well-being. Sexual activity. Eating habits. History of falls. Memory and ability to understand (cognition). Work and work Astronomer. Screening  You may have the following tests or measurements: Height, weight, and BMI. Blood pressure. Lipid and cholesterol levels. These may be checked every 5 years, or more frequently if you are over 81 years old. Skin check. Lung cancer screening. You may have this screening every year starting at age 81 if you have a 30-pack-year history of smoking and currently smoke or have quit within the past 15 years. Fecal occult blood test (FOBT) of the stool. You may have this test every year starting at age 81. Flexible sigmoidoscopy or colonoscopy. You may have a sigmoidoscopy every 5 years or a colonoscopy every 10 years starting at age 95. Prostate cancer screening. Recommendations will vary depending on your family history and other risks. Hepatitis C blood test. Hepatitis B blood test. Sexually transmitted disease (STD) testing. Diabetes screening. This is done by checking your blood sugar (glucose) after you have not eaten for a while (fasting). You  may have this done every 81 years. Abdominal aortic aneurysm (AAA) screening. You may need this if you are a current or former smoker. Osteoporosis. You may be screened starting at age 81 if you are at high risk. Talk with your health care provider about your test results, treatment options, and if necessary, the need for more tests. Vaccines  Your health care provider may recommend certain vaccines, such as: Influenza vaccine. This is recommended every year. Tetanus, diphtheria,  and acellular pertussis (Tdap, Td) vaccine. You may need a Td booster every 10 years. Zoster vaccine. You may need this after age 20. Pneumococcal 13-valent conjugate (PCV13) vaccine. One dose is recommended after age 81. Pneumococcal polysaccharide (PPSV23) vaccine. One dose is recommended after age 81. Talk to your health care provider about which screenings and vaccines you need and how often you need them. This information is not intended to replace advice given to you by your health care provider. Make sure you discuss any questions you have with your health care provider. Document Released: 12/26/2015 Document Revised: 08/18/2016 Document Reviewed: 09/30/2015 Elsevier Interactive Patient Education  2017 [employer].  Fall Prevention in the Home Falls can cause injuries. They can happen to people of all ages. There are many things you can do to make your home safe and to help prevent falls. What can I do on the outside of my home? Regularly fix the edges of walkways and driveways and fix any cracks. Remove anything that might make you trip as you walk through a door, such as a raised step or threshold. Trim any bushes or trees on the path to your home. Use bright outdoor lighting. Clear any walking paths of anything that might make someone trip, such as rocks or tools. Regularly check to see if handrails are loose or broken. Make sure that both sides of any steps have handrails. Any raised decks and porches should have guardrails on the edges. Have any leaves, snow, or ice cleared regularly. Use sand or salt on walking paths during winter. Clean up any spills in your garage right away. This includes oil or grease spills. What can I do in the bathroom? Use night lights. Install grab bars by the toilet and in the tub and shower. Do not use towel bars as grab bars. Use non-skid mats or decals in the tub or shower. If you need to sit down in the shower, use a plastic, non-slip  stool. Keep the floor dry. Clean up any water that spills on the floor as soon as it happens. Remove soap buildup in the tub or shower regularly. Attach bath mats securely with double-sided non-slip rug tape. Do not have throw rugs and other things on the floor that can make you trip. What can I do in the bedroom? Use night lights. Make sure that you have a light by your bed that is easy to reach. Do not use any sheets or blankets that are too big for your bed. They should not hang down onto the floor. Have a firm chair that has side arms. You can use this for support while you get dressed. Do not have throw rugs and other things on the floor that can make you trip. What can I do in the kitchen? Clean up any spills right away. Avoid walking on wet floors. Keep items that you use a lot in easy-to-reach places. If you need to reach something above you, use a strong step stool that has a grab bar. Keep electrical  cords out of the way. Do not use floor polish or wax that makes floors slippery. If you must use wax, use non-skid floor wax. Do not have throw rugs and other things on the floor that can make you trip. What can I do with my stairs? Do not leave any items on the stairs. Make sure that there are handrails on both sides of the stairs and use them. Fix handrails that are broken or loose. Make sure that handrails are as long as the stairways. Check any carpeting to make sure that it is firmly attached to the stairs. Fix any carpet that is loose or worn. Avoid having throw rugs at the top or bottom of the stairs. If you do have throw rugs, attach them to the floor with carpet tape. Make sure that you have a light switch at the top of the stairs and the bottom of the stairs. If you do not have them, ask someone to add them for you. What else can I do to help prevent falls? Wear shoes that: Do not have high heels. Have rubber bottoms. Are comfortable and fit you well. Are closed at the  toe. Do not wear sandals. If you use a stepladder: Make sure that it is fully opened. Do not climb a closed stepladder. Make sure that both sides of the stepladder are locked into place. Ask someone to hold it for you, if possible. Clearly mark and make sure that you can see: Any grab bars or handrails. First and last steps. Where the edge of each step is. Use tools that help you move around (mobility aids) if they are needed. These include: Canes. Walkers. Scooters. Crutches. Turn on the lights when you go into a dark area. Replace any light bulbs as soon as they burn out. Set up your furniture so you have a clear path. Avoid moving your furniture around. If any of your floors are uneven, fix them. If there are any pets around you, be aware of where they are. Review your medicines with your doctor. Some medicines can make you feel dizzy. This can increase your chance of falling. Ask your doctor what other things that you can do to help prevent falls. This information is not intended to replace advice given to you by your health care provider. Make sure you discuss any questions you have with your health care provider. Document Released: 09/25/2009 Document Revised: 05/06/2016 Document Reviewed: 01/03/2015 Elsevier Interactive Patient Education  2017 [employer].

## 2023-04-18 NOTE — Progress Notes (Signed)
Subjective:   Hector Lewis is a 82 y.o. male who presents for Medicare Annual/Subsequent preventive examination.  Review of Systems    Virtual Visit via Telephone Note  I connected with  Hector Lewis on 04/20/23 at 12:30 PM EDT by telephone and verified that I am speaking with the correct person using two identifiers.  Location: Patient: Home Provider: Office Persons participating in the virtual visit: patient/Nurse Health Advisor   I discussed the limitations, risks, security and privacy concerns of performing an evaluation and management service by telephone and the availability of in person appointments. The patient expressed understanding and agreed to proceed.  Interactive audio and video telecommunications were attempted between this nurse and patient, however failed, due to patient having technical difficulties OR patient did not have access to video capability.  We continued and completed visit with audio only.  Some vital signs may be absent or patient reported.   [name], LPN  Cardiac Risk Factors include: advanced age (>20men, >54 women);male gender;hypertension     Objective:    Today's Vitals   04/18/23 1239  Weight: 195 lb (88.5 kg)  Height: 5\' 10"  (1.778 m)   Body mass index is 27.98 kg/m.     04/18/2023   12:47 PM 04/15/2022   10:41 AM 04/14/2021   11:12 AM 02/06/2020    1:21 PM 06/12/2018    1:26 PM 11/09/2017    9:35 AM 09/23/2017    2:23 PM  Advanced Directives  Does Patient Have a Medical Advance Directive? Yes Yes No Yes Yes Yes Yes  Type of Estate agent of [place];Living will Healthcare Power of [place];Living will  Living will;Healthcare Power of [employer] Power of [place];Living will Healthcare Power of [place];Living will   Does patient want to make changes to medical advance directive? No - Patient declined No - Patient declined  No - Patient declined No - Patient declined No - Patient declined    Copy of Healthcare Power of Attorney in Chart? Yes - validated most recent copy scanned in chart (See row information) No - copy requested  No - copy requested No - copy requested    Would patient like information on creating a medical advance directive?   No - Patient declined        Current Medications (verified) Outpatient Encounter Medications as of 04/18/2023  Medication Sig   amLODipine (NORVASC) 5 MG tablet Take 1 tablet (5 mg total) by mouth daily.   atorvastatin (LIPITOR) 20 MG tablet TAKE 1 TABLET(20 MG) BY MOUTH DAILY   losartan (COZAAR) 100 MG tablet Take 1 tablet (100 mg total) by mouth daily.   Multiple Vitamin (MULTIVITAMIN) capsule Take 1 capsule by mouth daily.   Omega-3 Fatty Acids (FISH OIL) 1200 MG CAPS Take 1,200 mg by mouth 2 (two) times daily.   pantoprazole (PROTONIX) 40 MG tablet Take 1 tablet (40 mg total) by mouth daily.   Polyvinyl Alcohol-Povidone (REFRESH OP) Place 1 drop into both eyes daily as needed (dry eyes).   Probiotic Product (ULTRAFLORA IMMUNE HEALTH PO) Take 1 capsule by mouth daily.    No facility-administered encounter medications on file as of 04/18/2023.    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   Complication of anesthesia    had urinary retention  after surgery cause elevated heart rate and severe abd pain once catheterized symptoms resolved   GERD 12/29/2010   History of colon polyps    HYPERLIPIDEMIA 05/30/2009   Hyperthyroidism 1980's   resolved now, took medicine at the time   MEDIAL MENISCUS TEAR, RIGHT 10/17/2008   METATARSALGIA 01/27/2010   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   White coat syndrome with hypertension    no medications   Past Surgical History:  Procedure Laterality Date   APPENDECTOMY     done with colectomy   COLON SURGERY   2008   precancerous polyps   COLONOSCOPY  05/2018   EYE SURGERY     HAMMER TOE SURGERY  2012   INGUINAL HERNIA REPAIR N/A 06/22/2018   Procedure: LAPAROSCOPIC BILATERAL INGUINAL HERNIA REPAIR;  Surgeon: Karie Soda, MD;  Location: [facility];  Service: General;  Laterality: N/A;   INSERTION OF MESH N/A 06/22/2018   Procedure: INSERTION OF MESH;  Surgeon: Karie Soda, MD;  Location: [facility];  Service: General;  Laterality: N/A;   KNEE ARTHROSCOPY Right    PARTIAL COLECTOMY  2008   TONSILLECTOMY     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   Procedure: TOTAL KNEE ARTHROPLASTY;  Surgeon: Jodi Geralds, MD;  Location: [facility];  Service: Orthopedics;  Laterality: Right;   Family History  Problem Relation Age of Onset   Hyperlipidemia Mother    Cancer Father        colon, prostate   Social History   Socioeconomic History   Marital status: Married    Spouse name: Not on file   Number of children: Not on file   Years of education: Not on file   Highest education level: Master's degree (e.g., MA, MS, MEng, MEd, MSW, MBA)  Occupational History   Not on file  Tobacco Use   Smoking status: Former    Packs/day: 1.00    Years: 6.00    Additional pack years: 0.00    Total pack years: 6.00    Types: Cigarettes    Quit date: 06/09/1968    Years since quitting: 54.8   Smokeless tobacco: Never   Tobacco comments:    quit at 82 yo   Vaping Use   Vaping Use: Never used  Substance and Sexual Activity   Alcohol use: Yes    Comment: 2 glasses per night weekends only   Drug use: No   Sexual activity: Not on file  Other Topics Concern   Not on file  Social History Narrative   Not on file   Social Determinants of Health   Financial Resource Strain: Low Risk  (04/18/2023)   Overall Financial Resource Strain (CARDIA)    Difficulty of Paying Living Expenses: Not hard at all  Food Insecurity: No Food Insecurity (04/18/2023)   Hunger Vital Sign    Worried About Running Out of Food in the Last  Year: Never true    Ran Out of Food in the Last Year: Never true  Transportation Needs: No Transportation Needs (04/18/2023)   PRAPARE - Administrator, Civil Service (Medical): No    Lack of Transportation (Non-Medical): No  Physical Activity: Sufficiently Active (04/18/2023)   Exercise Vital Sign    Days of Exercise per Week: 7 days    Minutes of Exercise per Session: 60 min  Stress: No Stress Concern Present (04/18/2023)   [employer] of Occupational Health - Occupational Stress Questionnaire    Feeling of Stress :  Not at all  Social Connections: Socially Integrated (04/18/2023)   Social Connection and Isolation Panel [NHANES]    Frequency of Communication with Friends and Family: More than three times a week    Frequency of Social Gatherings with Friends and Family: More than three times a week    Attends Religious Services: More than 4 times per year    Active Member of [employer] or Organizations: Yes    Attends Engineer, structural: More than 4 times per year    Marital Status: Married    Tobacco Counseling Counseling given: Not Answered Tobacco comments: quit at 82 yo    Clinical Intake:  Pre-visit preparation completed: Yes  Pain : No/denies pain     BMI - recorded: 27.98 Nutritional Status: BMI 25 -29 Overweight Nutritional Risks: None Diabetes: No  How often do you need to have someone help you when you read instructions, pamphlets, or other written materials from your doctor or pharmacy?: 1 - Never  Diabetic?  No  Interpreter Needed?: No  Information entered by :: Mckinley Jewel LPN   Activities of Daily Living    04/18/2023   12:44 PM 04/14/2023    8:47 AM  In your present state of health, do you have any difficulty performing the following activities:  Hearing? 0 0  Vision? 0 0  Difficulty concentrating or making decisions? 0 0  Walking or climbing stairs? 0 0  Dressing or bathing? 0 0  Doing errands, shopping? 0 0  Preparing Food  and eating ? N N  Using the Toilet? N N  In the past six months, have you accidently leaked urine? N N  Do you have problems with loss of bowel control? N N  Managing your Medications? N N  Managing your Finances? N N  Housekeeping or managing your Housekeeping? N N    Patient Care Team: Kristian Covey, MD as PCP - Erline Hau, MD as Consulting Physician (General Surgery) Charna Elizabeth, MD as Consulting Physician (Gastroenterology) Jodi Geralds, MD as Consulting Physician (Orthopedic Surgery) Davina Poke as Consulting Physician (Optometry)  Indicate any recent Medical Services you may have received from other than [facility] providers in the past year (date may be approximate).     Assessment:   This is a routine wellness examination for Brady.  Hearing/Vision screen Hearing Screening - Comments:: Denies hearing difficulties   Vision Screening - Comments:: Wears reading glasses - up to date with routine eye exams with  Dr Shea Evans  Dietary issues and exercise activities discussed: Current Exercise Habits: Home exercise routine, Type of exercise: walking, Time (Minutes): 60, Frequency (Times/Week): 7, Weekly Exercise (Minutes/Week): 420, Intensity: Moderate, Exercise limited by: None identified   Goals Addressed               This Visit's Progress     Patient stated (pt-stated)        Continue to be positive with my wife and friends.       Depression Screen    04/18/2023   12:43 PM 03/08/2023   10:35 AM 03/01/2023    8:27 AM 04/15/2022   10:38 AM 04/14/2021   11:14 AM 02/17/2021    8:54 AM 02/06/2020    1:22 PM  PHQ 2/9 Scores  PHQ - 2 Score 0 0 0 0 0 0 0  PHQ- 9 Score 0 0         Fall Risk    04/18/2023   12:45 PM 04/14/2023  8:47 AM 03/08/2023   10:34 AM 03/01/2023    8:26 AM 04/15/2022   10:41 AM  Fall Risk   Falls in the past year? 0 0 0 0 0  Number falls in past yr: 0  0 0 0  Injury with Fall? 0 0 0 0 0  Risk for fall due to : No Fall Risks  No Fall  Risks No Fall Risks No Fall Risks  Follow up Falls prevention discussed  Falls evaluation completed Falls evaluation completed     FALL RISK PREVENTION PERTAINING TO THE HOME:  Any stairs in or around the home? Yes  If so, are there any without handrails? No  Home free of loose throw rugs in walkways, pet beds, electrical cords, etc? Yes  Adequate lighting in your home to reduce risk of falls? Yes   ASSISTIVE DEVICES UTILIZED TO PREVENT FALLS:  Life alert? No  Use of a cane, walker or w/c? No  Grab bars in the bathroom? Yes  Shower chair or bench in shower? No  Elevated toilet seat or a handicapped toilet? No   TIMED UP AND GO:   Was the test performed? No . Audio Visit   Cognitive Function:        04/20/2023    8:09 AM 04/15/2022   10:42 AM 02/06/2020    1:22 PM  6CIT Screen  What Year? 0 points 0 points 0 points  What month? 0 points 0 points 0 points  What time? 0 points 0 points 0 points  Count back from 20 0 points 0 points 0 points  Months in reverse 0 points 0 points 0 points  Repeat phrase 0 points 0 points 0 points  Total Score 0 points 0 points 0 points    Immunizations Immunization History  Administered Date(s) Administered   Fluad Quad(high Dose 65+) 08/17/2019, 08/12/2022   Influenza Split 09/02/2011, 10/01/2012, 10/01/2013, 09/12/2014   Influenza Whole 09/12/2008, 09/12/2009, 09/11/2010   Influenza, High Dose Seasonal PF 09/23/2015, 09/14/2017, 08/25/2018   Influenza-Unspecified 09/28/2016, 07/29/2018, 08/17/2019, 08/13/2020, 08/27/2021   PFIZER(Purple Top)SARS-COV-2 Vaccination 01/17/2020, 02/12/2020, 06/12/2020   Pneumococcal Conjugate-13 01/11/2014   Pneumococcal Polysaccharide-23 12/13/2006   Td 12/14/1995, 03/13/2006   Tdap 01/19/2016   Zoster Recombinat (Shingrix) 04/22/2017, 06/12/2017   Zoster, Live 12/13/2004    TDAP status: Up to date  Flu Vaccine status: Up to date  Pneumococcal vaccine status: Up to date  Covid-19 vaccine status:  Completed vaccines  Qualifies for Shingles Vaccine? Yes   Zostavax completed Yes   Shingrix Completed?: Yes  Screening Tests Health Maintenance  Topic Date Due   COVID-19 Vaccine (4 - 2023-24 season) 05/04/2023 (Originally 08/13/2022)   COLONOSCOPY (Pts 45-16yrs Insurance coverage will need to be confirmed)  05/16/2023   INFLUENZA VACCINE  07/14/2023   Medicare Annual Wellness (AWV)  04/17/2024   DTaP/Tdap/Td (4 - Td or Tdap) 01/18/2026   Zoster Vaccines- Shingrix  Completed   HPV VACCINES  Aged Out   Pneumonia Vaccine 10+ Years old  Discontinued    Health Maintenance  There are no preventive care reminders to display for this patient.   Colorectal cancer screening: Type of screening: Colonoscopy. Completed 05/15/18. Repeat every 5 years  Lung Cancer Screening: (Low Dose CT Chest recommended if Age 47-80 years, 30 pack-year currently smoking OR have quit w/in 15years.) does not qualify.     Additional Screening:  Hepatitis C Screening: does not qualify; Completed   Vision Screening: Recommended annual ophthalmology exams for early detection of glaucoma  and other disorders of the eye. Is the patient up to date with their annual eye exam?  Yes  Who is the provider or what is the name of the office in which the patient attends annual eye exams? Dr Shea Evans If pt is not established with a provider, would they like to be referred to a provider to establish care? No .   Dental Screening: Recommended annual dental exams for proper oral hygiene  Community Resource Referral / Chronic Care Management:  CRR required this visit?  No   CCM required this visit?  No      Plan:     I have personally reviewed and noted the following in the patient's chart:   Medical and social history Use of alcohol, tobacco or illicit drugs  Current medications and supplements including opioid prescriptions. Patient is not currently taking opioid prescriptions. Functional ability and  status Nutritional status Physical activity Advanced directives List of other physicians Hospitalizations, surgeries, and ER visits in previous 12 months Vitals Screenings to include cognitive, depression, and falls Referrals and appointments  In addition, I have reviewed and discussed with patient certain preventive protocols, quality metrics, and best practice recommendations. A written personalized care plan for preventive services as well as general preventive health recommendations were provided to patient.     [name], LPN   4/0/9811   Nurse Notes: 6CIT was completed on 04/18/23 with score of 0

## 2023-06-23 DIAGNOSIS — K219 Gastro-esophageal reflux disease without esophagitis: Secondary | ICD-10-CM | POA: Diagnosis not present

## 2023-06-23 DIAGNOSIS — Z86010 Personal history of colonic polyps: Secondary | ICD-10-CM | POA: Diagnosis not present

## 2023-06-23 DIAGNOSIS — Z800 Family history of malignant neoplasm of digestive organs: Secondary | ICD-10-CM | POA: Diagnosis not present

## 2023-06-23 DIAGNOSIS — Z1211 Encounter for screening for malignant neoplasm of colon: Secondary | ICD-10-CM | POA: Diagnosis not present

## 2023-07-06 DIAGNOSIS — Z86010 Personal history of colonic polyps: Secondary | ICD-10-CM | POA: Diagnosis not present

## 2023-07-06 DIAGNOSIS — D123 Benign neoplasm of transverse colon: Secondary | ICD-10-CM | POA: Diagnosis not present

## 2023-07-06 DIAGNOSIS — Z800 Family history of malignant neoplasm of digestive organs: Secondary | ICD-10-CM | POA: Diagnosis not present

## 2023-07-06 DIAGNOSIS — K5730 Diverticulosis of large intestine without perforation or abscess without bleeding: Secondary | ICD-10-CM | POA: Diagnosis not present

## 2023-07-06 DIAGNOSIS — D124 Benign neoplasm of descending colon: Secondary | ICD-10-CM | POA: Diagnosis not present

## 2023-07-06 DIAGNOSIS — K635 Polyp of colon: Secondary | ICD-10-CM | POA: Diagnosis not present

## 2023-07-06 DIAGNOSIS — Z980 Intestinal bypass and anastomosis status: Secondary | ICD-10-CM | POA: Diagnosis not present

## 2023-07-06 DIAGNOSIS — Z1211 Encounter for screening for malignant neoplasm of colon: Secondary | ICD-10-CM | POA: Diagnosis not present

## 2023-07-06 DIAGNOSIS — D125 Benign neoplasm of sigmoid colon: Secondary | ICD-10-CM | POA: Diagnosis not present

## 2023-07-06 LAB — HM COLONOSCOPY

## 2023-07-07 ENCOUNTER — Encounter: Payer: Self-pay | Admitting: Family Medicine

## 2023-07-12 DIAGNOSIS — L0100 Impetigo, unspecified: Secondary | ICD-10-CM | POA: Diagnosis not present

## 2023-07-12 DIAGNOSIS — L309 Dermatitis, unspecified: Secondary | ICD-10-CM | POA: Diagnosis not present

## 2023-07-12 DIAGNOSIS — L0889 Other specified local infections of the skin and subcutaneous tissue: Secondary | ICD-10-CM | POA: Diagnosis not present

## 2023-07-12 DIAGNOSIS — Z85828 Personal history of other malignant neoplasm of skin: Secondary | ICD-10-CM | POA: Diagnosis not present

## 2023-09-06 DIAGNOSIS — H40053 Ocular hypertension, bilateral: Secondary | ICD-10-CM | POA: Diagnosis not present

## 2023-09-06 DIAGNOSIS — H40013 Open angle with borderline findings, low risk, bilateral: Secondary | ICD-10-CM | POA: Diagnosis not present

## 2023-10-25 DIAGNOSIS — B078 Other viral warts: Secondary | ICD-10-CM | POA: Diagnosis not present

## 2023-10-25 DIAGNOSIS — L309 Dermatitis, unspecified: Secondary | ICD-10-CM | POA: Diagnosis not present

## 2023-10-25 DIAGNOSIS — L821 Other seborrheic keratosis: Secondary | ICD-10-CM | POA: Diagnosis not present

## 2023-10-25 DIAGNOSIS — Z85828 Personal history of other malignant neoplasm of skin: Secondary | ICD-10-CM | POA: Diagnosis not present

## 2024-02-21 ENCOUNTER — Other Ambulatory Visit: Payer: Self-pay | Admitting: Family Medicine

## 2024-02-22 ENCOUNTER — Other Ambulatory Visit: Payer: Self-pay | Admitting: Family Medicine

## 2024-03-06 ENCOUNTER — Encounter: Payer: Self-pay | Admitting: Family Medicine

## 2024-03-06 ENCOUNTER — Ambulatory Visit (INDEPENDENT_AMBULATORY_CARE_PROVIDER_SITE_OTHER): Payer: Medicare PPO | Admitting: Family Medicine

## 2024-03-06 VITALS — BP 124/68 | HR 81 | Temp 98.0°F | Ht 70.0 in | Wt 192.0 lb

## 2024-03-06 DIAGNOSIS — I10 Essential (primary) hypertension: Secondary | ICD-10-CM | POA: Diagnosis not present

## 2024-03-06 DIAGNOSIS — Z0000 Encounter for general adult medical examination without abnormal findings: Secondary | ICD-10-CM | POA: Diagnosis not present

## 2024-03-06 DIAGNOSIS — E785 Hyperlipidemia, unspecified: Secondary | ICD-10-CM

## 2024-03-06 DIAGNOSIS — H40013 Open angle with borderline findings, low risk, bilateral: Secondary | ICD-10-CM | POA: Diagnosis not present

## 2024-03-06 LAB — COMPREHENSIVE METABOLIC PANEL
ALT: 36 U/L (ref 0–53)
AST: 30 U/L (ref 0–37)
Albumin: 4.7 g/dL (ref 3.5–5.2)
Alkaline Phosphatase: 79 U/L (ref 39–117)
BUN: 18 mg/dL (ref 6–23)
CO2: 26 meq/L (ref 19–32)
Calcium: 9.7 mg/dL (ref 8.4–10.5)
Chloride: 103 meq/L (ref 96–112)
Creatinine, Ser: 1.04 mg/dL (ref 0.40–1.50)
GFR: 67.13 mL/min (ref >60.00)
Glucose, Bld: 105 mg/dL — ABNORMAL HIGH (ref 70–99)
Potassium: 4.1 meq/L (ref 3.5–5.1)
Sodium: 138 meq/L (ref 135–145)
Total Bilirubin: 1.2 mg/dL (ref 0.2–1.2)
Total Protein: 7.8 g/dL (ref 6.0–8.3)

## 2024-03-06 LAB — CBC WITH DIFFERENTIAL/PLATELET
Basophils Absolute: 0.1 10*3/uL (ref 0.0–0.1)
Basophils Relative: 1 % (ref 0.0–3.0)
Eosinophils Absolute: 0.2 10*3/uL (ref 0.0–0.7)
Eosinophils Relative: 3.6 % (ref 0.0–5.0)
HCT: 46.7 % (ref 39.0–52.0)
Hemoglobin: 16.1 g/dL (ref 13.0–17.0)
Lymphocytes Relative: 32.3 % (ref 12.0–46.0)
Lymphs Abs: 1.6 10*3/uL (ref 0.7–4.0)
MCHC: 34.4 g/dL (ref 30.0–36.0)
MCV: 93.1 fl (ref 78.0–100.0)
Monocytes Absolute: 0.3 10*3/uL (ref 0.1–1.0)
Monocytes Relative: 6.9 % (ref 3.0–12.0)
Neutro Abs: 2.8 10*3/uL (ref 1.4–7.7)
Neutrophils Relative %: 56.2 % (ref 43.0–77.0)
Platelets: 178 10*3/uL (ref 150.0–400.0)
RBC: 5.02 Mil/uL (ref 4.22–5.81)
RDW: 13.1 % (ref 11.5–15.5)
WBC: 5.1 10*3/uL (ref 4.0–10.5)

## 2024-03-06 LAB — LIPID PANEL
Cholesterol: 160 mg/dL (ref 0–200)
HDL: 43 mg/dL (ref >39.00)
LDL Cholesterol: 90 mg/dL (ref 0–99)
NonHDL: 116.56
Total CHOL/HDL Ratio: 4
Triglycerides: 134 mg/dL (ref 0.0–149.0)
VLDL: 26.8 mg/dL (ref 0.0–40.0)

## 2024-03-06 MED ORDER — ATORVASTATIN CALCIUM 20 MG PO TABS
20.0000 mg | ORAL_TABLET | Freq: Every day | ORAL | 3 refills | Status: DC
  Filled 2024-12-08: qty 90, 90d supply, fill #0

## 2024-03-06 MED ORDER — LOSARTAN POTASSIUM 100 MG PO TABS
100.0000 mg | ORAL_TABLET | Freq: Every day | ORAL | 3 refills | Status: DC
  Filled 2024-12-03: qty 90, 90d supply, fill #0

## 2024-03-06 MED ORDER — AMLODIPINE BESYLATE 5 MG PO TABS
5.0000 mg | ORAL_TABLET | Freq: Every day | ORAL | 3 refills | Status: AC
  Filled 2024-12-16: qty 90, 90d supply, fill #0
  Filled ????-??-??: fill #1

## 2024-03-06 MED ORDER — PANTOPRAZOLE SODIUM 40 MG PO TBEC
40.0000 mg | DELAYED_RELEASE_TABLET | Freq: Every day | ORAL | 3 refills | Status: AC
  Filled 2024-11-26: qty 90, 90d supply, fill #0
  Filled ????-??-??: fill #1

## 2024-03-06 NOTE — Progress Notes (Signed)
 Established Patient Office Visit  Subjective   Patient ID: Hector Lewis, male    DOB: Dec 23, 1940  Age: 83 y.o. MRN: [id number]  Chief Complaint  Patient presents with   Annual Exam    HPI   Hector Lewis is here for annual physical exam.  Generally doing well.  He has hypertension which has been controlled with amlodipine and losartan.  He remains on atorvastatin for hyperlipidemia.  History of GERD controlled with Protonix.  He did receive RSV, flu, and COVID vaccines this fall at local pharmacy.  Still exercises regularly.  No longer plays pickle ball.  No recent falls.  No current depression issues.  He does continue to see dermatologist annually for skin cancer screening  Health maintenance reviewed:  Health Maintenance  Topic Date Due   COVID-19 Vaccine (10 - 2024-25 season) 03/21/2024 (Originally 02/20/2024)   Medicare Annual Wellness (AWV)  05/17/2024 (Originally 04/17/2024)   DTaP/Tdap/Td (5 - Td or Tdap) 01/18/2026   Colonoscopy  07/05/2028   INFLUENZA VACCINE  Completed   Zoster Vaccines- Shingrix  Completed   HPV VACCINES  Aged Out   Pneumonia Vaccine 17+ Years old  Discontinued   Hepatitis C Screening  Discontinued   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   Cataract 12/2021   Bilaterial cataract surgery   Complication of anesthesia    had urinary retention after surgery cause elevated heart rate and severe abd pain once catheterized symptoms resolved   GERD 12/29/2010   History of colon polyps    HYPERLIPIDEMIA 05/30/2009   Hyperthyroidism 1980's   resolved now, took medicine at the time   MEDIAL MENISCUS TEAR, RIGHT 10/17/2008   METATARSALGIA 01/27/2010   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   White coat syndrome with hypertension    no medications   Past Surgical History:   Procedure Laterality Date   APPENDECTOMY     done with colectomy   COLON SURGERY  2008   precancerous polyps   COLONOSCOPY  05/2018   EYE SURGERY     HAMMER TOE SURGERY  2012   HERNIA REPAIR  July 2019   Bilateral inguinal repair   INGUINAL HERNIA REPAIR N/A 06/22/2018   Procedure: LAPAROSCOPIC BILATERAL INGUINAL HERNIA REPAIR;  Surgeon: Karie Soda, MD;  Location: [facility];  Service: General;  Laterality: N/A;   INSERTION OF MESH N/A 06/22/2018   Procedure: INSERTION OF MESH;  Surgeon: Karie Soda, MD;  Location: [facility];  Service: General;  Laterality: N/A;   JOINT REPLACEMENT  2017   Right knee replacement   KNEE ARTHROSCOPY Right    PARTIAL COLECTOMY  2008   TONSILLECTOMY     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   Procedure: TOTAL KNEE ARTHROPLASTY;  Surgeon: Jodi Geralds, MD;  Location: [facility];  Service: Orthopedics;  Laterality: Right;    reports that he quit smoking about 55 years ago. His smoking use included cigarettes. He started smoking about 61 years ago. He has a 12 pack-year smoking history. He has never used smokeless tobacco. He reports current alcohol use. He reports that he does not use drugs. family history includes Cancer in his father; Hyperlipidemia in his mother. Allergies  Allergen Reactions   Codeine Sulfate Itching and Rash   Family history-mother lived  to be 104.  She had hyperlipidemia.  Father had colon cancer and prostate cancer.  He has a brother who is 31 who is alive and well.   Social history-married.  1 daughter and 1 granddaughter.  Quit smoking 1969.  Rare alcohol use.  He retired several years ago.  Review of Systems  Constitutional:  Negative for malaise/fatigue and weight loss.  Eyes:  Negative for blurred vision.  Respiratory:  Negative for shortness of breath.   Cardiovascular:  Negative for chest pain.  Gastrointestinal:  Negative for abdominal pain.  Neurological:  Negative for dizziness, weakness and headaches.       Objective:     BP 124/68 (BP Location: Left Arm, Cuff Size: Normal)   Pulse 81   Temp 98 F (36.7 C) (Oral)   Ht 5\' 10"  (1.778 m)   Wt 192 lb (87.1 kg)   SpO2 95%   BMI 27.55 kg/m  BP Readings from Last 3 Encounters:  03/06/24 124/68  03/08/23 134/76  03/01/23 126/72   Wt Readings from Last 3 Encounters:  03/06/24 192 lb (87.1 kg)  04/18/23 195 lb (88.5 kg)  03/08/23 195 lb (88.5 kg)      Physical Exam Vitals reviewed.  Constitutional:      Appearance: He is well-developed.  HENT:     Right Ear: External ear normal.     Left Ear: External ear normal.  Eyes:     Pupils: Pupils are equal, round, and reactive to light.  Neck:     Thyroid: No thyromegaly.  Cardiovascular:     Rate and Rhythm: Normal rate and regular rhythm.  Pulmonary:     Effort: Pulmonary effort is normal. No respiratory distress.     Breath sounds: Normal breath sounds. No wheezing or rales.  Abdominal:     Palpations: Abdomen is soft. There is no mass.     Tenderness: There is no abdominal tenderness.  Musculoskeletal:     Cervical back: Neck supple.     Right lower leg: No edema.     Left lower leg: No edema.  Neurological:     General: No focal deficit present.     Mental Status: He is alert and oriented to person, place, and time.      No results found for any visits on 03/06/24.    The ASCVD Risk score (Arnett DK, et al., 2019) failed to calculate for the following reasons:   The 2019 ASCVD risk score is only valid for ages 7 to 72    Assessment & Plan:   Problem List Items Addressed This Visit       Unprioritized   Hypertension   Relevant Medications   losartan (COZAAR) 100 MG tablet   atorvastatin (LIPITOR) 20 MG tablet   amLODipine (NORVASC) 5 MG tablet   Other Relevant Orders   Comprehensive metabolic panel   Hyperlipidemia   Relevant Medications   losartan (COZAAR) 100 MG tablet   atorvastatin (LIPITOR) 20 MG tablet   amLODipine (NORVASC) 5 MG tablet   Other  Relevant Orders   Comprehensive metabolic panel   Lipid panel   Other Visit Diagnoses       Physical exam    -  Primary   Relevant Orders   CBC with Differential/Platelet     83 year old male here for physical exam.  He has hypertension which is controlled.  Initial reading was up today but improved significantly after rest.  Refill all medications for 1 year.  His immunizations  are up-to-date.  He is very diligent with regard to regular exercise.  Obtain follow-up labs as above.  Continue annual flu vaccine.  No follow-ups on file.    Evelena Peat, MD

## 2024-03-07 DIAGNOSIS — E785 Hyperlipidemia, unspecified: Secondary | ICD-10-CM

## 2024-03-11 ENCOUNTER — Emergency Department (HOSPITAL_BASED_OUTPATIENT_CLINIC_OR_DEPARTMENT_OTHER): Admitting: Radiology

## 2024-03-11 ENCOUNTER — Emergency Department (HOSPITAL_BASED_OUTPATIENT_CLINIC_OR_DEPARTMENT_OTHER)

## 2024-03-11 ENCOUNTER — Other Ambulatory Visit: Payer: Self-pay

## 2024-03-11 ENCOUNTER — Encounter (HOSPITAL_BASED_OUTPATIENT_CLINIC_OR_DEPARTMENT_OTHER): Payer: Self-pay

## 2024-03-11 ENCOUNTER — Emergency Department (HOSPITAL_BASED_OUTPATIENT_CLINIC_OR_DEPARTMENT_OTHER)
Admission: EM | Admit: 2024-03-11 | Discharge: 2024-03-11 | Disposition: A | Discharge status: Home / Self Care | Attending: Emergency Medicine | Admitting: Emergency Medicine

## 2024-03-11 DIAGNOSIS — I10 Essential (primary) hypertension: Secondary | ICD-10-CM | POA: Diagnosis not present

## 2024-03-11 DIAGNOSIS — Z79899 Other long term (current) drug therapy: Secondary | ICD-10-CM | POA: Diagnosis not present

## 2024-03-11 DIAGNOSIS — W19XXXA Unspecified fall, initial encounter: Secondary | ICD-10-CM

## 2024-03-11 DIAGNOSIS — W08XXXA Fall from other furniture, initial encounter: Secondary | ICD-10-CM | POA: Diagnosis not present

## 2024-03-11 DIAGNOSIS — S0083XA Contusion of other part of head, initial encounter: Secondary | ICD-10-CM | POA: Insufficient documentation

## 2024-03-11 DIAGNOSIS — S199XXA Unspecified injury of neck, initial encounter: Secondary | ICD-10-CM | POA: Diagnosis not present

## 2024-03-11 DIAGNOSIS — S2232XA Fracture of one rib, left side, initial encounter for closed fracture: Secondary | ICD-10-CM | POA: Diagnosis not present

## 2024-03-11 DIAGNOSIS — S2242XA Multiple fractures of ribs, left side, initial encounter for closed fracture: Secondary | ICD-10-CM | POA: Insufficient documentation

## 2024-03-11 DIAGNOSIS — S0990XA Unspecified injury of head, initial encounter: Secondary | ICD-10-CM | POA: Diagnosis not present

## 2024-03-11 DIAGNOSIS — I6523 Occlusion and stenosis of bilateral carotid arteries: Secondary | ICD-10-CM | POA: Diagnosis not present

## 2024-03-11 DIAGNOSIS — R0789 Other chest pain: Secondary | ICD-10-CM | POA: Diagnosis present

## 2024-03-11 DIAGNOSIS — I6782 Cerebral ischemia: Secondary | ICD-10-CM | POA: Diagnosis not present

## 2024-03-11 LAB — CBC
HCT: 43.7 % (ref 39.0–52.0)
Hemoglobin: 15.2 g/dL (ref 13.0–17.0)
MCH: 31.5 pg (ref 26.0–34.0)
MCHC: 34.8 g/dL (ref 30.0–36.0)
MCV: 90.7 fL (ref 80.0–100.0)
Platelets: 177 10*3/uL (ref 150–400)
RBC: 4.82 MIL/uL (ref 4.22–5.81)
RDW: 12.4 % (ref 11.5–15.5)
WBC: 8.1 10*3/uL (ref 4.0–10.5)
nRBC: 0 % (ref 0.0–0.2)

## 2024-03-11 LAB — BASIC METABOLIC PANEL WITH GFR
Anion gap: 9 (ref 5–15)
BUN: 21 mg/dL (ref 8–23)
CO2: 24 mmol/L (ref 22–32)
Calcium: 9.3 mg/dL (ref 8.9–10.3)
Chloride: 105 mmol/L (ref 98–111)
Creatinine, Ser: 1.04 mg/dL (ref 0.61–1.24)
GFR, Estimated: >60 mL/min (ref >60)
Glucose, Bld: 89 mg/dL (ref 70–99)
Potassium: 3.9 mmol/L (ref 3.5–5.1)
Sodium: 138 mmol/L (ref 135–145)

## 2024-03-11 LAB — TROPONIN I (HIGH SENSITIVITY): Troponin I (High Sensitivity): 5 ng/L (ref <18)

## 2024-03-11 NOTE — ED Provider Notes (Signed)
 [place] EMERGENCY DEPARTMENT AT [facility] Provider Note   CSN: [id number] Arrival date & time: 03/11/24  1027     History  Chief Complaint  Patient presents with   Hector Lewis is a 83 y.o. male with medical history of hyperlipidemia, GERD, hypertension.  Patient presents to ED for evaluation of fall.  States that yesterday he was in his recliner sleeping.  States that he was lying flat, stood up very fast and became lightheaded all of a sudden.  States he fell onto his left side and struck his head as well as his left side of his chest wall.  He denies losing consciousness.  Denies preceding chest pain or shortness of breath.  States that he was on the ground for no more than 30 seconds and helped himself to his feet.  Comes in today complaining of pain to the left side of his chest wall.  He also has some ecchymosis and bruising to his left forehead.  He denies blood thinners.  He denies neck pain, nausea vomiting, abdominal pain.  Denies medications prior to arrival.   Fall Associated symptoms include headaches. Pertinent negatives include no shortness of breath.       Home Medications Prior to Admission medications   Medication Sig Start Date End Date Taking? Authorizing Provider  amLODipine (NORVASC) 5 MG tablet Take 1 tablet (5 mg total) by mouth daily. 03/06/24   Burchette, Elberta Fortis, MD  atorvastatin (LIPITOR) 20 MG tablet TAKE 1 TABLET(20 MG) BY MOUTH DAILY 03/06/24   Burchette, Elberta Fortis, MD  losartan (COZAAR) 100 MG tablet Take 1 tablet (100 mg total) by mouth daily. 03/06/24   Burchette, Elberta Fortis, MD  Multiple Vitamin (MULTIVITAMIN) capsule Take 1 capsule by mouth daily.    [provider]  Omega-3 Fatty Acids (FISH OIL) 1200 MG CAPS Take 1,200 mg by mouth 2 (two) times daily.    [provider]  pantoprazole (PROTONIX) 40 MG tablet Take 1 tablet (40 mg total) by mouth daily. 03/06/24   Burchette, Elberta Fortis, MD  Polyvinyl Alcohol-Povidone  (REFRESH OP) Place 1 drop into both eyes daily as needed (dry eyes).    [provider]  Probiotic Product (ULTRAFLORA IMMUNE HEALTH PO) Take 1 capsule by mouth daily.     [provider]      Allergies    Codeine sulfate    Review of Systems   Review of Systems  Respiratory:  Negative for shortness of breath.   Cardiovascular:        Chest wall pain  Musculoskeletal:  Negative for neck pain.  Neurological:  Positive for headaches.  All other systems reviewed and are negative.   Physical Exam Updated Vital Signs BP 137/71   Pulse (!) 58   Temp 98.1 F (36.7 C)   Resp 14   Ht 5\' 10"  (1.778 m)   Wt 87.1 kg   SpO2 96%   BMI 27.55 kg/m  Physical Exam Vitals and nursing note reviewed.  Constitutional:      General: He is not in acute distress.    Appearance: He is well-developed.  HENT:     Head: Normocephalic and atraumatic.  Eyes:     Conjunctiva/sclera: Conjunctivae normal.  Neck:     Comments: No cervical spinal tenderness Cardiovascular:     Rate and Rhythm: Normal rate and regular rhythm.     Heart sounds: No murmur heard. Pulmonary:     Effort: Pulmonary effort is  normal. No respiratory distress.     Breath sounds: Normal breath sounds.  Chest:       Comments: TTP of above circumscribed area.  No overlying skin change, no bruising, ecchymosis or erythema.  No crepitus. Abdominal:     Palpations: Abdomen is soft.     Tenderness: There is no abdominal tenderness.  Musculoskeletal:        General: No swelling.     Cervical back: Neck supple.  Skin:    General: Skin is warm and dry.     Capillary Refill: Capillary refill takes less than 2 seconds.  Neurological:     General: No focal deficit present.     Mental Status: He is alert.     GCS: GCS eye subscore is 4. GCS verbal subscore is 5. GCS motor subscore is 6.     Cranial Nerves: Cranial nerves 2-12 are intact. No cranial nerve deficit.     Sensory: Sensation is intact. No sensory  deficit.     Motor: Motor function is intact.     Comments: CN III through XII intact.  Intact finger-nose and heel-to-shin.  No pronator drift, no slurred speech.  Equal grip strength upper extremities bilaterally.  Equal strength bilateral lower extremities.  Psychiatric:        Mood and Affect: Mood normal.     ED Results / Procedures / Treatments   Labs (all labs ordered are listed, but only abnormal results are displayed) Labs Reviewed  CBC  BASIC METABOLIC PANEL WITH GFR  TROPONIN I (HIGH SENSITIVITY)    EKG None  Radiology CT Cervical Spine Wo Contrast Result Date: 03/11/2024 CLINICAL DATA:  Head trauma, moderate-severe; Neck trauma (Age >= 65y). Fall yesterday, hitting the left side of the head. EXAM: CT HEAD WITHOUT CONTRAST CT CERVICAL SPINE WITHOUT CONTRAST TECHNIQUE: Multidetector CT imaging of the head and cervical spine was performed following the standard protocol without intravenous contrast. Multiplanar CT image reconstructions of the cervical spine were also generated. RADIATION DOSE REDUCTION: This exam was performed according to the departmental dose-optimization program which includes automated exposure control, adjustment of the mA and/or kV according to patient size and/or use of iterative reconstruction technique. COMPARISON:  None Available. FINDINGS: CT HEAD FINDINGS Brain: There is no evidence of an acute infarct, intracranial hemorrhage, mass, midline shift, or extra-axial fluid collection. Mild cerebral atrophy is within normal limits for age. The ventricles are normal in size. Cerebral white matter hypodensities are nonspecific but compatible with mild chronic small vessel ischemic disease. Vascular: No hyperdense vessel or unexpected calcification. Skull: No acute fracture or suspicious lesion. Sinuses/Orbits: Mucous retention cysts in the maxillary sinuses. Clear mastoid air cells. Bilateral cataract extraction. Other: None. CT CERVICAL SPINE FINDINGS Alignment:  Focally increased lordosis in the lower cervical spine. Trace retrolisthesis of C4 on C5. Trace anterolisthesis of C7 on T1 and T1 on T2. Skull base and vertebrae: No acute fracture or suspicious osseous lesion. Soft tissues and spinal canal: No prevertebral fluid or swelling. No visible canal hematoma. Disc levels: Facet and partial interbody ankylosis at C2-3. Moderate disc degeneration from C3-4 through C6-7. Advanced multilevel facet arthrosis. Suspected mild spinal stenosis at C4-5 and C5-6. Moderate to severe multilevel neural foraminal stenosis. Upper chest: Clear lung apices. Other: Mild atherosclerotic calcification at the carotid bifurcations. IMPRESSION: 1. No evidence of acute intracranial abnormality or cervical spine fracture. 2. Mild chronic small vessel ischemic disease. 3. 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 Electronically Signed   By: Freida Busman  Mosetta Putt M.D.   On: 03/11/2024 12:22   CT Head Wo Contrast Result Date: 03/11/2024 CLINICAL DATA:  Head trauma, moderate-severe; Neck trauma (Age >= 65y). Fall yesterday, hitting the left side of the head. EXAM: CT HEAD WITHOUT CONTRAST CT CERVICAL SPINE WITHOUT CONTRAST TECHNIQUE: Multidetector CT imaging of the head and cervical spine was performed following the standard protocol without intravenous contrast. Multiplanar CT image reconstructions of the cervical spine were also generated. RADIATION DOSE REDUCTION: This exam was performed according to the departmental dose-optimization program which includes automated exposure control, adjustment of the mA and/or kV according to patient size and/or use of iterative reconstruction technique. COMPARISON:  None Available. FINDINGS: CT HEAD FINDINGS Brain: There is no evidence of an acute infarct, intracranial hemorrhage, mass, midline shift, or extra-axial fluid collection. Mild cerebral atrophy is within normal limits for age. The ventricles are normal in size. Cerebral white matter  hypodensities are nonspecific but compatible with mild chronic small vessel ischemic disease. Vascular: No hyperdense vessel or unexpected calcification. Skull: No acute fracture or suspicious lesion. Sinuses/Orbits: Mucous retention cysts in the maxillary sinuses. Clear mastoid air cells. Bilateral cataract extraction. Other: None. CT CERVICAL SPINE FINDINGS Alignment: Focally increased lordosis in the lower cervical spine. Trace retrolisthesis of C4 on C5. Trace anterolisthesis of C7 on T1 and T1 on T2. Skull base and vertebrae: No acute fracture or suspicious osseous lesion. Soft tissues and spinal canal: No prevertebral fluid or swelling. No visible canal hematoma. Disc levels: Facet and partial interbody ankylosis at C2-3. Moderate disc degeneration from C3-4 through C6-7. Advanced multilevel facet arthrosis. Suspected mild spinal stenosis at C4-5 and C5-6. Moderate to severe multilevel neural foraminal stenosis. Upper chest: Clear lung apices. Other: Mild atherosclerotic calcification at the carotid bifurcations. IMPRESSION: 1. No evidence of acute intracranial abnormality or cervical spine fracture. 2. Mild chronic small vessel ischemic disease. 3. 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 Electronically Signed   By: Sebastian Ache M.D.   On: 03/11/2024 12:22   DG Ribs Unilateral W/Chest Left Result Date: 03/11/2024 CLINICAL DATA:  Status post fall.  Left chest pain. EXAM: LEFT RIBS AND CHEST - 3+ VIEW COMPARISON:  Chest radiograph 02/06/2016 FINDINGS: Heart size and mediastinal contours appear normal. No pleural fluid, interstitial edema or airspace consolidation. There is an acute fracture involving the anterolateral aspect of the left seventh rib. There is also possibly an adjacent left anterolateral eighth rib fracture. IMPRESSION: 1. Acute fracture involving the anterolateral aspect of the left seventh rib. 2. Possible adjacent left anterolateral eighth rib fracture. Electronically  Signed   By: Signa Kell M.D.   On: 03/11/2024 12:14    Procedures Procedures   Medications Ordered in ED Medications - No data to display  ED Course/ Medical Decision Making/ A&P  Medical Decision Making Amount and/or Complexity of Data Reviewed Labs: ordered. Radiology: ordered.   83 year old male presents for evaluation.  Please see HPI for further details.  On examination patient is afebrile and nontachycardic.  Lung sounds are clear bilaterally, he is not hypoxic.  Abdomen soft and compressible.  Neurological examinations at baseline without focal neurodeficits.  No cervical spinal tenderness.  Patient does have tenderness to palpation of left-sided chest wall without overlying skin change, bruising, ecchymosis.  No crepitus.  Will collect labs to include CBC, BMP, one-time troponin to rule out ACS.  Will also collect EKG.  Will image patient CT head, CT cervical spine as well as plain film imaging of left chest wall.  CBC without  leukocytosis or anemia.  Metabolic panel without electrolyte derangement.  Troponin 5, EKG nonischemic.  Suspect patient syncope secondary to orthostasis.  Imaging of cervical spine, CT head unremarkable without acute process.  X-ray imaging of left-sided chest wall shows acute fracture involving the anterior lateral aspect of the left seventh rib, also possible adjacent left anterior lateral eighth rib fracture.  Patient curb 65 score 2.  Will have patient follow-up closely with his PCP.  Have provided patient with incentive spirometer.  Have counseled him on splinting at home as well as taking deep breaths and explained importance of this.  He had all of his questions answered his satisfaction.  He is stable to discharge home.   Final Clinical Impression(s) / ED Diagnoses Final diagnoses:  Fall, initial encounter  Closed fracture of multiple ribs of left side, initial encounter    Rx / DC Orders ED Discharge Orders     None          Al Decant, PA-C 03/11/24 1346    Tegeler, Canary Brim, MD 03/11/24 [phone]

## 2024-03-11 NOTE — Discharge Instructions (Addendum)
 It was a pleasure taking part in your care.  As discussed, you do have rib fractures on the left side your seventh and eighth ribs.  Please utilize incentive spirometer, utilize this 10 times per hour.  Please follow-up closely with your PCP Dr. Caryl Never.  Return to the ED with any new symptom such as fevers, shortness of breath.  Please utilize splinting techniques as discussed.  Take ibuprofen or Tylenol at home for pain.

## 2024-03-11 NOTE — ED Triage Notes (Addendum)
 Patient arrives POV with complaints arrives with complaints of having a fall yesterday. Patient states that he legs became weak and he became dizzy when he stood up from a chair, and fell down. Patient did hit his head and he is now having increased pain on side left side.

## 2024-03-13 ENCOUNTER — Ambulatory Visit: Admitting: Family Medicine

## 2024-03-13 ENCOUNTER — Encounter: Payer: Self-pay | Admitting: Family Medicine

## 2024-03-13 VITALS — BP 132/78 | HR 60 | Temp 96.0°F | Ht 70.0 in | Wt 193.2 lb

## 2024-03-13 DIAGNOSIS — S2242XD Multiple fractures of ribs, left side, subsequent encounter for fracture with routine healing: Secondary | ICD-10-CM | POA: Diagnosis not present

## 2024-03-13 DIAGNOSIS — I10 Essential (primary) hypertension: Secondary | ICD-10-CM

## 2024-03-13 MED ORDER — TRAMADOL HCL 50 MG PO TABS: 50.0000 mg | ORAL_TABLET | Freq: Four times a day (QID) | ORAL | 0 refills | Status: DC | PRN

## 2024-03-13 NOTE — Progress Notes (Signed)
 Established Patient Office Visit  Subjective   Patient ID: Hector Lewis, male    DOB: June 11, 1941  Age: 83 y.o. MRN: [id number]  Chief Complaint  Patient presents with   Follow-up    Ed follow-up from a fall on 3/29, patient was seen 3/30, 2 fractured ribs on the left side, Rate of pain 9 out of 10 when having sharp pain     HPI   Hector Lewis is seen for ER follow-up regarding recent fall with left seventh and eighth rib fractures.  He has history of hyperlipidemia, GERD, hypertension.  Stays very active without history of regular falls.  He had been in his recliner the day just prior to the evaluation and had been sleeping taking afternoon nap.  He got up fairly rapidly and felt lightheaded and apparently fell down but no reported loss of consciousness.  No dyspnea or chest pain.  Got up fairly quickly from the ground and was able to ambulate.  He was aware of some left sided chest wall pain and also had struck left side of his face.  He does have hypertension which is treated with amlodipine and losartan but does not have any consistent or regular orthostatic type symptoms.  EKG showed normal sinus rhythm.  CBC and basic metabolic panel were normal.  CT head and cervical spine revealed no acute fractures or other acute abnormalities.  X-rays did reveal left seventh rib fracture and possible eighth rib fracture.  This does correlate with his area of pain.  He was given incentive spirometer.  He has been taking Advil and Tylenol but having very poor pain control.  Extreme pain with getting in and out of bed and changing positions.  Has been using his incentive spirometer regularly.  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   Cataract 12/2021   Bilaterial cataract surgery   Complication of anesthesia    had urinary retention after surgery cause  elevated heart rate and severe abd pain once catheterized symptoms resolved   GERD 12/29/2010   History of colon polyps    HYPERLIPIDEMIA 05/30/2009   Hyperthyroidism 1980's   resolved now, took medicine at the time   MEDIAL MENISCUS TEAR, RIGHT 10/17/2008   METATARSALGIA 01/27/2010   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   White coat syndrome with hypertension    no medications   Past Surgical History:  Procedure Laterality Date   APPENDECTOMY     done with colectomy   COLON SURGERY  2008   precancerous polyps   COLONOSCOPY  05/2018   EYE SURGERY     HAMMER TOE SURGERY  2012   HERNIA REPAIR  July 2019   Bilateral inguinal repair   INGUINAL HERNIA REPAIR N/A 06/22/2018   Procedure: LAPAROSCOPIC BILATERAL INGUINAL HERNIA REPAIR;  Surgeon: Karie Soda, MD;  Location: [facility];  Service: General;  Laterality: N/A;   INSERTION OF MESH N/A 06/22/2018   Procedure: INSERTION OF MESH;  Surgeon: Karie Soda, MD;  Location: [facility];  Service: General;  Laterality: N/A;   JOINT REPLACEMENT  2017   Right knee replacement   KNEE ARTHROSCOPY Right    PARTIAL COLECTOMY  2008   TONSILLECTOMY     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   Procedure: TOTAL KNEE ARTHROPLASTY;  Surgeon: Jodi Geralds, MD;  [facility];  Service: Orthopedics;  Laterality: Right;    reports that he quit smoking about 55 years ago. His smoking use included cigarettes. He started smoking about 61 years ago. He has a 12 pack-year smoking history. He has never used smokeless tobacco. He reports current alcohol use. He reports that he does not use drugs. family history includes Cancer in his father; Hyperlipidemia in his mother. Allergies  Allergen Reactions   Codeine Sulfate Itching and Rash    Review of Systems  Constitutional:  Negative for chills and fever.  Respiratory:  Negative for hemoptysis and shortness of breath.   Cardiovascular:  Negative for chest pain.  Neurological:   Negative for dizziness, focal weakness and loss of consciousness.      Objective:     BP 132/78 (BP Location: Right Arm, Patient Position: Sitting, Cuff Size: Normal)   Pulse 60   Temp (!) 96 F (35.6 C) (Oral)   Ht 5\' 10"  (1.778 m)   Wt 193 lb 3.2 oz (87.6 kg)   SpO2 96%   BMI 27.72 kg/m  BP Readings from Last 3 Encounters:  03/13/24 132/78  03/11/24 137/71  03/06/24 124/68   Wt Readings from Last 3 Encounters:  03/13/24 193 lb 3.2 oz (87.6 kg)  03/11/24 192 lb (87.1 kg)  03/06/24 192 lb (87.1 kg)      Physical Exam Vitals reviewed.  Constitutional:      General: He is not in acute distress.    Appearance: He is not ill-appearing.  HENT:     Head: Normocephalic and atraumatic.  Cardiovascular:     Rate and Rhythm: Normal rate and regular rhythm.  Pulmonary:     Effort: Pulmonary effort is normal.     Breath sounds: Normal breath sounds. No wheezing or rales.  Neurological:     General: No focal deficit present.     Mental Status: He is alert.     Cranial Nerves: No cranial nerve deficit.      No results found for any visits on 03/13/24.  Last CBC Lab Results  Component Value Date   WBC 8.1 03/11/2024   HGB 15.2 03/11/2024   HCT 43.7 03/11/2024   MCV 90.7 03/11/2024   MCH 31.5 03/11/2024   RDW 12.4 03/11/2024   PLT 177 03/11/2024   Last metabolic panel Lab Results  Component Value Date   GLUCOSE 89 03/11/2024   NA 138 03/11/2024   K 3.9 03/11/2024   CL 105 03/11/2024   CO2 24 03/11/2024   BUN 21 03/11/2024   CREATININE 1.04 03/11/2024   GFRNONAA >60 03/11/2024   CALCIUM 9.3 03/11/2024   PROT 7.8 03/06/2024   ALBUMIN 4.7 03/06/2024   BILITOT 1.2 03/06/2024   ALKPHOS 79 03/06/2024   AST 30 03/06/2024   ALT 36 03/06/2024   ANIONGAP 9 03/11/2024      The ASCVD Risk score (Arnett DK, et al., 2019) failed to calculate for the following reasons:   The 2019 ASCVD risk score is only valid for ages 6 to 49    Assessment & Plan:   #1 left  seventh and possible eighth rib fracture following fall.  No displacement.  Patient using incentive spirometer regularly.  He has been using Advil and Tylenol but still having fairly severe pain intermittently.  Requesting additional pain medication.  We discussed potential risk including constipation and dizziness with pain medication.  We agreed to limited tramadol 50 mg 1 every 6 hours  as needed for severe pain #20.  Supplement with Tylenol.  Continue frequent daily use of incentive spirometer  #2 hypertension stable and well-controlled.  No consistent pattern of orthostasis.  Did not have orthostatic changes in the ER.  Continue current medications and stay well-hydrated.  Change positions slowly  30 minutes were spent in combined face-to-face and non-face-to-face including time reviewing recent events, ER notes, imaging, and labs  Evelena Peat, MD

## 2024-03-15 ENCOUNTER — Other Ambulatory Visit: Payer: Self-pay | Admitting: Family Medicine

## 2024-03-16 MED ORDER — TRAMADOL HCL 50 MG PO TABS: 50.0000 mg | ORAL_TABLET | Freq: Four times a day (QID) | ORAL | 0 refills | Status: AC | PRN

## 2024-04-19 ENCOUNTER — Ambulatory Visit: Payer: Self-pay

## 2024-04-19 NOTE — Telephone Encounter (Signed)
   Chief Complaint: Hector Lewis in March and had broken ribs. Since then has had SOB with exertion, "which is unusual for me." Warm transfer to Norma in the practice for appointment. Symptoms: Above Frequency: March Pertinent Negatives: Patient denies chest pain Disposition: [] ED /[] Urgent Care (no appt availability in office) / [] Appointment(In office/virtual)/ []  [facility]/ [] Home Care/ [] Refused Recommended Disposition /[] [facility]/ [x]  Follow-up with PCP Additional Notes:   Reason for Disposition  [1] MILD difficulty breathing (e.g., minimal/no SOB at rest, SOB with walking, pulse <100) AND [2] NEW-onset or WORSE than normal  Answer Assessment - Initial Assessment Questions 1. RESPIRATORY STATUS: "Describe your breathing?" (e.g., wheezing, shortness of breath, unable to speak, severe coughing)      SOB in March and broke 2 ribs 2. ONSET: "When did this breathing problem begin?"      March 3. PATTERN "Does the difficult breathing come and go, or has it been constant since it started?"      With exertion 4. SEVERITY: "How bad is your breathing?" (e.g., mild, moderate, severe)    - MILD: No SOB at rest, mild SOB with walking, speaks normally in sentences, can lie down, no retractions, pulse < 100.    - MODERATE: SOB at rest, SOB with minimal exertion and prefers to sit, cannot lie down flat, speaks in phrases, mild retractions, audible wheezing, pulse 100-120.    - SEVERE: Very SOB at rest, speaks in single words, struggling to breathe, sitting hunched forward, retractions, pulse > 120      Mild 5. RECURRENT SYMPTOM: "Have you had difficulty breathing before?" If Yes, ask: "When was the last time?" and "What happened that time?"      no 6. CARDIAC HISTORY: "Do you have any history of heart disease?" (e.g., heart attack, angina, bypass surgery, angioplasty)      no 7. LUNG HISTORY: "Do you have any history of lung disease?"  (e.g., pulmonary embolus, asthma,  emphysema)     no 8. CAUSE: "What do you think is causing the breathing problem?"      Unsure 9. OTHER SYMPTOMS: "Do you have any other symptoms? (e.g., dizziness, runny nose, cough, chest pain, fever)     Dizziness 10. O2 SATURATION MONITOR:  "Do you use an oxygen saturation monitor (pulse oximeter) at home?" If Yes, ask: "What is your reading (oxygen level) today?" "What is your usual oxygen saturation reading?" (e.g., 95%)       no 11. PREGNANCY: "Is there any chance you are pregnant?" "When was your last menstrual period?"       N/a 12. TRAVEL: "Have you traveled out of the country in the last month?" (e.g., travel history, exposures)       no  Protocols used: Breathing Difficulty-A-AH

## 2024-04-20 ENCOUNTER — Ambulatory Visit (INDEPENDENT_AMBULATORY_CARE_PROVIDER_SITE_OTHER)

## 2024-04-20 ENCOUNTER — Ambulatory Visit: Admitting: Family Medicine

## 2024-04-20 ENCOUNTER — Encounter: Payer: Self-pay | Admitting: Family Medicine

## 2024-04-20 VITALS — BP 130/62 | HR 84 | Temp 98.3°F | Wt 188.7 lb

## 2024-04-20 DIAGNOSIS — R0600 Dyspnea, unspecified: Secondary | ICD-10-CM | POA: Diagnosis not present

## 2024-04-20 NOTE — Progress Notes (Signed)
 Established Patient Office Visit  Subjective   Patient ID: Hector Lewis, male    DOB: 1941/03/04  Age: 83 y.o. MRN: [id number]  Chief Complaint  Patient presents with   Fall   Dizziness   Shortness of Breath    HPI   Hector Lewis is seen today to discuss some recent shortness of breath.  He had left-sided rib fractures recently.  Had taken a nap in his recliner and got up fairly rapidly and felt lightheaded and fell down striking the left chest wall region.  Went to the ER the next day in CT head and cervical spine no acute findings.  Did have left seventh rib fracture and possible eighth rib fracture.  His pain is gradually improving but he did send in a message stating that he was having some dyspnea with things like stair climbing and quick exertion type activities.  He feels like he has noticed some shortness of breath ever since the fall.  Denies any pleuritic pain.  No chest pain.  O2 sats have been normal and stable.  No recent fever.  No cough.  No peripheral edema.  No orthopnea.  No family history of premature CAD.  He is able to tolerate his group exercise class that he does but having difficulties with more extreme exertion such as climbing stairs.  Has not had any recurrent syncopal or presyncopal type episodes.  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   Cataract 12/2021   Bilaterial cataract surgery   Complication of anesthesia    had urinary retention after surgery cause elevated heart rate and severe abd pain once catheterized symptoms resolved   GERD 12/29/2010   History of colon polyps    HYPERLIPIDEMIA 05/30/2009   Hyperthyroidism 1980's   resolved  now, took medicine at the time   MEDIAL MENISCUS TEAR, RIGHT 10/17/2008   METATARSALGIA 01/27/2010   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   White coat syndrome with hypertension    no medications   Past Surgical History:  Procedure Laterality Date   APPENDECTOMY     done with colectomy   COLON SURGERY  2008   precancerous polyps   COLONOSCOPY  05/2018   EYE SURGERY     HAMMER TOE SURGERY  2012   HERNIA REPAIR  July 2019   Bilateral inguinal repair   INGUINAL HERNIA REPAIR N/A 06/22/2018   Procedure: LAPAROSCOPIC BILATERAL INGUINAL HERNIA REPAIR;  Surgeon: Candyce Champagne, MD;  Location: [facility];  Service: General;  Laterality: N/A;   INSERTION OF MESH N/A 06/22/2018   Procedure: INSERTION OF MESH;  Surgeon: Candyce Champagne, MD;  Location: [facility];  Service: General;  Laterality: N/A;   JOINT REPLACEMENT  2017   Right knee replacement   KNEE ARTHROSCOPY Right    PARTIAL COLECTOMY  2008   TONSILLECTOMY     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   Procedure: TOTAL KNEE ARTHROPLASTY;  Surgeon: Neil Balls, MD;  Location: [facility] OR;  Service: Orthopedics;  Laterality: Right;    reports that he quit smoking about 55 years ago. His smoking use  included cigarettes. He started smoking about 61 years ago. He has a 12 pack-year smoking history. He has never used smokeless tobacco. He reports current alcohol use. He reports that he does not use drugs. family history includes Cancer in his father; Hyperlipidemia in his mother. Allergies  Allergen Reactions   Codeine Sulfate Itching and Rash    Review of Systems  Constitutional:  Negative for chills and fever.  Respiratory:  Positive for shortness of breath. Negative for cough, hemoptysis and wheezing.   Cardiovascular:  Negative for chest pain, palpitations, orthopnea, leg swelling and PND.      Objective:     BP 130/62 (BP Location: Left Arm, Patient Position: Sitting, Cuff Size: Normal)   Pulse 84   Temp  98.3 F (36.8 C) (Oral)   Wt 188 lb 11.2 oz (85.6 kg)   SpO2 95%   BMI 27.08 kg/m  BP Readings from Last 3 Encounters:  04/20/24 130/62  03/13/24 132/78  03/11/24 137/71   Wt Readings from Last 3 Encounters:  04/20/24 188 lb 11.2 oz (85.6 kg)  03/13/24 193 lb 3.2 oz (87.6 kg)  03/11/24 192 lb (87.1 kg)      Physical Exam Vitals reviewed.  Constitutional:      General: He is not in acute distress.    Appearance: He is not ill-appearing.  Cardiovascular:     Rate and Rhythm: Normal rate and regular rhythm.     Heart sounds:     No gallop.     Comments: May have very faint systolic murmur over aortic valve Pulmonary:     Effort: Pulmonary effort is normal.     Breath sounds: Normal breath sounds. No decreased breath sounds, wheezing or rales.  Musculoskeletal:     Right lower leg: No tenderness.     Left lower leg: No tenderness.  Neurological:     Mental Status: He is alert.      No results found for any visits on 04/20/24.  Last CBC Lab Results  Component Value Date   WBC 8.1 03/11/2024   HGB 15.2 03/11/2024   HCT 43.7 03/11/2024   MCV 90.7 03/11/2024   MCH 31.5 03/11/2024   RDW 12.4 03/11/2024   PLT 177 03/11/2024   Last metabolic panel Lab Results  Component Value Date   GLUCOSE 89 03/11/2024   NA 138 03/11/2024   K 3.9 03/11/2024   CL 105 03/11/2024   CO2 24 03/11/2024   BUN 21 03/11/2024   CREATININE 1.04 03/11/2024   GFRNONAA >60 03/11/2024   CALCIUM  9.3 03/11/2024   PROT 7.8 03/06/2024   ALBUMIN 4.7 03/06/2024   BILITOT 1.2 03/06/2024   ALKPHOS 79 03/06/2024   AST 30 03/06/2024   ALT 36 03/06/2024   ANIONGAP 9 03/11/2024   Last lipids Lab Results  Component Value Date   CHOL 160 03/06/2024   HDL 43.00 03/06/2024   LDLCALC 90 03/06/2024   LDLDIRECT 131.2 01/04/2014   TRIG 134.0 03/06/2024   CHOLHDL 4 03/06/2024   Last hemoglobin A1c No results found for: "HGBA1C" Last thyroid  functions Lab Results  Component Value Date   TSH  3.54 02/22/2022      The ASCVD Risk score (Arnett DK, et al., 2019) failed to calculate for the following reasons:   The 2019 ASCVD risk score is only valid for ages 23 to 17    Assessment & Plan:   Problem List Items Addressed This Visit   None Visit Diagnoses  Dyspnea, unspecified type    -  Primary   Relevant Orders   DG Chest 2 View   ECHOCARDIOGRAM COMPLETE     Patient presents with several week history of some dyspnea with exertion.  No chest pain.  Symptoms correlate with recent fall where he struck his left chest wall with left seventh and possibly left eighth rib fracture.  No pneumothorax from x-rays that were done in ER and no displaced rib fracture.  He is not describing symptoms to suggest PE.  No history of heart failure.  No peripheral edema.  No orthopnea.  Lung exam unremarkable.  No recent cough.  O2 sats been stable.  Etiology unclear.  -Start with PA and lateral chest x-ray - Set up echocardiogram - If above unrevealing and symptoms persist consider pulmonary referral for possible PFTs  No follow-ups on file.    Glean Lamy, MD

## 2024-04-20 NOTE — Telephone Encounter (Signed)
 Patient has appt today.

## 2024-04-24 ENCOUNTER — Ambulatory Visit: Admitting: Family Medicine

## 2024-04-25 ENCOUNTER — Ambulatory Visit (INDEPENDENT_AMBULATORY_CARE_PROVIDER_SITE_OTHER): Payer: Medicare PPO

## 2024-04-25 VITALS — Ht 70.0 in | Wt 188.0 lb

## 2024-04-25 DIAGNOSIS — Z0000 Encounter for general adult medical examination without abnormal findings: Secondary | ICD-10-CM

## 2024-04-25 NOTE — Patient Instructions (Signed)
 [name] , Thank you for taking time out of your busy schedule to complete your Annual Wellness Visit with me. I enjoyed our conversation and look forward to speaking with you again next year. I, as well as your care team,  appreciate your ongoing commitment to your health goals. Please review the following plan we discussed and let me know if I can assist you in the future. Your Game plan/ To Do List    Referrals: If you haven't heard from the office you've been referred to, please reach out to them at the phone provided.   Follow up Visits: Next Medicare AWV with our clinical staff: 05/01/25 @ 11:20a   Have you seen your provider in the last 6 months (3 months if uncontrolled diabetes)? Yes Next Office Visit with your provider: 05/16/24 @ 8a  Clinician Recommendations:  Aim for 30 minutes of exercise or brisk walking, 6-8 glasses of water , and 5 servings of fruits and vegetables each day.       This is a list of the screening recommended for you and due dates:  Health Maintenance  Topic Date Due   COVID-19 Vaccine (10 - 2024-25 season) 02/20/2024   Flu Shot  07/13/2024   Medicare Annual Wellness Visit  04/25/2025   DTaP/Tdap/Td vaccine (5 - Td or Tdap) 01/18/2026   Colon Cancer Screening  07/05/2028   Zoster (Shingles) Vaccine  Completed   HPV Vaccine  Aged Out   Meningitis B Vaccine  Aged Out   Pneumonia Vaccine  Discontinued   Hepatitis C Screening  Discontinued    Advanced directives: (In Chart) A copy of your advanced directives are scanned into your chart should your provider ever need it. Advance Care Planning is important because it:  [x]  Makes sure you receive the medical care that is consistent with your values, goals, and preferences  [x]  It provides guidance to your family and loved ones and reduces their decisional burden about whether or not they are making the right decisions based on your wishes.  Follow the link provided in your after visit summary or read over  the paperwork we have mailed to you to help you started getting your Advance Directives in place. If you need assistance in completing these, please reach out to us  so that we can help you!  See attachments for Preventive Care and Fall Prevention Tips.

## 2024-04-25 NOTE — Progress Notes (Signed)
 Subjective:   Hector Lewis is a 83 y.o. who presents for a Medicare Wellness preventive visit.  As a reminder, Annual Wellness Visits don't include a physical exam, and some assessments may be limited, especially if this visit is performed virtually. We may recommend an in-person visit if needed.  Visit Complete: Virtual I connected with  Baris Pajak Germano on 04/25/24 by a audio enabled telemedicine application and verified that I am speaking with the correct person using two identifiers.  Patient Location: Home  Provider Location: Home Office  I discussed the limitations of evaluation and management by telemedicine. The patient expressed understanding and agreed to proceed.  Vital Signs: Because this visit was a virtual/telehealth visit, some criteria may be missing or patient reported. Any vitals not documented were not able to be obtained and vitals that have been documented are patient reported.    Persons Participating in Visit: Patient.  AWV Questionnaire: Yes: Patient Medicare AWV questionnaire was completed by the patient on 04/18/24; I have confirmed that all information answered by patient is correct and no changes since this date.  Cardiac Risk Factors include: advanced age (>58men, >40 women);male gender;hypertension     Objective:     Today's Vitals   04/25/24 1123  Weight: 188 lb (85.3 kg)  Height: 5\' 10"  (1.778 m)   Body mass index is 26.98 kg/m.     04/25/2024   11:31 AM 04/18/2023   12:47 PM 04/15/2022   10:41 AM 04/14/2021   11:12 AM 02/06/2020    1:21 PM 06/12/2018    1:26 PM 11/09/2017    9:35 AM  Advanced Directives  Does Patient Have a Medical Advance Directive? Yes Yes Yes No Yes Yes Yes  Type of Estate agent of [place];Living will Healthcare Power of [place];Living will Healthcare Power of [place];Living will  Living will;Healthcare Power of [employer] Power of [place];Living will Healthcare Power of [place];Living  will  Does patient want to make changes to medical advance directive? No - Patient declined No - Patient declined No - Patient declined  No - Patient declined No - Patient declined No - Patient declined  Copy of Healthcare Power of Attorney in Chart? Yes - validated most recent copy scanned in chart (See row information) Yes - validated most recent copy scanned in chart (See row information) No - copy requested  No - copy requested No - copy requested   Would patient like information on creating a medical advance directive?    No - Patient declined       Current Medications (verified) Outpatient Encounter Medications as of 04/25/2024  Medication Sig   amLODipine  (NORVASC ) 5 MG tablet Take 1 tablet (5 mg total) by mouth daily.   atorvastatin  (LIPITOR) 20 MG tablet TAKE 1 TABLET(20 MG) BY MOUTH DAILY   losartan  (COZAAR ) 100 MG tablet Take 1 tablet (100 mg total) by mouth daily.   Multiple Vitamin (MULTIVITAMIN) capsule Take 1 capsule by mouth daily.   Omega-3 Fatty Acids (FISH OIL) 1200 MG CAPS Take 1,200 mg by mouth 2 (two) times daily.   pantoprazole  (PROTONIX ) 40 MG tablet Take 1 tablet (40 mg total) by mouth daily.   Polyvinyl Alcohol-Povidone (REFRESH OP) Place 1 drop into both eyes daily as needed (dry eyes).   Probiotic Product (ULTRAFLORA IMMUNE HEALTH PO) Take 1 capsule by mouth daily.    No facility-administered encounter medications on file as of 04/25/2024.    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   Cataract 12/2021   Bilaterial cataract surgery   Complication of anesthesia    had urinary retention after surgery cause elevated heart rate and severe abd pain once catheterized symptoms resolved   GERD 12/29/2010   History of colon polyps    HYPERLIPIDEMIA 05/30/2009    Hyperthyroidism 1980's   resolved now, took medicine at the time   MEDIAL MENISCUS TEAR, RIGHT 10/17/2008   METATARSALGIA 01/27/2010   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   White coat syndrome with hypertension    no medications   Past Surgical History:  Procedure Laterality Date   APPENDECTOMY     done with colectomy   COLON SURGERY  2008   precancerous polyps   COLONOSCOPY  05/2018   EYE SURGERY     HAMMER TOE SURGERY  2012   HERNIA REPAIR  July 2019   Bilateral inguinal repair   INGUINAL HERNIA REPAIR N/A 06/22/2018   Procedure: LAPAROSCOPIC BILATERAL INGUINAL HERNIA REPAIR;  Surgeon: Candyce Champagne, MD;  Location: [facility];  Service: General;  Laterality: N/A;   INSERTION OF MESH N/A 06/22/2018   Procedure: INSERTION OF MESH;  Surgeon: Candyce Champagne, MD;  Location: [facility];  Service: General;  Laterality: N/A;   JOINT REPLACEMENT  2017   Right knee replacement   KNEE ARTHROSCOPY Right    PARTIAL COLECTOMY  2008   TONSILLECTOMY     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   Procedure: TOTAL KNEE ARTHROPLASTY;  Surgeon: Neil Balls, MD;  Location: [facility];  Service: Orthopedics;  Laterality: Right;   Family History  Problem Relation Age of Onset   Hyperlipidemia Mother    Cancer Father        colon, prostate   Social History   Socioeconomic History   Marital status: Married    Spouse name: Not on file   Number of children: Not on file   Years of education: Not on file   Highest education level: Master's degree (e.g., MA, MS, MEng, MEd, MSW, MBA)  Occupational History   Not on file  Tobacco Use   Smoking status: Former    Current packs/day: 0.00    Average packs/day: 1.2 packs/day for 10.0 years (12.0 ttl pk-yrs)    Types: Cigarettes    Start date: 06/09/1962    Quit date: 06/09/1968    Years since quitting: 55.9   Smokeless tobacco: Never   Tobacco comments:    Started smoking at 17 and quit at 25.  Vaping Use   Vaping status: Never  Used  Substance and Sexual Activity   Alcohol use: Yes    Comment: wine or cocktail - weekends   Drug use: No   Sexual activity: Yes    Birth control/protection: None  Other Topics Concern   Not on file  Social History Narrative   Not on file   Social Drivers of Health   Financial Resource Strain: Low Risk  (04/25/2024)   Overall Financial Resource Strain (CARDIA)    Difficulty of Paying Living Expenses: Not hard at all  Food Insecurity: No Food Insecurity (04/25/2024)   Hunger Vital Sign    Worried About Running Out of Food in the Last Year: Never true    Ran Out of Food in the  Last Year: Never true  Transportation Needs: No Transportation Needs (04/25/2024)   PRAPARE - Administrator, Civil Service (Medical): No    Lack of Transportation (Non-Medical): No  Physical Activity: Sufficiently Active (04/25/2024)   Exercise Vital Sign    Days of Exercise per Week: 7 days    Minutes of Exercise per Session: 120 min  Stress: No Stress Concern Present (04/25/2024)   [employer] of Occupational Health - Occupational Stress Questionnaire    Feeling of Stress : Not at all  Social Connections: Socially Integrated (04/25/2024)   Social Connection and Isolation Panel [NHANES]    Frequency of Communication with Friends and Family: More than three times a week    Frequency of Social Gatherings with Friends and Family: More than three times a week    Attends Religious Services: More than 4 times per year    Active Member of [employer] or Organizations: Yes    Attends Engineer, structural: More than 4 times per year    Marital Status: Married    Tobacco Counseling Counseling given: Not Answered Tobacco comments: Started smoking at 17 and quit at 25.    Clinical Intake:  Pre-visit preparation completed: Yes  Pain : No/denies pain     BMI - recorded: 26.98 Nutritional Status: BMI 25 -29 Overweight Nutritional Risks: None Diabetes: No  No results found for:  "HGBA1C"   How often do you need to have someone help you when you read instructions, pamphlets, or other written materials from your doctor or pharmacy?: 1 - Never  Interpreter Needed?: No  Information entered by :: Farris Hong LPN   Activities of Daily Living     04/25/2024   11:29 AM 04/18/2024   10:34 AM  In your present state of health, do you have any difficulty performing the following activities:  Hearing? 0 0  Vision? 0 0  Difficulty concentrating or making decisions? 0 0  Walking or climbing stairs? 0 0  Dressing or bathing? 0 0  Doing errands, shopping? 0 0  Preparing Food and eating ? N N  Using the Toilet? N N  In the past six months, have you accidently leaked urine? N N  Do you have problems with loss of bowel control? N N  Managing your Medications? N N  Managing your Finances? N N  Housekeeping or managing your Housekeeping? N N    Patient Care Team: Marquetta Sit, MD as PCP - Orbie Binder, MD as Consulting Physician (General Surgery) Tami Falcon, MD as Consulting Physician (Gastroenterology) Neil Balls, MD as Consulting Physician (Orthopedic Surgery) Garfield Jungling as Consulting Physician (Optometry)  Indicate any recent Medical Services you may have received from other than [facility] providers in the past year (date may be approximate).     Assessment:    This is a routine wellness examination for [place].  Hearing/Vision screen Hearing Screening - Comments:: Denies hearing difficulties   Vision Screening - Comments:: Wears rx glasses - up to date with routine eye exams with  Dr Alto Atta   Goals Addressed               This Visit's Progress     Remain Active (pt-stated)        Continue to be active socially.       Depression Screen     04/25/2024   11:28 AM 03/06/2024    8:37 AM 04/18/2023   12:43 PM 03/08/2023   10:35 AM 03/01/2023  8:27 AM 04/15/2022   10:38 AM 04/14/2021   11:14 AM  PHQ 2/9 Scores  PHQ - 2 Score 0 0 0 0 0 0  0  PHQ- 9 Score 0 1 0 0       Fall Risk     04/25/2024   11:28 AM 04/18/2024   10:34 AM 03/06/2024    8:08 AM 04/18/2023   12:45 PM 04/14/2023    8:47 AM  Fall Risk   Falls in the past year? 1 1 0 0 0  Number falls in past yr: 0 0 0 0   Injury with Fall? 1 1 0 0 0  Comment Fx 2 Ribs. Followed by medical attention      Risk for fall due to :   No Fall Risks No Fall Risks   Follow up Falls prevention discussed;Falls evaluation completed  Falls evaluation completed Falls prevention discussed     MEDICARE RISK AT HOME:  Medicare Risk at Home Any stairs in or around the home?: Yes If so, are there any without handrails?: No Home free of loose throw rugs in walkways, pet beds, electrical cords, etc?: Yes Adequate lighting in your home to reduce risk of falls?: Yes Life alert?: No Use of a cane, walker or w/c?: No Grab bars in the bathroom?: Yes Shower chair or bench in shower?: No Elevated toilet seat or a handicapped toilet?: Yes  TIMED UP AND GO:  Was the test performed?  No  Cognitive Function: 6CIT completed    09/23/2017    2:29 PM 07/30/2016    1:30 PM  MMSE - Mini Mental State Exam  Not completed: -- --        04/25/2024   11:32 AM 04/20/2023    8:09 AM 04/15/2022   10:42 AM 02/06/2020    1:22 PM  6CIT Screen  What Year? 0 points 0 points 0 points 0 points  What month? 0 points 0 points 0 points 0 points  What time? 0 points 0 points 0 points 0 points  Count back from 20 0 points 0 points 0 points 0 points  Months in reverse 0 points 0 points 0 points 0 points  Repeat phrase 0 points 0 points 0 points 0 points  Total Score 0 points 0 points 0 points 0 points    Immunizations Immunization History  Administered Date(s) Administered   Fluad Quad(high Dose 65+) 08/17/2019, 08/12/2022   Influenza Split 09/02/2011, 10/01/2012, 10/01/2013, 09/12/2014   Influenza Whole 09/12/2008, 09/12/2009, 09/11/2010   Influenza, High Dose Seasonal PF 09/23/2015, 09/14/2017,  08/25/2018, 08/23/2023   Influenza-Unspecified 09/28/2016, 07/29/2018, 08/17/2019, 08/13/2020, 08/27/2021, 07/26/2022, 08/23/2023   PFIZER Comirnaty(Gray Top)Covid-19 Tri-Sucrose Vaccine 03/26/2021   PFIZER(Purple Top)SARS-COV-2 Vaccination 01/17/2020, 02/12/2020, 06/12/2020, 09/10/2020   Pfizer Covid-19 Vaccine Bivalent Booster 42yrs & up 08/25/2021, 07/26/2022   Pfizer(Comirnaty)Fall Seasonal Vaccine 12 years and older 03/02/2023, 08/23/2023   Pneumococcal Conjugate-13 01/11/2014   Pneumococcal Polysaccharide-23 12/13/2006   Rsv, Bivalent, Protein Subunit Rsvpref,pf (Abrysvo) 08/23/2023   Td 03/25/1965, 12/14/1995, 03/13/2006   Tdap 01/19/2016   Typhoid Live 03/25/1965   Zoster Recombinant(Shingrix) 04/22/2017, 06/12/2017, 07/26/2017   Zoster, Live 12/13/2004    Screening Tests Health Maintenance  Topic Date Due   COVID-19 Vaccine (10 - 2024-25 season) 02/20/2024   INFLUENZA VACCINE  07/13/2024   Medicare Annual Wellness (AWV)  04/25/2025   DTaP/Tdap/Td (5 - Td or Tdap) 01/18/2026   Colonoscopy  07/05/2028   Zoster Vaccines- Shingrix  Completed   HPV VACCINES  Aged Out  Meningococcal B Vaccine  Aged Out   Pneumonia Vaccine 17+ Years old  Discontinued   Hepatitis C Screening  Discontinued    Health Maintenance  Health Maintenance Due  Topic Date Due   COVID-19 Vaccine (10 - 2024-25 season) 02/20/2024   Health Maintenance Items Addressed:   Additional Screening:  Vision Screening: Recommended annual ophthalmology exams for early detection of glaucoma and other disorders of the eye.  Dental Screening: Recommended annual dental exams for proper oral hygiene  Community Resource Referral / Chronic Care Management: CRR required this visit?  No   CCM required this visit?  No   Plan:    I have personally reviewed and noted the following in the patient's chart:   Medical and social history Use of alcohol, tobacco or illicit drugs  Current medications and  supplements including opioid prescriptions. Patient is not currently taking opioid prescriptions. Functional ability and status Nutritional status Physical activity Advanced directives List of other physicians Hospitalizations, surgeries, and ER visits in previous 12 months Vitals Screenings to include cognitive, depression, and falls Referrals and appointments  In addition, I have reviewed and discussed with patient certain preventive protocols, quality metrics, and best practice recommendations. A written personalized care plan for preventive services as well as general preventive health recommendations were provided to patient.   Dewayne Ford, LPN   [id number]   After Visit Summary: (MyChart) Due to this being a telephonic visit, the after visit summary with patients personalized plan was offered to patient via MyChart   Notes: Nothing significant to report at this time.

## 2024-05-16 ENCOUNTER — Ambulatory Visit (INDEPENDENT_AMBULATORY_CARE_PROVIDER_SITE_OTHER)

## 2024-05-16 DIAGNOSIS — R0600 Dyspnea, unspecified: Secondary | ICD-10-CM

## 2024-05-16 LAB — ECHOCARDIOGRAM COMPLETE
AV Vena cont: 0.17 cm
Area-P 1/2: 2.83 cm2
MV M vel: 3.14 m/s
MV Peak grad: 39.4 mmHg
P 1/2 time: 509 ms
S' Lateral: 2.32 cm

## 2024-05-17 ENCOUNTER — Ambulatory Visit: Payer: Self-pay | Admitting: Family Medicine

## 2024-05-18 ENCOUNTER — Encounter: Payer: Self-pay | Admitting: Family Medicine

## 2024-05-20 ENCOUNTER — Other Ambulatory Visit: Payer: Self-pay | Admitting: Family Medicine

## 2024-05-29 ENCOUNTER — Ambulatory Visit: Payer: Self-pay | Admitting: Family Medicine

## 2024-05-29 ENCOUNTER — Other Ambulatory Visit

## 2024-05-29 DIAGNOSIS — E785 Hyperlipidemia, unspecified: Secondary | ICD-10-CM

## 2024-05-29 LAB — BASIC METABOLIC PANEL WITH GFR
BUN: 13 mg/dL (ref 6–23)
CO2: 26 meq/L (ref 19–32)
Calcium: 9.4 mg/dL (ref 8.4–10.5)
Chloride: 105 meq/L (ref 96–112)
Creatinine, Ser: 0.92 mg/dL (ref 0.40–1.50)
GFR: 77.64 mL/min (ref >60.00)
Glucose, Bld: 102 mg/dL — ABNORMAL HIGH (ref 70–99)
Potassium: 4.2 meq/L (ref 3.5–5.1)
Sodium: 139 meq/L (ref 135–145)

## 2024-05-29 LAB — HEMOGLOBIN A1C: Hgb A1c MFr Bld: 5.7 % (ref 4.6–6.5)

## 2024-06-05 ENCOUNTER — Ambulatory Visit: Admitting: Family Medicine

## 2024-06-05 ENCOUNTER — Encounter: Payer: Self-pay | Admitting: Family Medicine

## 2024-06-05 VITALS — BP 128/66 | HR 61 | Temp 97.6°F | Wt 192.5 lb

## 2024-06-05 DIAGNOSIS — R0609 Other forms of dyspnea: Secondary | ICD-10-CM | POA: Diagnosis not present

## 2024-06-05 DIAGNOSIS — R7303 Prediabetes: Secondary | ICD-10-CM

## 2024-06-05 DIAGNOSIS — R42 Dizziness and giddiness: Secondary | ICD-10-CM

## 2024-06-05 NOTE — Patient Instructions (Signed)
 I will be setting up cardiology referral to evaluate exertional dyspnea (shortness of breath).

## 2024-06-05 NOTE — Progress Notes (Signed)
 Established Patient Office Visit  Subjective   Patient ID: Hector Lewis, male    DOB: Mar 19, 1941  Age: 83 y.o. MRN: [id number]  Chief Complaint  Patient presents with   Medical Management of Chronic Issues    HPI   Hector Lewis has history of hypertension, GERD, adenomatous colon polyps, hyperlipidemia.  Back in March he had a fall with fracture to the left seventh rib and possibly eighth.  He noticed at the time of fall and persisting until now increased dyspnea with exertion.  No chest pain.  Has also had some increased dizziness.  He specifically notices with things like stairs.  He had chest x-ray May 5 which showed no acute findings.  He feels like his rib fractures are finally healing with no pain at this point.  We have sent him for echo which showed trivial mitral valve regurgitation.  He had moderate aortic valve calcification but no significant aortic valve stenosis.  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  Denies any recent peripheral edema.  No orthopnea.  Does some water  exercises in a class where he gets his heart rate up and has not noted any significant exertional dyspnea with that but has consistently with stairs.  Also has dizziness with negotiating stairs but no syncope.  Recent EKG unremarkable.  No cough.  No fever.  No family history of premature CAD.  Recent mildly elevated fasting glucose.  A1c came back 5.7%.  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   Cataract 12/2021   Bilaterial cataract surgery   Complication of anesthesia    had urinary retention after surgery cause elevated heart rate and severe abd pain once catheterized symptoms resolved   GERD 12/29/2010   History of colon polyps    HYPERLIPIDEMIA 05/30/2009   Hyperthyroidism 1980's   resolved now, took medicine at the time   MEDIAL MENISCUS TEAR,  RIGHT 10/17/2008   METATARSALGIA 01/27/2010   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   White coat syndrome with hypertension    no medications   Past Surgical History:  Procedure Laterality Date   APPENDECTOMY     done with colectomy   COLON SURGERY  2008   precancerous polyps   COLONOSCOPY  05/2018   EYE SURGERY     HAMMER TOE SURGERY  2012   HERNIA REPAIR  July 2019   Bilateral inguinal repair   INGUINAL HERNIA REPAIR N/A 06/22/2018   Procedure: LAPAROSCOPIC BILATERAL INGUINAL HERNIA REPAIR;  Surgeon: Sheldon Standing, MD;  Location: [facility];  Service: General;  Laterality: N/A;   INSERTION OF MESH N/A 06/22/2018   Procedure: INSERTION OF MESH;  Surgeon: Sheldon Standing, MD;  Location: [facility];  Service: General;  Laterality: N/A;   JOINT REPLACEMENT  2017   Right knee replacement   KNEE ARTHROSCOPY Right    PARTIAL COLECTOMY  2008   TONSILLECTOMY     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   Procedure: TOTAL KNEE ARTHROPLASTY;  Surgeon: Norleen Gavel, MD;  Location: [facility];  Service: Orthopedics;  Laterality: Right;    reports that he quit smoking about 56 years ago. His smoking use included cigarettes. He started smoking about 62 years ago. He has a 12 pack-year smoking history. He has never used smokeless tobacco.  He reports current alcohol use. He reports that he does not use drugs. family history includes Cancer in his father; Hyperlipidemia in his mother. Allergies  Allergen Reactions   Codeine Sulfate Itching and Rash    Review of Systems  Constitutional:  Negative for chills, fever and malaise/fatigue.  Eyes:  Negative for blurred vision.  Respiratory:  Positive for shortness of breath. Negative for cough, hemoptysis, sputum production and wheezing.   Cardiovascular:  Negative for chest pain.  Neurological:  Positive for dizziness. Negative for loss of consciousness, weakness and headaches.      Objective:     BP 128/66 (BP Location: Left  Arm, Patient Position: Sitting, Cuff Size: Normal)   Pulse 61   Temp 97.6 F (36.4 C) (Oral)   Wt 192 lb 8 oz (87.3 kg)   SpO2 95%   BMI 27.62 kg/m  BP Readings from Last 3 Encounters:  06/05/24 128/66  04/20/24 130/62  03/13/24 132/78   Wt Readings from Last 3 Encounters:  06/05/24 192 lb 8 oz (87.3 kg)  04/25/24 188 lb (85.3 kg)  04/20/24 188 lb 11.2 oz (85.6 kg)      Physical Exam Vitals reviewed.  Constitutional:      Appearance: He is well-developed.   Eyes:     Pupils: Pupils are equal, round, and reactive to light.   Neck:     Thyroid : No thyromegaly.   Cardiovascular:     Rate and Rhythm: Normal rate and regular rhythm.     Comments: Soft 1/6 systolic ejection murmur right upper sternal border Pulmonary:     Effort: Pulmonary effort is normal. No respiratory distress.     Breath sounds: Normal breath sounds. No wheezing or rales.   Musculoskeletal:     Cervical back: Neck supple.     Right lower leg: No edema.     Left lower leg: No edema.   Neurological:     Mental Status: He is alert and oriented to person, place, and time.      No results found for any visits on 06/05/24.  Last CBC Lab Results  Component Value Date   WBC 8.1 03/11/2024   HGB 15.2 03/11/2024   HCT 43.7 03/11/2024   MCV 90.7 03/11/2024   MCH 31.5 03/11/2024   RDW 12.4 03/11/2024   PLT 177 03/11/2024   Last metabolic panel Lab Results  Component Value Date   GLUCOSE 102 (H) 05/29/2024   NA 139 05/29/2024   K 4.2 05/29/2024   CL 105 05/29/2024   CO2 26 05/29/2024   BUN 13 05/29/2024   CREATININE 0.92 05/29/2024   GFR 77.64 05/29/2024   CALCIUM  9.4 05/29/2024   PROT 7.8 03/06/2024   ALBUMIN 4.7 03/06/2024   BILITOT 1.2 03/06/2024   ALKPHOS 79 03/06/2024   AST 30 03/06/2024   ALT 36 03/06/2024   ANIONGAP 9 03/11/2024   Last lipids Lab Results  Component Value Date   CHOL 160 03/06/2024   HDL 43.00 03/06/2024   LDLCALC 90 03/06/2024   LDLDIRECT 131.2  01/04/2014   TRIG 134.0 03/06/2024   CHOLHDL 4 03/06/2024   Last hemoglobin A1c Lab Results  Component Value Date   HGBA1C 5.7 05/29/2024      The ASCVD Risk score (Arnett DK, et al., 2019) failed to calculate for the following reasons:   The 2019 ASCVD risk score is only valid for ages 65 to 31    Assessment & Plan:   #1 exertional dyspnea.  He relates onset  around the time of fall with left seventh and possibly eighth rib fracture.  X-rays showed no evidence for pneumothorax.  He has never had any exertional chest pain but has had some dyspnea and dizziness without syncope with things like negotiating stairs.  This is entirely new symptom which did not occur until his trauma.  Recent echo mostly unremarkable with only trivial mitral valve regurg and no aortic valve stenosis.  He has no history of smoking since 1969 with only 12-pack-year history and no chronic lung issues.  I think angina equivalent is fairly unlikely but will set up cardiology referral for their opinion.  #2 borderline prediabetes.  Discussed dietary management.  He is very health-conscious overall and recommend continued exercise as tolerated Consider yearly follow-up A1c  Wolm Scarlet, MD

## 2024-06-13 ENCOUNTER — Encounter: Payer: Self-pay | Admitting: Family Medicine

## 2024-09-12 ENCOUNTER — Other Ambulatory Visit (HOSPITAL_BASED_OUTPATIENT_CLINIC_OR_DEPARTMENT_OTHER): Payer: Self-pay

## 2024-09-12 MED ORDER — GABAPENTIN 100 MG PO CAPS: ORAL_CAPSULE | ORAL | 6 refills | Status: DC

## 2024-09-14 ENCOUNTER — Encounter (HOSPITAL_BASED_OUTPATIENT_CLINIC_OR_DEPARTMENT_OTHER): Payer: Self-pay | Admitting: Cardiology

## 2024-09-14 ENCOUNTER — Encounter (HOSPITAL_BASED_OUTPATIENT_CLINIC_OR_DEPARTMENT_OTHER): Payer: Self-pay | Admitting: *Deleted

## 2024-09-14 ENCOUNTER — Ambulatory Visit (HOSPITAL_BASED_OUTPATIENT_CLINIC_OR_DEPARTMENT_OTHER): Admitting: Cardiology

## 2024-09-14 VITALS — BP 140/82 | HR 70 | Ht 70.0 in | Wt 195.8 lb

## 2024-09-14 DIAGNOSIS — R0609 Other forms of dyspnea: Secondary | ICD-10-CM | POA: Diagnosis not present

## 2024-09-14 DIAGNOSIS — Z712 Person consulting for explanation of examination or test findings: Secondary | ICD-10-CM | POA: Diagnosis not present

## 2024-09-14 DIAGNOSIS — I10 Essential (primary) hypertension: Secondary | ICD-10-CM

## 2024-09-14 DIAGNOSIS — Z7189 Other specified counseling: Secondary | ICD-10-CM | POA: Diagnosis not present

## 2024-09-14 DIAGNOSIS — R011 Cardiac murmur, unspecified: Secondary | ICD-10-CM

## 2024-09-14 NOTE — Patient Instructions (Signed)
 Medication Instructions:  No changes *If you need a refill on your cardiac medications before your next appointment, please call your pharmacy*  Lab Work: none   Testing/Procedures: Your physician has requested that you have en exercise stress myoview. For further information please visit [email or web address] Please follow instruction sheet, as given.   Follow-Up: Based on test results

## 2024-09-14 NOTE — Progress Notes (Signed)
 Cardiology Office Note:  .   Date:  09/14/2024  ID:  Hector Lewis, DOB 05/08/1941, MRN [id number] PCP: Micheal Wolm ORN, MD  [facility] Providers Cardiologist:  Shelda Bruckner, MD {  History of Present Illness: .   Hector Lewis is a 83 y.o. male with PMH hypertension, hyperlipidemia, GERD who is seen as a new patient evaluation for exertional dyspnea at the request of Dr. Micheal.  Referral from 06/05/24 reviewed. Noted that he tolerated water  exercises without significant symptoms but had persistent dyspnea on exertion with climbing stairs since having a fall resulting in rib fractures. Referred to cardiology for further evaluation.  Notice shortness of breath, lightheadedness, and fatigue while he was on tramadol  earlier in the year (managing pain from rib fractures). He typically had been taking the stairs to his apartment but noted that he was very lightheaded and short of breath at the top of the stairs.  [facility]io, aerobics, weights) for 55 minutes. No symptoms with this. But the stairs continue to consistently given him symptoms. Also feels like his legs are weak only when climbing the stairs.  We reviewed his echocardiogram together today.  No family history of heart disease. Dad lived to 83, mother lived to 10, died of old age.   ROS: Denies chest pain, shortness of breath at rest or with normal exertion (other than stairs). No PND, orthopnea, LE edema or unexpected weight gain. No syncope or palpitations. ROS otherwise negative except as noted.   Studies Reviewed: SABRA    EKG:  EKG Interpretation Date/Time:  Friday September 14 2024 11:23:14 EDT Ventricular Rate:  70 PR Interval:  196 QRS Duration:  92 QT Interval:  372 QTC Calculation: 401 R Axis:   -59  Text Interpretation: Sinus rhythm with marked sinus arrhythmia Left anterior fascicular block Confirmed by Bruckner Shelda [phone]) on  09/14/2024 11:36:25 AM    Physical Exam:   VS:  BP (!) 140/82   Pulse 70   Ht 5' 10 (1.778 m)   Wt 195 lb 12.8 oz (88.8 kg)   SpO2 94%   BMI 28.09 kg/m    Wt Readings from Last 3 Encounters:  09/14/24 195 lb 12.8 oz (88.8 kg)  06/05/24 192 lb 8 oz (87.3 kg)  04/25/24 188 lb (85.3 kg)    GEN: Well nourished, well developed in no acute distress HEENT: Normal, moist mucous membranes NECK: No JVD CARDIAC: regular rhythm, normal S1 and S2, no rubs or gallops. 1/6 systolic murmur. VASCULAR: Radial and DP pulses 2+ bilaterally. No carotid bruits RESPIRATORY:  Clear to auscultation without rales, wheezing or rhonchi  ABDOMEN: Soft, non-tender, non-distended MUSCULOSKELETAL:  Ambulates independently SKIN: Warm and dry, no edema NEUROLOGIC:  Alert and oriented x 3. No focal neuro deficits noted. PSYCHIATRIC:  Normal affect    ASSESSMENT AND PLAN: .    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 After shared decision making, will pursue treadmill nuclear stress given LAFB/arrhythmia and risk for inconclusive treadmill alone. I also reviewed his echo images and have concerns that we may not be able to see all wall motion on a stress echo. Stress nuclear also would give us  the option to complete the evaluation if we cannot get his heart rate to target (ie can convert to lexiscan). Ideally though the treadmill  portion would allow us  to exclude chronotropic incompetence and hypertensive exercise response as potential etiologies as well  Informed Consent   Shared Decision Making/Informed Consent{  The risks [chest pain, shortness of breath, cardiac arrhythmias, dizziness, blood pressure fluctuations, myocardial infarction, stroke/transient ischemic attack, nausea, vomiting, allergic reaction, radiation exposure,  metallic taste sensation and life-threatening complications (estimated to be 1 in 10,000)], benefits (risk stratification, diagnosing coronary artery disease, treatment guidance) and alternatives of a nuclear stress test were discussed in detail with Mr. Hantz and he agrees to proceed.      Hypertension -typically well controlled, continue current regimen  CV risk counseling and prevention -recommend heart healthy/Mediterranean diet, with whole grains, fruits, vegetable, fish, lean meats, nuts, and olive oil. Limit salt. -recommend moderate walking, 3-5 times/week for 30-50 minutes each session. Aim for at least 150 minutes/week. Goal should be pace of 3 miles/hours, or walking 1.5 miles in 30 minutes -recommend avoidance of tobacco products. Avoid excess alcohol. -has been on statin for primary prevention since his 1s  Dispo: to be determined based on results of testing. If testing unremarkable, I would be happy to see him back as needed  Signed, Shelda Bruckner, MD   Shelda Bruckner, MD, PhD, [facility]  [facility]  [place]  [facility] at [facility] [address] [place], [place] [address] [phone]

## 2024-09-17 ENCOUNTER — Encounter (HOSPITAL_COMMUNITY): Payer: Self-pay

## 2024-09-21 ENCOUNTER — Other Ambulatory Visit (HOSPITAL_BASED_OUTPATIENT_CLINIC_OR_DEPARTMENT_OTHER): Payer: Self-pay | Admitting: Cardiology

## 2024-09-21 DIAGNOSIS — R0609 Other forms of dyspnea: Secondary | ICD-10-CM

## 2024-09-24 ENCOUNTER — Telehealth (HOSPITAL_COMMUNITY): Payer: Self-pay

## 2024-09-24 NOTE — Telephone Encounter (Signed)
Spoke with the patient, detailed instructions given. He stated that he would be here for his test. S.Dannell Gortney CCT

## 2024-09-25 ENCOUNTER — Ambulatory Visit (HOSPITAL_COMMUNITY)
Admission: RE | Admit: 2024-09-25 | Discharge: 2024-09-25 | Disposition: A | Discharge status: Home / Self Care | Source: Ambulatory Visit | Attending: Internal Medicine | Admitting: Internal Medicine

## 2024-09-25 DIAGNOSIS — R0609 Other forms of dyspnea: Secondary | ICD-10-CM | POA: Diagnosis not present

## 2024-09-25 DIAGNOSIS — I700 Atherosclerosis of aorta: Secondary | ICD-10-CM | POA: Diagnosis not present

## 2024-09-25 LAB — MYOCARDIAL PERFUSION IMAGING
Angina Index: 0
Duke Treadmill Score: 5
Estimated workload: 7
Exercise duration (min): 5 min
Exercise duration (sec): 1 s
LV dias vol: 68 mL (ref 62–150)
LV sys vol: 14 mL (ref 4.2–5.8)
MPHR: 138 {beats}/min
Nuc Stress EF: 79 %
Peak HR: 125 {beats}/min
Percent HR: 90 %
Rest HR: 77 {beats}/min
Rest Nuclear Isotope Dose: 10.3 mCi
SDS: 0
SRS: 2
SSS: 2
ST Depression (mm): 0 mm
Stress Nuclear Isotope Dose: 32.7 mCi
TID: 0.62

## 2024-09-25 MED ORDER — TECHNETIUM TC 99M TETROFOSMIN IV KIT
10.3000 | PACK | Freq: Once | INTRAVENOUS | Status: AC | PRN
  Administered 2024-09-25: 10.3 via INTRAVENOUS

## 2024-09-25 MED ORDER — TECHNETIUM TC 99M TETROFOSMIN IV KIT
32.7000 | PACK | Freq: Once | INTRAVENOUS | Status: AC | PRN
  Administered 2024-09-25: 32.7 via INTRAVENOUS

## 2024-09-27 ENCOUNTER — Encounter (HOSPITAL_BASED_OUTPATIENT_CLINIC_OR_DEPARTMENT_OTHER): Payer: Self-pay

## 2024-10-02 DIAGNOSIS — Z85828 Personal history of other malignant neoplasm of skin: Secondary | ICD-10-CM | POA: Diagnosis not present

## 2024-10-02 DIAGNOSIS — E785 Hyperlipidemia, unspecified: Secondary | ICD-10-CM | POA: Diagnosis not present

## 2024-10-02 DIAGNOSIS — L309 Dermatitis, unspecified: Secondary | ICD-10-CM | POA: Diagnosis not present

## 2024-10-02 DIAGNOSIS — K219 Gastro-esophageal reflux disease without esophagitis: Secondary | ICD-10-CM | POA: Diagnosis not present

## 2024-10-02 DIAGNOSIS — I10 Essential (primary) hypertension: Secondary | ICD-10-CM | POA: Diagnosis not present

## 2024-10-02 DIAGNOSIS — Z87891 Personal history of nicotine dependence: Secondary | ICD-10-CM | POA: Diagnosis not present

## 2024-10-02 DIAGNOSIS — J309 Allergic rhinitis, unspecified: Secondary | ICD-10-CM | POA: Diagnosis not present

## 2024-10-02 DIAGNOSIS — Z9181 History of falling: Secondary | ICD-10-CM | POA: Diagnosis not present

## 2024-10-02 DIAGNOSIS — G629 Polyneuropathy, unspecified: Secondary | ICD-10-CM | POA: Diagnosis not present

## 2024-10-09 ENCOUNTER — Ambulatory Visit (HOSPITAL_BASED_OUTPATIENT_CLINIC_OR_DEPARTMENT_OTHER): Payer: Self-pay | Admitting: Family

## 2024-10-09 NOTE — Telephone Encounter (Signed)
 The patient has been notified of the result and verbalized understanding.  All questions (if any) were answered.   6 month recall with Dr. Lonni placed and pt aware we will call him to arrange this appt when the schedule opens.     Advised him to continue taking his statin and fish oil, as well as exercising with moderate intensity 150 mins per week, and eating heart healthy diet.    Pt mentioned since his fall a couple months ago when he broke several ribs, it has been hard to walk large flight of stairs without getting winded.  Pt is inquiring what to do about this matter, being his cardiac work-up has come back reassuring.  Advised the pt to follow-up with his PCP about this, for work-up of non-cardiac systems.     Pt verbalized understanding and agrees with this plan.

## 2024-10-16 ENCOUNTER — Encounter: Payer: Self-pay | Admitting: Family Medicine

## 2024-10-16 ENCOUNTER — Ambulatory Visit: Admitting: Family Medicine

## 2024-10-16 VITALS — BP 136/64 | HR 67 | Temp 97.9°F | Wt 191.9 lb

## 2024-10-16 DIAGNOSIS — R0609 Other forms of dyspnea: Secondary | ICD-10-CM | POA: Diagnosis not present

## 2024-10-16 NOTE — Progress Notes (Signed)
 Established Patient Office Visit  Subjective   Patient ID: Hector Lewis, male    DOB: 1941/09/25  Age: 83 y.o. MRN: [id number]  Chief Complaint  Patient presents with   Medical Management of Chronic Issues    HPI   [name] is an 83 year old male who is very health-conscious who has history of hypertension, GERD, hyperlipidemia.  Back in March she had a fall with several rib fractures.  No evidence for pneumothorax at that time.  Although his pain has resolved he has had some persistent dizziness, fatigue, and dyspnea with certain activities and especially hills and stairs since the fall.  He had follow-up chest x-ray in May which showed no acute abnormalities.  Echocardiogram in May showed EF of 65 to 70% and no major valve dysfunction.  We ended up referring him to cardiology and he had recent nuclear exercise stress test October 14 which was felt to be a low risk study.  Recommendation was to look for other causes for his exertional dyspnea.  He has no chronic lung issues.  Quit smoking 1969 after about 12-pack-year history.  Achieved 5 minutes on Kayleen Alig protocol recently and has fair exercise tolerance in general.  Denies any recent cough.  No pleuritic pain.  Interestingly, he seems to have good exercise tolerance when doing exercises that are not inclined related.  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   Cataract 12/2021   Bilaterial cataract surgery   Complication of anesthesia    had urinary retention after surgery cause elevated heart rate and severe abd pain once catheterized symptoms resolved   GERD 12/29/2010   History of colon polyps    HYPERLIPIDEMIA 05/30/2009   Hyperthyroidism 1980's   resolved now, took medicine at the time    MEDIAL MENISCUS TEAR, RIGHT 10/17/2008   METATARSALGIA 01/27/2010   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   White coat syndrome with hypertension    no medications   Past Surgical History:  Procedure Laterality Date   APPENDECTOMY     done with colectomy   COLON SURGERY  2008   precancerous polyps   COLONOSCOPY  05/2018   EYE SURGERY     HAMMER TOE SURGERY  2012   HERNIA REPAIR  July 2019   Bilateral inguinal repair   INGUINAL HERNIA REPAIR N/A 06/22/2018   Procedure: LAPAROSCOPIC BILATERAL INGUINAL HERNIA REPAIR;  Surgeon: Sheldon Standing, MD;  Location: [facility];  Service: General;  Laterality: N/A;   INSERTION OF MESH N/A 06/22/2018   Procedure: INSERTION OF MESH;  Surgeon: Sheldon Standing, MD;  Location: [facility];  Service: General;  Laterality: N/A;   JOINT REPLACEMENT  2017   Right knee replacement   KNEE ARTHROSCOPY Right    PARTIAL COLECTOMY  2008   TONSILLECTOMY     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   Procedure: TOTAL KNEE ARTHROPLASTY;  Surgeon: Norleen Gavel, MD;  Location: [facility];  Service: Orthopedics;  Laterality: Right;    reports that he quit smoking about 56 years ago. His smoking use included cigarettes. He started smoking about 62 years ago. He has a 12 pack-year smoking history. He has never used smokeless tobacco.  He reports current alcohol use. He reports that he does not use drugs. family history includes Cancer in his father; Hyperlipidemia in his mother. Allergies  Allergen Reactions   Other     Seasonal hay fever - sneezing, eyes watering   Codeine Sulfate Itching and Rash    Review of Systems  Constitutional:  Positive for malaise/fatigue. Negative for chills, fever and weight loss.  Eyes:  Negative for blurred vision.  Respiratory:  Positive for shortness of breath. Negative for cough, hemoptysis, sputum production and wheezing.        See HPI  Cardiovascular:  Negative for chest pain.  Neurological:  Positive for  dizziness. Negative for weakness and headaches.      Objective:     BP 136/64   Pulse 67   Temp 97.9 F (36.6 C) (Oral)   Wt 191 lb 14.4 oz (87 kg)   SpO2 96% Comment: After walk test  BMI 27.53 kg/m  BP Readings from Last 3 Encounters:  10/16/24 136/64  09/14/24 (!) 140/82  06/05/24 128/66   Wt Readings from Last 3 Encounters:  10/16/24 191 lb 14.4 oz (87 kg)  09/14/24 195 lb 12.8 oz (88.8 kg)  06/05/24 192 lb 8 oz (87.3 kg)      Physical Exam Vitals reviewed.  Constitutional:      General: He is not in acute distress.    Appearance: He is not ill-appearing.  Cardiovascular:     Rate and Rhythm: Normal rate and regular rhythm.  Pulmonary:     Effort: Pulmonary effort is normal. No respiratory distress.     Breath sounds: Normal breath sounds. No wheezing or rales.  Musculoskeletal:     Right lower leg: No edema.     Left lower leg: No edema.  Neurological:     Mental Status: He is alert.      No results found for any visits on 10/16/24.    The ASCVD Risk score (Arnett DK, et al., 2019) failed to calculate for the following reasons:   The 2019 ASCVD risk score is only valid for ages 57 to 37    Assessment & Plan:   Problem List Items Addressed This Visit   None Visit Diagnoses       Exertional dyspnea    -  Primary   Relevant Orders   Ambulatory referral to Pulmonology     Patient relates several months of persistent dizziness following multiple rib fractures last March.  Chest x-ray has been unrevealing.  Echocardiogram normal EF with no major valve issues.  Nuclear stress test per cardiology low risk.  He has dyspnea has been specifically with things like stairs and hills.  He has decent tolerance with activity at rest.  We had him ambulate in the hallways here with pulse oximetry and O2 sats were 96% at rest and maintained at 96% throughout his walking.  There been suggestion from cardiology to consider pulmonary referral.  Doubt he has any  significant lung disease but we do not have a good explanation for why he is having some dyspnea at this point.  We did mention checking other labs such as CBC but clinically no indication of anemia  Will set up pulmonary referral for further evaluation  No follow-ups on file.    Wolm Scarlet, MD

## 2024-10-31 DIAGNOSIS — D044 Carcinoma in situ of skin of scalp and neck: Secondary | ICD-10-CM | POA: Diagnosis not present

## 2024-10-31 DIAGNOSIS — L820 Inflamed seborrheic keratosis: Secondary | ICD-10-CM | POA: Diagnosis not present

## 2024-10-31 DIAGNOSIS — D485 Neoplasm of uncertain behavior of skin: Secondary | ICD-10-CM | POA: Diagnosis not present

## 2024-10-31 DIAGNOSIS — D0439 Carcinoma in situ of skin of other parts of face: Secondary | ICD-10-CM | POA: Diagnosis not present

## 2024-10-31 DIAGNOSIS — L308 Other specified dermatitis: Secondary | ICD-10-CM | POA: Diagnosis not present

## 2024-10-31 DIAGNOSIS — D225 Melanocytic nevi of trunk: Secondary | ICD-10-CM | POA: Diagnosis not present

## 2024-10-31 DIAGNOSIS — Z85828 Personal history of other malignant neoplasm of skin: Secondary | ICD-10-CM | POA: Diagnosis not present

## 2024-10-31 DIAGNOSIS — L570 Actinic keratosis: Secondary | ICD-10-CM | POA: Diagnosis not present

## 2024-10-31 DIAGNOSIS — L821 Other seborrheic keratosis: Secondary | ICD-10-CM | POA: Diagnosis not present

## 2024-11-19 ENCOUNTER — Encounter (HOSPITAL_BASED_OUTPATIENT_CLINIC_OR_DEPARTMENT_OTHER): Payer: Self-pay

## 2024-11-19 ENCOUNTER — Telehealth (HOSPITAL_BASED_OUTPATIENT_CLINIC_OR_DEPARTMENT_OTHER): Payer: Self-pay | Admitting: Pulmonary Disease

## 2024-11-19 NOTE — Telephone Encounter (Signed)
 Left voicemail for patient regarding his 9am appointment on 12/9. [facility] is functioning under a 2 hour delay given the poor weather conditions. When patient calls back, please transfer to [facility] front desk as we have overbook capabilities.

## 2024-11-20 ENCOUNTER — Ambulatory Visit (HOSPITAL_BASED_OUTPATIENT_CLINIC_OR_DEPARTMENT_OTHER): Admitting: Pulmonary Disease

## 2024-11-22 ENCOUNTER — Encounter (HOSPITAL_BASED_OUTPATIENT_CLINIC_OR_DEPARTMENT_OTHER): Payer: Self-pay | Admitting: Pulmonary Disease

## 2024-11-22 ENCOUNTER — Ambulatory Visit (HOSPITAL_BASED_OUTPATIENT_CLINIC_OR_DEPARTMENT_OTHER): Admitting: Pulmonary Disease

## 2024-11-22 VITALS — BP 134/76 | HR 80 | Ht 70.0 in | Wt 191.3 lb

## 2024-11-22 DIAGNOSIS — Z87891 Personal history of nicotine dependence: Secondary | ICD-10-CM | POA: Diagnosis not present

## 2024-11-22 DIAGNOSIS — R0600 Dyspnea, unspecified: Secondary | ICD-10-CM

## 2024-11-22 DIAGNOSIS — R0602 Shortness of breath: Secondary | ICD-10-CM

## 2024-11-22 NOTE — Patient Instructions (Addendum)
 Shortness of breath --ORDER pulmonary function test. Will call with results

## 2024-11-22 NOTE — Progress Notes (Signed)
 Subjective:   PATIENT ID: Hector Lewis GENDER: male DOB: [birth year], MRN: [id number]  Chief Complaint  Patient presents with   Consult    Shortness of breath    Reason for Visit: New consult for shortness of breath     Hector Lewis is a 83 y.o. 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  History of Present Illness Hector Lewis is an 83 year old male who presents with shortness of breath. He was referred by Dr. Sherlean for further evaluation of his shortness of breath.  He has been experiencing shortness of breath since the end of March. The initial episode occurred after standing up quickly from a recliner, taking a few steps, and feeling lightheaded, which led to a fall on his left side. He did not lose consciousness and was able to get up on his own. Later that evening, he experienced pain on his left side, prompting a visit to the emergency department the following day. He underwent chest x-rays and a head scan after his fall. He was prescribed tramadol  for pain management.  Following the initial incident, he resumed regular activities, including attending cardiovascular exercise classes at the [facility] three times a week. He can increase his heart rate to 100-120 bpm during these sessions without experiencing breathing difficulties. However, he noted difficulty climbing stairs to his third-floor apartment, experiencing panting and lightheadedness by the second floor, and needing to brace himself against the wall by the third floor. He underwent an echocardiogram and a myocardial perfusion study after experiencing shortness of breath.  Since December, his symptoms have improved, and he can now climb the stairs to his apartment  without difficulty. No history of asthma, chronic bronchitis, or significant smoking history, having only smoked briefly from ages 37 to 19. He denies any current use of tobacco, marijuana, or vaping products. No wheezing, coughing, or nighttime awakenings due to breathing issues are reported.  He has not used any inhalers during this period. He worked in the school system for thirty years and later with [employer] in the [employer], with no significant environmental exposures noted. He mentions potential dust exposure in the bedroom but no other inhalation concerns.      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   Cataract 12/2021   Bilaterial cataract surgery   Complication of anesthesia    had urinary retention after surgery cause elevated heart rate and severe abd pain once catheterized symptoms resolved   GERD 12/29/2010   History of colon polyps    HYPERLIPIDEMIA 05/30/2009   Hyperthyroidism 1980's   resolved now, took medicine at the time   MEDIAL MENISCUS TEAR, RIGHT 10/17/2008   METATARSALGIA 01/27/2010   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  Vaping Use   Vaping status: Never Used  Substance and Sexual Activity   Alcohol use: Yes    Comment: wine or  cocktail - weekends   Drug use: No   Sexual activity: Yes    Birth control/protection: None    Allergies[1]   Outpatient Medications Prior to Visit  Medication Sig Dispense Refill   amLODipine  (NORVASC ) 5 MG tablet Take 1 tablet (5 mg total) by mouth daily. 90 tablet 3   atorvastatin  (LIPITOR) 20 MG tablet Take 1 tablet (20 mg total) by mouth daily. 90 tablet 3   levocetirizine (XYZAL ALLERGY 24HR) 5 MG tablet Take 5 mg by mouth daily as needed (seasonal allergies).     losartan  (COZAAR ) 100 MG tablet Take 1 tablet (100 mg total) by mouth daily. 90 tablet 3   Multiple Vitamin (MULTIVITAMIN) capsule Take 1 capsule by mouth daily.     Omega-3 Fatty Acids (FISH OIL) 1200 MG CAPS Take 1,200 mg by mouth 2 (two) times daily.     pantoprazole  (PROTONIX ) 40 MG tablet Take 1 tablet (40 mg total) by mouth daily. 90 tablet 3   Polyvinyl Alcohol-Povidone (REFRESH OP) Place 1 drop into both eyes daily as needed (dry eyes).     Probiotic Product (ULTRAFLORA IMMUNE HEALTH PO) Take 1 capsule by mouth daily.      No facility-administered medications prior to visit.    Review of Systems  Constitutional:  Negative for chills, diaphoresis, fever, malaise/fatigue and weight loss.  HENT:  Negative for congestion.   Respiratory:  Positive for shortness of breath. Negative for cough, hemoptysis, sputum production and wheezing.   Cardiovascular:  Negative for chest pain, palpitations and leg swelling.     Objective:   Vitals:   11/22/24 1038  BP: 134/76  Pulse: 80  SpO2: 98%  Weight: 191 lb 4.8 oz (86.8 kg)  Height: 5' 10 (1.778 m)   SpO2: 98 %  Physical Exam: General: Well-appearing, no acute distress HENT: [place], AT Eyes: EOMI, no scleral icterus Respiratory: Clear to auscultation bilaterally.  No crackles, wheezing or rales Cardiovascular: RRR, -M/R/G, no JVD Extremities:-Edema,-tenderness Neuro: AAO x4, CNII-XII grossly intact Psych: Normal mood, normal affect  Data  Reviewed:  Imaging: CXR 03/11/24 - Acute fracture of left 7th/8th rib, no effusion edema or infiltrate CXR 04/20/24 - no infiltrate effusion or edema  PFT: None on file  Labs: CBC    Component Value Date/Time   WBC 8.1 03/11/2024 1226   RBC 4.82 03/11/2024 1226   HGB 15.2 03/11/2024 1226   HCT 43.7 03/11/2024 1226   PLT 177 03/11/2024 1226   MCV 90.7 03/11/2024 1226   MCH 31.5 03/11/2024 1226   MCHC 34.8 03/11/2024 1226   RDW 12.4 03/11/2024 1226   LYMPHSABS 1.6 03/06/2024 0837   MONOABS 0.3 03/06/2024 0837   EOSABS 0.2 03/06/2024 0837   BASOSABS 0.1 03/06/2024 0837  Normal blood counts     Latest Ref Rng & Units 05/29/2024    8:53 AM 03/11/2024   12:26 PM 03/06/2024  8:37 AM  CMP  Glucose 70 - 99 mg/dL 897  89  894   BUN 6 - 23 mg/dL 13  21  18    Creatinine 0.40 - 1.50 mg/dL 9.07  8.95  8.95   Sodium 135 - 145 mEq/L 139  138  138   Potassium 3.5 - 5.1 mEq/L 4.2  3.9  4.1   Chloride 96 - 112 mEq/L 105  105  103   CO2 19 - 32 mEq/L 26  24  26    Calcium  8.4 - 10.5 mg/dL 9.4  9.3  9.7   Total Protein 6.0 - 8.3 g/dL   7.8   Total Bilirubin 0.2 - 1.2 mg/dL   1.2   Alkaline Phos 39 - 117 U/L   79   AST 0 - 37 U/L   30   ALT 0 - 53 U/L   36   Normal BMET and normal LFTs earlier this year  Cardiac: Echo 04/20/24 EF normal, no WMA, normal size ventricles. Moderate calcification of aortic valve with mild AR Myocardial perfusion 09/25/24 - no ischemia with normal EF    Assessment & Plan:   Discussion: HPI Assessment & Plan Evaluation of dyspnea - improving with intervention Dyspnea post-fall with improvement. Normal chest x-rays and myocardial perfusion study. Differential includes age-related lung function decline or injury that seems to be resolving. Discussed pulmonary work-up including PFTs to rule out restrictive and obstructive defect (though less likely latter based on history). - Ordered pulmonary function test to assess lung function and airway issues. - Discuss  potential use of inhalers if pulmonary function test indicates abnormalities.   Health Maintenance Immunization History  Administered Date(s) Administered    sv, Bivalent, Protein Subunit Rsvpref,pf (Abrysvo) 08/23/2023   Fluad Quad(high Dose 65+) 08/17/2019, 08/12/2022, 08/30/2024   INFLUENZA, HIGH DOSE SEASONAL PF 09/23/2015, 09/14/2017, 08/25/2018, 08/23/2023   Influenza Split 09/02/2011, 10/01/2012, 10/01/2013, 09/12/2014   Influenza Whole 09/12/2008, 09/12/2009, 09/11/2010   Influenza-Unspecified 09/28/2016, 07/29/2018, 08/17/2019, 08/13/2020, 08/27/2021, 07/26/2022, 08/23/2023   PFIZER Comirnaty(Gray Top)Covid-19 Tri-Sucrose Vaccine 03/26/2021, 08/30/2024   PFIZER(Purple Top)SARS-COV-2 Vaccination 01/17/2020, 02/12/2020, 06/12/2020, 09/10/2020   Pfizer Covid-19 Vaccine Bivalent Booster 48yrs & up 08/25/2021, 07/26/2022   Pfizer(Comirnaty)Fall Seasonal Vaccine 12 years and older 03/02/2023, 08/23/2023   Pneumococcal Conjugate-13 01/11/2014   Pneumococcal Polysaccharide-23 12/13/2006   Td 03/25/1965, 12/14/1995, 03/13/2006   Tdap 01/19/2016   Typhoid Live 03/25/1965   Zoster Recombinant(Shingrix) 04/22/2017, 06/12/2017, 07/26/2017   Zoster, Live 12/13/2004   CT Lung Screen - not qualified  Orders Placed This Encounter  Procedures   Pulmonary function test    Standing Status:   Future    Expiration Date:   11/22/2025    Where should this test be performed?:   Outpatient Pulmonary    What type of PFT is being ordered?:   Full PFT w/o Spirometry post Bronchodilator  No orders of the defined types were placed in this encounter.   Return if symptoms worsen or fail to improve.  I have spent a total time of 30-minutes on the day of the appointment reviewing prior documentation, coordinating care and discussing medical diagnosis and plan with the patient/family. Imaging, labs and tests included in this note have been reviewed and interpreted independently by me. This note is  generated using [employer] programming. Patient/family has given consent.  Jake Goodson Slater Staff, MD [facility] 11/22/2024 11:09 AM        [1]  Allergies Allergen Reactions   Other     Seasonal hay  fever - sneezing, eyes watering   Codeine Sulfate Itching and Rash

## 2024-11-26 ENCOUNTER — Other Ambulatory Visit: Payer: Self-pay

## 2024-11-26 ENCOUNTER — Other Ambulatory Visit (HOSPITAL_BASED_OUTPATIENT_CLINIC_OR_DEPARTMENT_OTHER): Payer: Self-pay

## 2024-12-03 ENCOUNTER — Other Ambulatory Visit (HOSPITAL_BASED_OUTPATIENT_CLINIC_OR_DEPARTMENT_OTHER): Payer: Self-pay

## 2024-12-08 ENCOUNTER — Other Ambulatory Visit (HOSPITAL_BASED_OUTPATIENT_CLINIC_OR_DEPARTMENT_OTHER): Payer: Self-pay

## 2024-12-11 ENCOUNTER — Other Ambulatory Visit: Payer: Self-pay | Admitting: Family Medicine

## 2024-12-11 ENCOUNTER — Other Ambulatory Visit (HOSPITAL_BASED_OUTPATIENT_CLINIC_OR_DEPARTMENT_OTHER): Payer: Self-pay

## 2024-12-12 ENCOUNTER — Other Ambulatory Visit (HOSPITAL_BASED_OUTPATIENT_CLINIC_OR_DEPARTMENT_OTHER): Payer: Self-pay

## 2024-12-12 MED ORDER — ATORVASTATIN CALCIUM 20 MG PO TABS
20.0000 mg | ORAL_TABLET | Freq: Every day | ORAL | 0 refills | Status: AC
  Filled 2024-12-12: qty 90, 90d supply, fill #0
  Filled ????-??-??: fill #0

## 2024-12-12 MED ORDER — LOSARTAN POTASSIUM 100 MG PO TABS
100.0000 mg | ORAL_TABLET | Freq: Every day | ORAL | 0 refills | Status: AC
  Filled 2024-12-12: qty 90, 90d supply, fill #0
  Filled ????-??-??: fill #0

## 2024-12-17 ENCOUNTER — Other Ambulatory Visit (HOSPITAL_BASED_OUTPATIENT_CLINIC_OR_DEPARTMENT_OTHER): Payer: Self-pay

## 2024-12-19 ENCOUNTER — Other Ambulatory Visit (HOSPITAL_BASED_OUTPATIENT_CLINIC_OR_DEPARTMENT_OTHER): Payer: Self-pay

## 2024-12-20 ENCOUNTER — Ambulatory Visit (HOSPITAL_BASED_OUTPATIENT_CLINIC_OR_DEPARTMENT_OTHER): Payer: Self-pay | Admitting: Pulmonary Disease

## 2024-12-20 ENCOUNTER — Ambulatory Visit (INDEPENDENT_AMBULATORY_CARE_PROVIDER_SITE_OTHER)

## 2024-12-20 DIAGNOSIS — R0602 Shortness of breath: Secondary | ICD-10-CM

## 2024-12-20 LAB — PULMONARY FUNCTION TEST
DL/VA % pred: 106 %
DL/VA: 4.1 ml/min/mmHg/L
DLCO unc % pred: 98 %
DLCO unc: 23.83 ml/min/mmHg
FEF 25-75 Post: 2.87 L/s
FEF 25-75 Pre: 2.39 L/s
FEF2575-%Change-Post: 20 %
FEF2575-%Pred-Post: 153 %
FEF2575-%Pred-Pre: 127 %
FEV1-%Change-Post: 2 %
FEV1-%Pred-Post: 111 %
FEV1-%Pred-Pre: 108 %
FEV1-Post: 3.11 L
FEV1-Pre: 3.02 L
FEV1FVC-%Change-Post: 1 %
FEV1FVC-%Pred-Pre: 107 %
FEV6-%Change-Post: 0 %
FEV6-%Pred-Post: 105 %
FEV6-%Pred-Pre: 106 %
FEV6-Post: 3.88 L
FEV6-Pre: 3.91 L
FEV6FVC-%Change-Post: 0 %
FEV6FVC-%Pred-Post: 106 %
FEV6FVC-%Pred-Pre: 106 %
FVC-%Change-Post: 1 %
FVC-%Pred-Post: 101 %
FVC-%Pred-Pre: 100 %
FVC-Post: 4.01 L
FVC-Pre: 3.95 L
Post FEV1/FVC ratio: 78 %
Post FEV6/FVC ratio: 99 %
Pre FEV1/FVC ratio: 77 %
Pre FEV6/FVC Ratio: 99 %
RV % pred: 111 %
RV: 3 L
TLC % pred: 97 %
TLC: 6.89 L

## 2024-12-20 NOTE — Patient Instructions (Signed)
 Full PFT performed today.

## 2024-12-20 NOTE — Progress Notes (Signed)
 Full PFT performed today.

## 2025-03-11 ENCOUNTER — Encounter: Admitting: Family Medicine

## 2025-05-01 ENCOUNTER — Ambulatory Visit
